# Patient Record
Sex: Male | Born: 1965 | Race: White | Hispanic: No | Marital: Single | Smoking: Current every day smoker
Health system: Southern US, Community
[De-identification: ages and names within clinical notes are randomized; demographics above are authoritative.]

## PROBLEM LIST (undated history)

## (undated) DIAGNOSIS — G40909 Epilepsy, unspecified, not intractable, without status epilepticus: Secondary | ICD-10-CM

## (undated) DIAGNOSIS — M542 Cervicalgia: Secondary | ICD-10-CM

## (undated) DIAGNOSIS — I10 Essential (primary) hypertension: Secondary | ICD-10-CM

## (undated) DIAGNOSIS — F329 Major depressive disorder, single episode, unspecified: Secondary | ICD-10-CM

## (undated) DIAGNOSIS — R569 Unspecified convulsions: Secondary | ICD-10-CM

## (undated) DIAGNOSIS — K649 Unspecified hemorrhoids: Secondary | ICD-10-CM

## (undated) DIAGNOSIS — M1990 Unspecified osteoarthritis, unspecified site: Secondary | ICD-10-CM

## (undated) DIAGNOSIS — G6181 Chronic inflammatory demyelinating polyneuritis: Secondary | ICD-10-CM

## (undated) DIAGNOSIS — E785 Hyperlipidemia, unspecified: Secondary | ICD-10-CM

## (undated) DIAGNOSIS — K219 Gastro-esophageal reflux disease without esophagitis: Secondary | ICD-10-CM

## (undated) DIAGNOSIS — K635 Polyp of colon: Secondary | ICD-10-CM

## (undated) DIAGNOSIS — Z8619 Personal history of other infectious and parasitic diseases: Secondary | ICD-10-CM

## (undated) DIAGNOSIS — K7460 Unspecified cirrhosis of liver: Secondary | ICD-10-CM

## (undated) DIAGNOSIS — E7800 Pure hypercholesterolemia, unspecified: Secondary | ICD-10-CM

## (undated) DIAGNOSIS — D126 Benign neoplasm of colon, unspecified: Secondary | ICD-10-CM

## (undated) DIAGNOSIS — F419 Anxiety disorder, unspecified: Secondary | ICD-10-CM

## (undated) DIAGNOSIS — F4310 Post-traumatic stress disorder, unspecified: Secondary | ICD-10-CM

## (undated) DIAGNOSIS — B880 Other acariasis: Secondary | ICD-10-CM

## (undated) DIAGNOSIS — K625 Hemorrhage of anus and rectum: Secondary | ICD-10-CM

## (undated) DIAGNOSIS — M549 Dorsalgia, unspecified: Secondary | ICD-10-CM

## (undated) DIAGNOSIS — F32A Depression, unspecified: Secondary | ICD-10-CM

## (undated) HISTORY — DX: Unspecified osteoarthritis, unspecified site: M19.90

## (undated) HISTORY — DX: Unspecified hemorrhoids: K64.9

## (undated) HISTORY — DX: Pure hypercholesterolemia, unspecified: E78.00

## (undated) HISTORY — PX: OTHER SURGICAL HISTORY: SHX169

## (undated) HISTORY — DX: Unspecified cirrhosis of liver: K74.60

## (undated) HISTORY — PX: HERNIA REPAIR: SHX51

## (undated) HISTORY — DX: Gastro-esophageal reflux disease without esophagitis: K21.9

## (undated) HISTORY — DX: Benign neoplasm of colon, unspecified: D12.6

## (undated) HISTORY — DX: Hyperlipidemia, unspecified: E78.5

## (undated) HISTORY — DX: Post-traumatic stress disorder, unspecified: F43.10

## (undated) HISTORY — DX: Anxiety disorder, unspecified: F41.9

## (undated) HISTORY — DX: Polyp of colon: K63.5

## (undated) HISTORY — PX: TOTAL KNEE ARTHROPLASTY: SHX125

## (undated) HISTORY — DX: Essential (primary) hypertension: I10

## (undated) HISTORY — PX: ESOPHAGOGASTRODUODENOSCOPY: SHX1529

## (undated) HISTORY — DX: Epilepsy, unspecified, not intractable, without status epilepticus: G40.909

## (undated) HISTORY — PX: KNEE ARTHROSCOPY: SUR90

## (undated) HISTORY — DX: Personal history of other infectious and parasitic diseases: Z86.19

## (undated) HISTORY — DX: Chronic inflammatory demyelinating polyneuritis: G61.81

---

## 2009-06-28 ENCOUNTER — Emergency Department (HOSPITAL_COMMUNITY): Admission: EM | Admit: 2009-06-28 | Discharge: 2009-06-29 | Payer: Self-pay | Admitting: Emergency Medicine

## 2010-06-10 LAB — POCT I-STAT, CHEM 8
BUN: 13 mg/dL (ref 6–23)
Chloride: 107 mEq/L (ref 96–112)
Creatinine, Ser: 0.8 mg/dL (ref 0.4–1.5)
Glucose, Bld: 104 mg/dL — ABNORMAL HIGH (ref 70–99)
Hemoglobin: 15.3 g/dL (ref 13.0–17.0)
Potassium: 3.7 mEq/L (ref 3.5–5.1)
Sodium: 140 mEq/L (ref 135–145)
TCO2: 22 mmol/L (ref 0–100)

## 2012-01-04 ENCOUNTER — Encounter (HOSPITAL_COMMUNITY): Payer: Self-pay

## 2012-01-04 ENCOUNTER — Emergency Department (HOSPITAL_COMMUNITY)
Admission: EM | Admit: 2012-01-04 | Discharge: 2012-01-04 | Disposition: A | Discharge status: Home / Self Care | Payer: Self-pay | Attending: Emergency Medicine | Admitting: Emergency Medicine

## 2012-01-04 DIAGNOSIS — Z885 Allergy status to narcotic agent status: Secondary | ICD-10-CM | POA: Insufficient documentation

## 2012-01-04 DIAGNOSIS — M542 Cervicalgia: Secondary | ICD-10-CM | POA: Insufficient documentation

## 2012-01-04 DIAGNOSIS — F17200 Nicotine dependence, unspecified, uncomplicated: Secondary | ICD-10-CM | POA: Insufficient documentation

## 2012-01-04 DIAGNOSIS — B1920 Unspecified viral hepatitis C without hepatic coma: Secondary | ICD-10-CM | POA: Insufficient documentation

## 2012-01-04 DIAGNOSIS — R569 Unspecified convulsions: Secondary | ICD-10-CM | POA: Insufficient documentation

## 2012-01-04 DIAGNOSIS — M549 Dorsalgia, unspecified: Secondary | ICD-10-CM | POA: Insufficient documentation

## 2012-01-04 DIAGNOSIS — F3289 Other specified depressive episodes: Secondary | ICD-10-CM | POA: Insufficient documentation

## 2012-01-04 DIAGNOSIS — W010XXA Fall on same level from slipping, tripping and stumbling without subsequent striking against object, initial encounter: Secondary | ICD-10-CM | POA: Insufficient documentation

## 2012-01-04 DIAGNOSIS — F329 Major depressive disorder, single episode, unspecified: Secondary | ICD-10-CM | POA: Insufficient documentation

## 2012-01-04 HISTORY — DX: Dorsalgia, unspecified: M54.9

## 2012-01-04 HISTORY — DX: Other acariasis: B88.0

## 2012-01-04 HISTORY — DX: Unspecified convulsions: R56.9

## 2012-01-04 HISTORY — DX: Depression, unspecified: F32.A

## 2012-01-04 HISTORY — DX: Major depressive disorder, single episode, unspecified: F32.9

## 2012-01-04 HISTORY — DX: Cervicalgia: M54.2

## 2012-01-04 MED ORDER — METHOCARBAMOL 500 MG PO TABS: 1000.0000 mg | ORAL_TABLET | Freq: Four times a day (QID) | ORAL | Status: DC

## 2012-01-04 MED ORDER — HYDROCODONE-ACETAMINOPHEN 5-325 MG PO TABS: ORAL_TABLET | ORAL | Status: DC

## 2012-01-04 MED ORDER — NAPROXEN 500 MG PO TABS: 500.0000 mg | ORAL_TABLET | Freq: Two times a day (BID) | ORAL | Status: DC

## 2012-01-04 NOTE — ED Notes (Signed)
Pt presents with NAD- fell off porch 1 week ago - HX of neck and back pain-  No LOC- No new pain- increased chronic pain.  Denies numbness and tingling- GCS 15

## 2012-01-04 NOTE — ED Provider Notes (Signed)
History     CSN: [id number]  Arrival date & time 01/04/12  0904   First MD Initiated Contact with Patient 01/04/12 [phone]      Chief Complaint  Patient presents with  . Neck Pain  . Back Pain  . Fall    (Consider location/radiation/quality/duration/timing/severity/associated sxs/prior treatment) HPI Comments: Patient with h/o low back and neck problems, presents after fall 1-2 weeks ago. He slipped on a wet porch and landed on his back. C/o worsening neck and back pain since the fall. Has been trying heating pad and icy hot. Denies all red flag s/s of low back pain. No neuro deficits. Hurts to move, stand. Patient is ambulatory. Onset acute. Course is constant. Movement/walking makes sx worse. Nothing makes pain better.   Patient is a 46 y.o. male presenting with neck pain, back pain, and fall. The history is provided by the patient.  Neck Pain  Pertinent negatives include no numbness and no weakness.  Back Pain  Pertinent negatives include no fever, no numbness and no weakness.  Fall Pertinent negatives include no fever, no numbness and no hematuria.    Past Medical History  Diagnosis Date  . Neck pain   . Back pain   . Other acariasis     Hep C  . Seizures   . Depression     Past Surgical History  Procedure Date  . Joint replacement   . Hernia repair     No family history on file.  History  Substance Use Topics  . Smoking status: Current Every Day Smoker  . Smokeless tobacco: Not on file  . Alcohol Use: Yes      Review of Systems  Constitutional: Negative for fever and unexpected weight change.  HENT: Positive for neck pain. Negative for neck stiffness.   Gastrointestinal: Negative for constipation.       Neg for fecal incontinence  Genitourinary: Negative for hematuria, flank pain and difficulty urinating.       Negative for urinary incontinence or retention  Musculoskeletal: Positive for back pain.  Neurological: Negative for weakness and numbness.       Negative for saddle paresthesias     Allergies  Codeine  Home Medications   Current Outpatient Rx  Name Route Sig Dispense Refill  . DIVALPROEX SODIUM 500 MG PO TBEC Oral Take 500 mg by mouth 3 (three) times daily.    Marland Kitchen GABAPENTIN 100 MG PO CAPS Oral Take 100 mg by mouth 3 (three) times daily.    . ADULT MULTIVITAMIN W/MINERALS CH Oral Take 1 tablet by mouth daily.    . SERTRALINE HCL 50 MG PO TABS Oral Take 50 mg by mouth daily.    Marland Kitchen VITAMIN B-1 250 MG PO TABS Oral Take 250 mg by mouth daily.      BP 136/94  Pulse 76  Temp 98.2 F (36.8 C) (Oral)  Resp 18  Ht 5\' 9"  (1.753 m)  Wt 174 lb (78.926 kg)  BMI 25.70 kg/m2  SpO2 100%  Physical Exam  Nursing note and vitals reviewed. Constitutional: He appears well-developed and well-nourished.  HENT:  Head: Normocephalic and atraumatic.  Eyes: Conjunctivae normal are normal.  Neck: Normal range of motion.  Abdominal: Soft. There is no tenderness. There is no CVA tenderness.  Musculoskeletal: Normal range of motion. He exhibits no tenderness.       Cervical back: He exhibits tenderness. He exhibits normal range of motion and no bony tenderness.       Thoracic back:  Normal.       Lumbar back: He exhibits tenderness. He exhibits normal range of motion and no bony tenderness.       Back:       No step-off noted with palpation of spine.   Neurological: He is alert. He has normal reflexes. No sensory deficit. He exhibits normal muscle tone. Gait normal.       5/5 strength in entire lower extremities bilaterally. No sensation deficit.   Skin: Skin is warm and dry.  Psychiatric: He has a normal mood and affect.    ED Course  Procedures (including critical care time)  Labs Reviewed - No data to display No results found.   1. Back pain   2. Neck pain     10:12 AM Patient seen and examined.   Vital signs reviewed and are as follows: Filed Vitals:   01/04/12 0920  BP: 136/94  Pulse: 76  Temp: 98.2 F (36.8 C)    Resp: 18   No red flag s/s of low back pain. Patient was counseled on back pain precautions and told to do activity as tolerated but do not lift, push, or pull heavy objects more than 10 pounds for the next week.  Patient counseled to use ice or heat on back for no longer than 15 minutes every hour.   Patient prescribed muscle relaxer and counseled on proper use of muscle relaxant medication.    Patient prescribed narcotic pain medicine and counseled on proper use of narcotic pain medications. Counseled not to combine this medication with others containing tylenol.   Urged patient not to drink alcohol, drive, or perform any other activities that requires focus while taking either of these medications.  Patient urged to follow-up with PCP if pain does not improve with treatment and rest or if pain becomes recurrent. Urged to return with worsening severe pain, loss of bowel or bladder control, trouble walking.   The patient verbalizes understanding and agrees with the plan.  MDM  Patient with back pain. No neurological deficits. Patient is ambulatory. No warning symptoms of back pain including: loss of bowel or bladder control, night sweats, waking from sleep with back pain, unexplained fevers or weight loss, h/o cancer, IVDU, recent trauma. No upper or lower ext weakness/paresthesias to suggest radiculopathy. No concern for cauda equina, epidural abscess, or other serious cause of back/neck pain. Conservative measures such as rest, ice/heat and pain medicine indicated with PCP follow-up if no improvement with conservative management.         Renne Crigler, [place] 01/04/12 [phone]

## 2012-01-06 NOTE — ED Provider Notes (Signed)
Medical screening examination/treatment/procedure(s) were performed by non-physician practitioner and as supervising physician I was immediately available for consultation/collaboration.  Jenia Klepper R. Tomeka Kantner, MD 01/06/12 2355 

## 2012-07-25 ENCOUNTER — Encounter (HOSPITAL_COMMUNITY): Payer: Self-pay | Admitting: Emergency Medicine

## 2012-07-25 ENCOUNTER — Emergency Department (HOSPITAL_COMMUNITY)
Admission: EM | Admit: 2012-07-25 | Discharge: 2012-07-25 | Disposition: A | Discharge status: Home / Self Care | Payer: Self-pay | Attending: Emergency Medicine | Admitting: Emergency Medicine

## 2012-07-25 DIAGNOSIS — F329 Major depressive disorder, single episode, unspecified: Secondary | ICD-10-CM | POA: Insufficient documentation

## 2012-07-25 DIAGNOSIS — M542 Cervicalgia: Secondary | ICD-10-CM | POA: Insufficient documentation

## 2012-07-25 DIAGNOSIS — S39012A Strain of muscle, fascia and tendon of lower back, initial encounter: Secondary | ICD-10-CM

## 2012-07-25 DIAGNOSIS — Y939 Activity, unspecified: Secondary | ICD-10-CM | POA: Insufficient documentation

## 2012-07-25 DIAGNOSIS — Z8619 Personal history of other infectious and parasitic diseases: Secondary | ICD-10-CM | POA: Insufficient documentation

## 2012-07-25 DIAGNOSIS — F3289 Other specified depressive episodes: Secondary | ICD-10-CM | POA: Insufficient documentation

## 2012-07-25 DIAGNOSIS — S335XXA Sprain of ligaments of lumbar spine, initial encounter: Secondary | ICD-10-CM | POA: Insufficient documentation

## 2012-07-25 DIAGNOSIS — G8929 Other chronic pain: Secondary | ICD-10-CM | POA: Insufficient documentation

## 2012-07-25 DIAGNOSIS — F17200 Nicotine dependence, unspecified, uncomplicated: Secondary | ICD-10-CM | POA: Insufficient documentation

## 2012-07-25 DIAGNOSIS — Z79899 Other long term (current) drug therapy: Secondary | ICD-10-CM | POA: Insufficient documentation

## 2012-07-25 DIAGNOSIS — X58XXXA Exposure to other specified factors, initial encounter: Secondary | ICD-10-CM | POA: Insufficient documentation

## 2012-07-25 DIAGNOSIS — Y929 Unspecified place or not applicable: Secondary | ICD-10-CM | POA: Insufficient documentation

## 2012-07-25 DIAGNOSIS — G40909 Epilepsy, unspecified, not intractable, without status epilepticus: Secondary | ICD-10-CM | POA: Insufficient documentation

## 2012-07-25 MED ORDER — METHOCARBAMOL 500 MG PO TABS
500.0000 mg | ORAL_TABLET | Freq: Once | ORAL | Status: AC
  Administered 2012-07-25: 500 mg via ORAL
  Filled 2012-07-25: qty 1

## 2012-07-25 MED ORDER — HYDROCODONE-ACETAMINOPHEN 5-325 MG PO TABS: 1.0000 | ORAL_TABLET | ORAL | Status: DC | PRN

## 2012-07-25 MED ORDER — IBUPROFEN 600 MG PO TABS: 600.0000 mg | ORAL_TABLET | Freq: Four times a day (QID) | ORAL | Status: DC | PRN

## 2012-07-25 MED ORDER — METHOCARBAMOL 500 MG PO TABS: 500.0000 mg | ORAL_TABLET | Freq: Two times a day (BID) | ORAL | Status: DC

## 2012-07-25 MED ORDER — KETOROLAC TROMETHAMINE 60 MG/2ML IM SOLN
60.0000 mg | Freq: Once | INTRAMUSCULAR | Status: AC
  Administered 2012-07-25: 60 mg via INTRAMUSCULAR
  Filled 2012-07-25: qty 2

## 2012-07-25 MED ORDER — HYDROCODONE-ACETAMINOPHEN 5-325 MG PO TABS
1.0000 | ORAL_TABLET | Freq: Once | ORAL | Status: AC
  Administered 2012-07-25: 1 via ORAL
  Filled 2012-07-25: qty 1

## 2012-07-25 NOTE — ED Notes (Signed)
Patient  Called complaining of nausea after eating hamberger. Patient was informed to try clear liquids for the next 6 hours.

## 2012-07-25 NOTE — ED Provider Notes (Signed)
History     CSN: [id number]  Arrival date & time 07/25/12  0817   First MD Initiated Contact with Patient 07/25/12 0820      Chief Complaint  Patient presents with  . Back Pain    (Consider location/radiation/quality/duration/timing/severity/associated sxs/prior treatment) HPI Pt with long history of low back and neck pain p/w acute aggravation of current chronic pain x 2-3 days. No known trauma. No new weakness, numbness, fever, chills, bowel or bladder incontinence, IVD use. Pain is worse with movement and palpation. States he is taking tylenol for the pain.  Past Medical History  Diagnosis Date  . Neck pain   . Back pain   . Other acariasis     Hep C  . Seizures   . Depression     Past Surgical History  Procedure Laterality Date  . Joint replacement    . Hernia repair      No family history on file.  History  Substance Use Topics  . Smoking status: Current Every Day Smoker -- 0.05 packs/day for 40 years  . Smokeless tobacco: Not on file  . Alcohol Use: Yes      Review of Systems  Constitutional: Negative for fever and chills.  HENT: Negative for neck pain.   Respiratory: Negative for shortness of breath.   Cardiovascular: Negative for chest pain.  Gastrointestinal: Negative for nausea, vomiting and abdominal pain.  Genitourinary: Negative for frequency, flank pain and difficulty urinating.  Musculoskeletal: Positive for myalgias and back pain.  Skin: Negative for rash and wound.  Neurological: Negative for dizziness, weakness, light-headedness, numbness and headaches.  All other systems reviewed and are negative.    Allergies  Codeine  Home Medications   Current Outpatient Rx  Name  Route  Sig  Dispense  Refill  . divalproex (DEPAKOTE ER) 500 MG 24 hr tablet   Oral   Take 500 mg by mouth 3 (three) times daily.         Marland Kitchen gabapentin (NEURONTIN) 300 MG capsule   Oral   Take 300 mg by mouth 3 (three) times daily.         . Multiple Vitamin  (MULTIVITAMIN WITH MINERALS) TABS   Oral   Take 1 tablet by mouth daily.         . sertraline (ZOLOFT) 100 MG tablet   Oral   Take 100 mg by mouth daily.         . Thiamine HCl (VITAMIN B-1) 250 MG tablet   Oral   Take 250 mg by mouth daily.         . traZODone (DESYREL) 100 MG tablet   Oral   Take 100 mg by mouth at bedtime as needed for sleep.         Marland Kitchen HYDROcodone-acetaminophen (NORCO) 5-325 MG per tablet   Oral   Take 1 tablet by mouth every 4 (four) hours as needed for pain.   10 tablet   0   . ibuprofen (ADVIL,MOTRIN) 600 MG tablet   Oral   Take 1 tablet (600 mg total) by mouth every 6 (six) hours as needed for pain.   30 tablet   0   . methocarbamol (ROBAXIN) 500 MG tablet   Oral   Take 1 tablet (500 mg total) by mouth 2 (two) times daily.   20 tablet   0     BP 118/83  Pulse 84  Temp(Src) 98.3 F (36.8 C) (Oral)  Resp 19  SpO2 98%  Physical  Exam  Nursing note and vitals reviewed. Constitutional: He is oriented to person, place, and time. He appears well-developed and well-nourished. No distress.  HENT:  Head: Normocephalic and atraumatic.  Mouth/Throat: Oropharynx is clear and moist.  Eyes: EOM are normal. Pupils are equal, round, and reactive to light.  Neck: Normal range of motion. Neck supple.  Cardiovascular: Normal rate and regular rhythm.   Pulmonary/Chest: Effort normal and breath sounds normal. No respiratory distress. He has no wheezes. He has no rales. He exhibits no tenderness.  Abdominal: Soft. Bowel sounds are normal. He exhibits no distension and no mass. There is no tenderness. There is no rebound and no guarding.  Musculoskeletal: Normal range of motion. He exhibits tenderness. He exhibits no edema.       Arms: Tenderness to palpation. No stepoff. No cervical midline tenderness. Negative straight leg raise  Neurological: He is alert and oriented to person, place, and time.  Skin: Skin is warm and dry. No rash noted. No  erythema.  Psychiatric: He has a normal mood and affect. His behavior is normal.    ED Course  Procedures (including critical care time)  Labs Reviewed - No data to display No results found.   1. Lumbar strain, initial encounter       MDM  No concerning finding on history or physical.         Loren Racer, MD 07/25/12 [phone]

## 2012-07-25 NOTE — ED Notes (Signed)
Patient reports that the day before yesterday when the pollen started he began coughing and felt like he "locked up," meaning his muscles tightened and his spine and pelvis began to hurt badly.

## 2012-10-10 ENCOUNTER — Emergency Department (INDEPENDENT_AMBULATORY_CARE_PROVIDER_SITE_OTHER)
Admission: EM | Admit: 2012-10-10 | Discharge: 2012-10-10 | Disposition: A | Discharge status: Home / Self Care | Payer: Self-pay | Source: Home / Self Care

## 2012-10-10 ENCOUNTER — Encounter (HOSPITAL_COMMUNITY): Payer: Self-pay | Admitting: *Deleted

## 2012-10-10 DIAGNOSIS — G8929 Other chronic pain: Secondary | ICD-10-CM

## 2012-10-10 DIAGNOSIS — R52 Pain, unspecified: Secondary | ICD-10-CM

## 2012-10-10 DIAGNOSIS — M549 Dorsalgia, unspecified: Secondary | ICD-10-CM

## 2012-10-10 MED ORDER — KETOROLAC TROMETHAMINE 10 MG PO TABS: 10.0000 mg | ORAL_TABLET | Freq: Four times a day (QID) | ORAL | Status: DC | PRN

## 2012-10-10 MED ORDER — CYCLOBENZAPRINE HCL 5 MG PO TABS: 5.0000 mg | ORAL_TABLET | Freq: Three times a day (TID) | ORAL | Status: DC | PRN
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
## 2012-10-10 NOTE — ED Notes (Signed)
Pt  Reports  Neck  And  Back  Pain      For  What  He  Describes    As     Years   Ago        He  denys  A  Recent injury       He       Reports     Seen in  Er    About  1  Month  Ago     For    For  He  Reports  Was  Told  Was  A  Muscle  Sprain       He  Is sitting  Upright on  Exam table    Speaking   In  Complete  sentances      He  Ambulated to  Room with a  Steady  Fluid  Gait

## 2012-10-31 ENCOUNTER — Ambulatory Visit: Payer: Self-pay | Attending: Family Medicine | Admitting: Internal Medicine

## 2012-10-31 VITALS — BP 142/96 | HR 80 | Temp 98.8°F | Ht 69.0 in | Wt 191.8 lb

## 2012-10-31 DIAGNOSIS — M542 Cervicalgia: Secondary | ICD-10-CM | POA: Insufficient documentation

## 2012-10-31 DIAGNOSIS — M545 Low back pain, unspecified: Secondary | ICD-10-CM | POA: Insufficient documentation

## 2012-10-31 DIAGNOSIS — G8929 Other chronic pain: Secondary | ICD-10-CM | POA: Insufficient documentation

## 2012-11-01 ENCOUNTER — Encounter: Payer: Self-pay | Admitting: Internal Medicine

## 2012-11-01 NOTE — Progress Notes (Signed)
Patient ID: Cody Delacruz, male   DOB: 1965-10-01, 47 y.o.   MRN: [id number]  CC: To establish care  HPI: Patient was seen in the clinic today to establish medical care, he complained of severe neck pain and low back pain she has been going on for years, did describe to the neck pain as cracking, and some sometimes he had to fix the neck by turning it sideways onto he hears the crack sound and then he feels relief. Low back pain has also been several years, has had many therapies in the past, no tingling or symptoms of nerve damage.  Past medical history includes seizure disorder, depression and hepatitis C. 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 He is being managed by psychiatrist, and had seen a chiropractor before.   Allergies  Allergen Reactions  . Codeine Other (See Comments)    Unknown childhood allergy   Past Medical History  Diagnosis Date  . Neck pain   . Back pain   . Other acariasis     Hep C  . Seizures   . Depression    Current Outpatient Prescriptions on File Prior to Visit  Medication Sig Dispense Refill  . divalproex (DEPAKOTE ER) 500 MG 24 hr tablet Take 500 mg by mouth 3 (three) times daily.      . Multiple Vitamin (MULTIVITAMIN WITH MINERALS) TABS Take 1 tablet by mouth daily.      . sertraline (ZOLOFT) 100 MG tablet Take 100 mg by mouth daily.      . Thiamine HCl (VITAMIN B-1) 250 MG tablet Take 250 mg by mouth daily.      . traZODone (DESYREL) 100 MG tablet Take 100 mg by mouth at bedtime as needed for sleep.      . cyclobenzaprine (FLEXERIL) 5 MG tablet Take 1 tablet (5 mg total) by mouth 3 (three) times daily as needed for muscle spasms.  30 tablet  0  . gabapentin (NEURONTIN) 300 MG capsule Take 300 mg by mouth 3 (three) times daily.      Marland Kitchen HYDROcodone-acetaminophen (NORCO) 5-325 MG per tablet Take 1 tablet by mouth every 4 (four) hours as  needed for pain.  10 tablet  0  . ibuprofen (ADVIL,MOTRIN) 600 MG tablet Take 1 tablet (600 mg total) by mouth every 6 (six) hours as needed for pain.  30 tablet  0  . ketorolac (TORADOL) 10 MG tablet Take 1 tablet (10 mg total) by mouth every 6 (six) hours as needed for pain.  30 tablet  0  . methocarbamol (ROBAXIN) 500 MG tablet Take 1 tablet (500 mg total) by mouth 2 (two) times daily.  20 tablet  0   No current facility-administered medications on file prior to visit.   Family History  Problem Relation Age of Onset  . Cancer Mother   . Cancer Father   . Cancer Sister   . Cancer Brother   . Cancer Maternal Grandmother    History   Social History  . Marital Status: Single    Spouse Name: N/A    Number of Children: N/A  . Years of Education: N/A   Occupational History  . Not on file.   Social History Main Topics  . Smoking status: Current Every Day Smoker -- 0.05 packs/day for 40 years  . Smokeless tobacco: Not on file  . Alcohol Use:  Yes  . Drug Use: No  . Sexual Activity: Not on file   Other Topics Concern  . Not on file   Social History Narrative  . No narrative on file    Review of Systems: Constitutional: Negative for fever, chills, diaphoresis, activity change, appetite change and fatigue. HENT: Negative for ear pain, nosebleeds, congestion, facial swelling, rhinorrhea, neck pain, neck stiffness and ear discharge.  Eyes: Negative for pain, discharge, redness, itching and visual disturbance. Respiratory: Negative for cough, choking, chest tightness, shortness of breath, wheezing and stridor.  Cardiovascular: Negative for chest pain, palpitations and leg swelling. Gastrointestinal: Negative for abdominal distention. Genitourinary: Negative for dysuria, urgency, frequency, hematuria, flank pain, decreased urine volume, difficulty urinating and dyspareunia.  Musculoskeletal: No joint swelling or gait problem. Neurological: Negative for dizziness, tremors, seizures,  syncope, facial asymmetry, speech difficulty, weakness, light-headedness, numbness and headaches.  Hematological: Negative for adenopathy. Does not bruise/bleed easily. Psychiatric/Behavioral: Negative for hallucinations, behavioral problems, confusion, dysphoric mood, decreased concentration and agitation.    Objective:   Filed Vitals:   10/31/12 1659  BP: 142/96  Pulse: 80  Temp: 98.8 F (37.1 C)    Physical Exam: Constitutional: Patient appears well-developed and well-nourished. No distress. HENT: Normocephalic, atraumatic, External right and left ear normal. Oropharynx is clear and moist.  Eyes: Conjunctivae and EOM are normal. PERRLA, no scleral icterus. Neck: Point tenderness on the nape of neck, reduced range of motion dur to pain. No JVD. No tracheal deviation. No thyromegaly. CVS: RRR, S1/S2 +, no murmurs, no gallops, no carotid bruit.  Pulmonary: Effort and breath sounds normal, no stridor, rhonchi, wheezes, rales.  Abdominal: Soft. BS +,  no distension, tenderness, rebound or guarding.  Musculoskeletal: Normal range of motion. No edema and no tenderness.  Lymphadenopathy: No lymphadenopathy noted, cervical, inguinal or axillary Neuro: Alert. Normal reflexes, muscle tone coordination. No cranial nerve deficit. Skin: Skin is warm and dry. No rash noted. Not diaphoretic. No erythema. No pallor. Psychiatric: Normal mood and affect. Behavior, judgment, thought content normal.  Lab Results  Component Value Date   HGB 15.3 06/28/2009   HCT 45.0 06/28/2009   Lab Results  Component Value Date   CREATININE 0.8 06/28/2009   BUN 13 06/28/2009   NA 140 06/28/2009   K 3.7 06/28/2009   CL 107 06/28/2009    No results found for this basename: HGBA1C   Lipid Panel  No results found for this basename: chol, trig, hdl, cholhdl, vldl, ldlcalc       Assessment and plan:   Patient Active Problem List   Diagnosis Date Noted  . Neck pain, chronic 10/31/2012  . Low back pain 10/31/2012    X-ray of the cervical spine X-ray of the lumbosacral spine  Patient to alternate pain medication already prescribed We'll review the x-ray report and subsequent management will depend on the findings Patient may need physical therapy Smoking cessation counseling was done today  Dempsy Damiano was given clear instructions to go to ER or return to the clinic if symptoms don't improve, worsen or new problems develop.  Zeev Bartok verbalized understanding.  Pepe Mineau was told to call to get lab results if hasn't heard anything in the next week.        Jeanann Lewandowsky, MD [facility] [place], [place] [phone]   11/01/2012, 9:04 AM

## 2012-11-14 ENCOUNTER — Ambulatory Visit (HOSPITAL_COMMUNITY)
Admission: RE | Admit: 2012-11-14 | Discharge: 2012-11-14 | Disposition: A | Discharge status: Home / Self Care | Payer: Self-pay | Source: Ambulatory Visit | Attending: Internal Medicine | Admitting: Internal Medicine

## 2012-11-14 DIAGNOSIS — G8929 Other chronic pain: Secondary | ICD-10-CM | POA: Insufficient documentation

## 2012-11-14 DIAGNOSIS — M545 Low back pain, unspecified: Secondary | ICD-10-CM | POA: Insufficient documentation

## 2012-11-14 DIAGNOSIS — M542 Cervicalgia: Secondary | ICD-10-CM | POA: Insufficient documentation

## 2012-12-01 ENCOUNTER — Encounter: Payer: Self-pay | Admitting: Internal Medicine

## 2012-12-01 ENCOUNTER — Ambulatory Visit: Payer: No Typology Code available for payment source | Attending: Internal Medicine | Admitting: Internal Medicine

## 2012-12-01 ENCOUNTER — Ambulatory Visit: Payer: No Typology Code available for payment source | Attending: Internal Medicine

## 2012-12-01 ENCOUNTER — Other Ambulatory Visit: Payer: Self-pay | Admitting: Internal Medicine

## 2012-12-01 VITALS — BP 152/96 | HR 78 | Temp 99.3°F | Resp 16 | Ht 69.69 in | Wt 192.0 lb

## 2012-12-01 DIAGNOSIS — M542 Cervicalgia: Secondary | ICD-10-CM | POA: Insufficient documentation

## 2012-12-01 DIAGNOSIS — M545 Low back pain, unspecified: Secondary | ICD-10-CM | POA: Insufficient documentation

## 2012-12-01 DIAGNOSIS — G8929 Other chronic pain: Secondary | ICD-10-CM

## 2012-12-01 MED ORDER — TRAZODONE HCL 100 MG PO TABS
100.0000 mg | ORAL_TABLET | Freq: Every day | ORAL | Status: DC
Start: 2012-12-01 — End: 2013-03-02

## 2012-12-01 MED ORDER — IBUPROFEN 600 MG PO TABS: 600.0000 mg | ORAL_TABLET | Freq: Four times a day (QID) | ORAL | Status: DC | PRN

## 2012-12-01 MED ORDER — IBUPROFEN 800 MG PO TABS: 800.0000 mg | ORAL_TABLET | Freq: Four times a day (QID) | ORAL | Status: DC | PRN

## 2012-12-01 MED ORDER — METHOCARBAMOL 500 MG PO TABS: 500.0000 mg | ORAL_TABLET | Freq: Two times a day (BID) | ORAL | Status: DC

## 2012-12-01 MED ORDER — CYCLOBENZAPRINE HCL 5 MG PO TABS: 5.0000 mg | ORAL_TABLET | Freq: Three times a day (TID) | ORAL | Status: DC | PRN

## 2012-12-01 NOTE — Progress Notes (Signed)
Patient ID: Cody Delacruz, male   DOB: [birth year], 47 y.o.   MRN: [id number] Patient Demographics  Cody Delacruz, is a 47 y.o. male  [id number]  [id number]  DOB - 1965-05-23  Chief Complaint  Patient presents with  . Follow-up        Subjective:   Cody Delacruz is a 47 y.o. male here today for a follow up visit. He wants to know the results of the x-rays and also to get a refill in view of his medications. No new complaints. Still has neck pain with a cracked sound. Patient has No headache, No chest pain, No abdominal pain - No Nausea, No new weakness tingling or numbness, No Cough - SOB.  ALLERGIES:   Allergies  Allergen Reactions  . Codeine Other (See Comments)    Unknown childhood allergy    PAST MEDICAL HISTORY: Past Medical History  Diagnosis Date  . Neck pain   . Back pain   . Other acariasis     Hep C  . Seizures   . Depression     MEDICATIONS AT HOME: Prior to Admission medications   Medication Sig Start Date End Date Taking? Authorizing Provider  divalproex (DEPAKOTE ER) 500 MG 24 hr tablet Take 500 mg by mouth 3 (three) times daily.   Yes Historical Provider, MD  sertraline (ZOLOFT) 100 MG tablet Take 100 mg by mouth daily.   Yes Historical Provider, MD  traZODone (DESYREL) 100 MG tablet Take 1 tablet (100 mg total) by mouth at bedtime. 12/01/12  Yes Jeanann Lewandowsky, MD  cyclobenzaprine (FLEXERIL) 5 MG tablet Take 1 tablet (5 mg total) by mouth 3 (three) times daily as needed for muscle spasms. 12/01/12   Jeanann Lewandowsky, MD  gabapentin (NEURONTIN) 300 MG capsule Take 300 mg by mouth 3 (three) times daily.    Historical Provider, MD  HYDROcodone-acetaminophen (NORCO) 5-325 MG per tablet Take 1 tablet by mouth every 4 (four) hours as needed for pain. 07/25/12   Loren Racer, MD  ibuprofen (ADVIL,MOTRIN) 600 MG tablet Take 1 tablet (600 mg total) by mouth every 6 (six) hours as needed for pain. 12/01/12   Jeanann Lewandowsky, MD  ketorolac (TORADOL) 10  MG tablet Take 1 tablet (10 mg total) by mouth every 6 (six) hours as needed for pain. 10/10/12   Linna Hoff, MD  methocarbamol (ROBAXIN) 500 MG tablet Take 1 tablet (500 mg total) by mouth 2 (two) times daily. 12/01/12   Jeanann Lewandowsky, MD  Multiple Vitamin (MULTIVITAMIN WITH MINERALS) TABS Take 1 tablet by mouth daily.    Historical Provider, MD  Thiamine HCl (VITAMIN B-1) 250 MG tablet Take 250 mg by mouth daily.    Historical Provider, MD     Objective:   Filed Vitals:   12/01/12 1627  BP: 152/96  Pulse: 78  Temp: 99.3 F (37.4 C)  TempSrc: Oral  Resp: 16  Height: 5' 9.69" (1.77 m)  Weight: 192 lb (87.091 kg)  SpO2: 97%    Exam General appearance :Awake, alert, not in any distress. Speech Clear. Not toxic Looking HEENT: Atraumatic and Normocephalic, pupils equally reactive to light and accomodation Neck: supple, no JVD. No cervical lymphadenopathy.  Chest:Good air entry bilaterally, no added sounds  CVS: S1 S2 regular, no murmurs.  Abdomen: Bowel sounds present, Non tender and not distended with no gaurding, rigidity or rebound. Extremities: B/L Lower Ext shows no edema, both legs are warm to touch Neurology: Awake alert, and oriented X 3, CN II-XII intact, Non focal  Skin:No Rash Wounds:N/A   Data Review   CBC No results found for this basename: WBC, HGB, HCT, PLT, MCV, MCH, MCHC, RDW, NEUTRABS, LYMPHSABS, MONOABS, EOSABS, BASOSABS, BANDABS, BANDSABD,  in the last 168 hours  Chemistries   No results found for this basename: NA, K, CL, CO2, GLUCOSE, BUN, CREATININE, GFRCGP, CALCIUM, MG, AST, ALT, ALKPHOS, BILITOT,  in the last 168 hours ------------------------------------------------------------------------------------------------------------------ No results found for this basename: HGBA1C,  in the last 72 hours ------------------------------------------------------------------------------------------------------------------ No results found for this basename:  CHOL, HDL, LDLCALC, TRIG, CHOLHDL, LDLDIRECT,  in the last 72 hours ------------------------------------------------------------------------------------------------------------------ No results found for this basename: TSH, T4TOTAL, FREET3, T3FREE, THYROIDAB,  in the last 72 hours ------------------------------------------------------------------------------------------------------------------ No results found for this basename: VITAMINB12, FOLATE, FERRITIN, TIBC, IRON, RETICCTPCT,  in the last 72 hours  Coagulation profile  No results found for this basename: INR, PROTIME,  in the last 168 hours    Assessment & Plan   Patient Active Problem List   Diagnosis Date Noted  . Neck pain, chronic 10/31/2012  . Low back pain 10/31/2012     Plan: X-ray cervical and lumbar spines reviewed and discussed with patient Patient was counseled about pain  Refilled trazodone, ibuprofen, Robaxin and Flexeril Patient claims to have other medications including anti-seizure Patient encouraged to have ambulatory blood pressure check and keep a log for next appointment as he claims his blood pressure is usually with normal except when in pain like now   Follow up in 2 months   The patient was given clear instructions to go to ER or return to medical center if symptoms don't improve, worsen or new problems develop. The patient verbalized understanding. The patient was told to call to get lab results if they haven't heard anything in the next week.    Jeanann Lewandowsky, MD, MHA, FACP [facility] [place], [place] [phone]   12/01/2012, 4:50 PM

## 2012-12-01 NOTE — Progress Notes (Signed)
Pt is here for a F/U visit. Pt is here to review his  X ray results Pt still has the neck and back pain. 8 on the pain scale, sharp, stabbing, no changes. Pt also request a physical.

## 2012-12-13 ENCOUNTER — Ambulatory Visit: Payer: No Typology Code available for payment source | Attending: Internal Medicine

## 2012-12-13 DIAGNOSIS — IMO0001 Reserved for inherently not codable concepts without codable children: Secondary | ICD-10-CM | POA: Insufficient documentation

## 2012-12-13 DIAGNOSIS — R293 Abnormal posture: Secondary | ICD-10-CM | POA: Insufficient documentation

## 2012-12-13 DIAGNOSIS — R5381 Other malaise: Secondary | ICD-10-CM | POA: Insufficient documentation

## 2012-12-13 DIAGNOSIS — M2550 Pain in unspecified joint: Secondary | ICD-10-CM | POA: Insufficient documentation

## 2012-12-13 DIAGNOSIS — M2560 Stiffness of unspecified joint, not elsewhere classified: Secondary | ICD-10-CM | POA: Insufficient documentation

## 2012-12-15 ENCOUNTER — Ambulatory Visit: Payer: No Typology Code available for payment source

## 2012-12-19 ENCOUNTER — Ambulatory Visit: Payer: No Typology Code available for payment source

## 2012-12-20 ENCOUNTER — Ambulatory Visit: Payer: No Typology Code available for payment source | Attending: Internal Medicine | Admitting: Rehabilitation

## 2012-12-20 DIAGNOSIS — R293 Abnormal posture: Secondary | ICD-10-CM | POA: Insufficient documentation

## 2012-12-20 DIAGNOSIS — M2550 Pain in unspecified joint: Secondary | ICD-10-CM | POA: Insufficient documentation

## 2012-12-20 DIAGNOSIS — M2560 Stiffness of unspecified joint, not elsewhere classified: Secondary | ICD-10-CM | POA: Insufficient documentation

## 2012-12-20 DIAGNOSIS — R5381 Other malaise: Secondary | ICD-10-CM | POA: Insufficient documentation

## 2012-12-20 DIAGNOSIS — IMO0001 Reserved for inherently not codable concepts without codable children: Secondary | ICD-10-CM | POA: Insufficient documentation

## 2012-12-26 ENCOUNTER — Ambulatory Visit: Payer: No Typology Code available for payment source

## 2012-12-27 ENCOUNTER — Ambulatory Visit: Payer: No Typology Code available for payment source

## 2013-01-03 ENCOUNTER — Ambulatory Visit: Payer: No Typology Code available for payment source

## 2013-01-05 ENCOUNTER — Ambulatory Visit: Payer: No Typology Code available for payment source

## 2013-01-09 ENCOUNTER — Ambulatory Visit: Payer: No Typology Code available for payment source | Admitting: Rehabilitation

## 2013-01-11 ENCOUNTER — Ambulatory Visit: Payer: No Typology Code available for payment source

## 2013-01-16 ENCOUNTER — Ambulatory Visit: Payer: No Typology Code available for payment source

## 2013-01-18 ENCOUNTER — Ambulatory Visit: Payer: No Typology Code available for payment source

## 2013-01-23 ENCOUNTER — Ambulatory Visit: Payer: No Typology Code available for payment source | Attending: Internal Medicine

## 2013-01-23 DIAGNOSIS — R293 Abnormal posture: Secondary | ICD-10-CM | POA: Insufficient documentation

## 2013-01-23 DIAGNOSIS — M2550 Pain in unspecified joint: Secondary | ICD-10-CM | POA: Insufficient documentation

## 2013-01-23 DIAGNOSIS — R5381 Other malaise: Secondary | ICD-10-CM | POA: Insufficient documentation

## 2013-01-23 DIAGNOSIS — M2560 Stiffness of unspecified joint, not elsewhere classified: Secondary | ICD-10-CM | POA: Insufficient documentation

## 2013-01-23 DIAGNOSIS — IMO0001 Reserved for inherently not codable concepts without codable children: Secondary | ICD-10-CM | POA: Insufficient documentation

## 2013-01-25 ENCOUNTER — Ambulatory Visit: Payer: No Typology Code available for payment source

## 2013-01-30 ENCOUNTER — Ambulatory Visit: Payer: No Typology Code available for payment source

## 2013-02-01 ENCOUNTER — Ambulatory Visit: Payer: No Typology Code available for payment source | Admitting: Rehabilitation

## 2013-02-07 ENCOUNTER — Ambulatory Visit: Payer: No Typology Code available for payment source

## 2013-02-09 ENCOUNTER — Ambulatory Visit: Payer: No Typology Code available for payment source

## 2013-03-02 ENCOUNTER — Ambulatory Visit: Payer: No Typology Code available for payment source | Attending: Internal Medicine | Admitting: Internal Medicine

## 2013-03-02 ENCOUNTER — Encounter: Payer: Self-pay | Admitting: Internal Medicine

## 2013-03-02 VITALS — BP 153/100 | HR 78 | Temp 98.1°F | Resp 18 | Ht 69.0 in | Wt 203.0 lb

## 2013-03-02 DIAGNOSIS — G8929 Other chronic pain: Secondary | ICD-10-CM

## 2013-03-02 DIAGNOSIS — K219 Gastro-esophageal reflux disease without esophagitis: Secondary | ICD-10-CM

## 2013-03-02 DIAGNOSIS — Z23 Encounter for immunization: Secondary | ICD-10-CM

## 2013-03-02 DIAGNOSIS — M545 Low back pain: Secondary | ICD-10-CM

## 2013-03-02 MED ORDER — TRAZODONE HCL 100 MG PO TABS
100.0000 mg | ORAL_TABLET | Freq: Every day | ORAL | Status: DC
Start: 2013-03-02 — End: 2016-05-19

## 2013-03-02 MED ORDER — GABAPENTIN 300 MG PO CAPS: 300.0000 mg | ORAL_CAPSULE | Freq: Three times a day (TID) | ORAL | Status: DC

## 2013-03-02 MED ORDER — DIVALPROEX SODIUM ER 500 MG PO TB24: 500.0000 mg | ORAL_TABLET | Freq: Three times a day (TID) | ORAL | Status: DC

## 2013-03-02 MED ORDER — KETOROLAC TROMETHAMINE 10 MG PO TABS: 10.0000 mg | ORAL_TABLET | Freq: Four times a day (QID) | ORAL | Status: DC | PRN

## 2013-03-02 MED ORDER — METHOCARBAMOL 500 MG PO TABS: 500.0000 mg | ORAL_TABLET | Freq: Two times a day (BID) | ORAL | Status: DC

## 2013-03-02 MED ORDER — OMEPRAZOLE 40 MG PO CPDR: 40.0000 mg | DELAYED_RELEASE_CAPSULE | Freq: Every day | ORAL | Status: DC

## 2013-03-02 NOTE — Progress Notes (Unsigned)
Pt here to f/u chronic lower back/neck pain Need medication refill Unable to afford $4.00 meds Chronic pain C/o vomiting intermit after eating and lying down x 4 mnths

## 2013-03-02 NOTE — Progress Notes (Unsigned)
Patient ID: Cody Delacruz, male   DOB: [birth year], 47 y.o.   MRN: [id number] Subjective: ***  Objective:  Vital signs in last 24 hours:  Filed Vitals:   03/02/13 1747  BP: 153/100  Pulse: 78  Temp: 98.1 F (36.7 C)  TempSrc: Oral  Resp: 18  Height: 5\' 9"  (1.753 m)  Weight: 203 lb (92.08 kg)  SpO2: 96%    Physical Exam:  General: Middle aged male in no acute distress. HEENT: no pallor, no icterus, moist oral mucosa,  Heart: Normal  s1 &s2  Regular rate and rhythm, without murmurs, rubs, gallops. Lungs: Clear to auscultation bilaterally. Abdomen: Soft, nontender, nondistended, positive bowel sounds. Extremities: tender to Palpation over cervical and lower lumbar area Neuro: Alert, awake, oriented x3, nonfocal.   Lab Results:  Basic Metabolic Panel:    Component Value Date/Time   NA 140 06/28/2009 2150   K 3.7 06/28/2009 2150   CL 107 06/28/2009 2150   BUN 13 06/28/2009 2150   CREATININE 0.8 06/28/2009 2150   GLUCOSE 104* 06/28/2009 2150   CBC:    Component Value Date/Time   HGB 15.3 06/28/2009 2150   HCT 45.0 06/28/2009 2150    No results found for this or any previous visit (from the past 240 hour(s)).  Studies/Results: No results found.  Medications: Scheduled Meds: Continuous Infusions: PRN Meds:.    Assessment/Plan:  Chronic neck and low back pain  Patient is and he worked with physical therapy. If continues to have pain. I will refill his pain medication prescriptions Encouraged on range of motion exercises.  GERD Patient reports history of gastritis and gastropathy with hiatal hernia as seen on his EGD report from 2011. Patient has symptoms all acid reflux and vomiting of yellowish liquid material in the night. He also feels sensation of food being stuck in the esophagus. History of H pylori that was treated in [place]  in 2011 Will prescribe him with omeprazole 40 mg daily. Refer to out patient GI Follow  up in 3 month  Cody Delacruz 03/02/2013, 6:14  PM

## 2013-03-05 ENCOUNTER — Ambulatory Visit: Payer: No Typology Code available for payment source | Attending: Internal Medicine

## 2013-04-20 ENCOUNTER — Telehealth: Payer: Self-pay | Admitting: Emergency Medicine

## 2013-04-20 NOTE — Telephone Encounter (Signed)
Pt called in c/o continued neck and back pain unrelieved by pain medication. States sx are worsening in left leg numbness and right leg constant pain with lying down radiating to groin area x 2weeks. Pt has been seen by PT Was told he may need MRI Please f/u

## 2013-04-30 ENCOUNTER — Telehealth: Payer: Self-pay | Admitting: Internal Medicine

## 2013-04-30 NOTE — Telephone Encounter (Signed)
Pt is calling in today having trouble with his legs; pt has mentioned some numbness and that this sensation is travelling; pt would like to have a nurse call him to advise him on wether or not to go to the ED; please f/u with pt

## 2013-05-01 ENCOUNTER — Encounter: Payer: Self-pay | Admitting: Internal Medicine

## 2013-05-01 ENCOUNTER — Ambulatory Visit: Payer: No Typology Code available for payment source | Attending: Internal Medicine | Admitting: Internal Medicine

## 2013-05-01 VITALS — BP 137/88 | HR 84 | Temp 98.4°F | Resp 16 | Ht 69.0 in | Wt 199.0 lb

## 2013-05-01 DIAGNOSIS — Z760 Encounter for issue of repeat prescription: Secondary | ICD-10-CM | POA: Insufficient documentation

## 2013-05-01 DIAGNOSIS — M542 Cervicalgia: Secondary | ICD-10-CM | POA: Insufficient documentation

## 2013-05-01 DIAGNOSIS — M545 Low back pain, unspecified: Secondary | ICD-10-CM

## 2013-05-01 DIAGNOSIS — R569 Unspecified convulsions: Secondary | ICD-10-CM | POA: Insufficient documentation

## 2013-05-01 DIAGNOSIS — M549 Dorsalgia, unspecified: Secondary | ICD-10-CM | POA: Insufficient documentation

## 2013-05-01 DIAGNOSIS — G40909 Epilepsy, unspecified, not intractable, without status epilepticus: Secondary | ICD-10-CM

## 2013-05-01 DIAGNOSIS — K219 Gastro-esophageal reflux disease without esophagitis: Secondary | ICD-10-CM

## 2013-05-01 MED ORDER — KETOROLAC TROMETHAMINE 30 MG/ML IJ SOLN
30.0000 mg | Freq: Once | INTRAMUSCULAR | Status: AC
  Administered 2013-05-01: 30 mg via INTRAMUSCULAR

## 2013-05-01 MED ORDER — TRAMADOL HCL 50 MG PO TABS: 50.0000 mg | ORAL_TABLET | Freq: Three times a day (TID) | ORAL | Status: DC | PRN

## 2013-05-01 MED ORDER — DIVALPROEX SODIUM ER 500 MG PO TB24: 500.0000 mg | ORAL_TABLET | Freq: Three times a day (TID) | ORAL | Status: DC

## 2013-05-01 NOTE — Progress Notes (Signed)
Patient ID: Cody Delacruz, male   DOB: [birth year], 48 y.o.   MRN: [id number]   Cody Delacruz, is a 48 y.o. male  [id number]  [id number]  DOB - 09-26-1965  Chief Complaint  Patient presents with  . Follow-up  . Back Pain  . Medication Refill        Subjective:   Cody Delacruz is a 48 y.o. male here today for a follow up visit. Patient is here today for low back pain that has been ongoing for a long time lately associated with some weakness and numbness of the lower limbs. He claims his leg sleep talking very easily and he had to jump up and down as if to improve circulation, sometimes he does not feel his left leg. He has no recent injury to the back, he denies any fall. No history of fecal or urinary incontinence, no change in bowel habit. Patient has history of seizure disorder on Depakote, he used to be on Dilantin but he had trouble maintaining the therapeutic range, at a time he developed toxicity and was admitted. He is compliant with Depakote, still has seizures occasionally. He needs to see a neurologist. Patient continues to smoke cigarette about half a pack per day, drinks alcohol patient. Patient has No headache, No chest pain, No abdominal pain - No Nausea, No new weakness tingling or numbness, No Cough - SOB.  Problem  Gerd (Gastroesophageal Reflux Disease)  Seizure Disorder    ALLERGIES: Allergies  Allergen Reactions  . Codeine Other (See Comments)    Unknown childhood allergy    PAST MEDICAL HISTORY: Past Medical History  Diagnosis Date  . Neck pain   . Back pain   . Other acariasis     Hep C  . Seizures   . Depression     MEDICATIONS AT HOME: Prior to Admission medications   Medication Sig Start Date End Date Taking? Authorizing Provider  gabapentin (NEURONTIN) 300 MG capsule Take 1 capsule (300 mg total) by mouth 3 (three) times daily. 03/02/13  Yes Nishant Dhungel, MD  omeprazole (PRILOSEC) 40 MG capsule Take 1 capsule (40 mg total) by mouth  daily. 03/02/13  Yes Nishant Dhungel, MD  sertraline (ZOLOFT) 100 MG tablet Take 100 mg by mouth daily.   Yes Historical Provider, MD  traZODone (DESYREL) 100 MG tablet Take 1 tablet (100 mg total) by mouth at bedtime. 03/02/13  Yes Nishant Dhungel, MD  divalproex (DEPAKOTE ER) 500 MG 24 hr tablet Take 1 tablet (500 mg total) by mouth 3 (three) times daily. 05/01/13   Angelica Chessman, MD  HYDROcodone-acetaminophen (NORCO) 5-325 MG per tablet Take 1 tablet by mouth every 4 (four) hours as needed for pain. 07/25/12   Julianne Rice, MD  ibuprofen (ADVIL,MOTRIN) 800 MG tablet Take 1 tablet (800 mg total) by mouth every 6 (six) hours as needed for pain. 12/01/12   Angelica Chessman, MD  methocarbamol (ROBAXIN) 500 MG tablet Take 1 tablet (500 mg total) by mouth 2 (two) times daily. 03/02/13   Nishant Dhungel, MD  Multiple Vitamin (MULTIVITAMIN WITH MINERALS) TABS Take 1 tablet by mouth daily.    Historical Provider, MD  Thiamine HCl (VITAMIN B-1) 250 MG tablet Take 250 mg by mouth daily.    Historical Provider, MD  traMADol (ULTRAM) 50 MG tablet Take 1 tablet (50 mg total) by mouth every 8 (eight) hours as needed. 05/01/13   Angelica Chessman, MD     Objective:   Filed Vitals:   05/01/13 1209  BP: 137/88  Pulse: 84  Temp: 98.4 F (36.9 C)  TempSrc: Oral  Resp: 16  Height: 5\' 9"  (1.753 m)  Weight: 199 lb (90.266 kg)  SpO2: 97%    Exam General appearance : Awake, alert, not in any distress. Speech Clear. Not toxic looking HEENT: Atraumatic and Normocephalic, pupils equally reactive to light and accomodation Neck: supple, no JVD. No cervical lymphadenopathy.  Chest:Good air entry bilaterally, no added sounds  CVS: S1 S2 regular, no murmurs.  Abdomen: Bowel sounds present, Non tender and not distended with no gaurding, rigidity or rebound. Extremities: B/L Lower Ext shows no edema, both legs are warm to touch Neurology: Awake alert, and oriented X 3, CN II-XII intact, Non focal Skin:No  Rash Wounds:N/A  Data Review  Assessment & Plan   1. Low back pain Prescribed - traMADol (ULTRAM) 50 MG tablet; Take 1 tablet (50 mg total) by mouth every 8 (eight) hours as needed.  Dispense: 60 tablet; Refill: 0  - ketorolac (TORADOL) 30 MG/ML injection 30 mg; Inject 1 mL (30 mg total) into the muscle once.  Because of the new numbness or weakness of the lower limits we'll obtain- MRI Lumbar Spine Wo Contrast; Future  2. GERD (gastroesophageal reflux disease) Continue omeprazole 40 mg capsule by mouth daily  3. Seizure disorder Refill - divalproex (DEPAKOTE ER) 500 MG 24 hr tablet; Take 1 tablet (500 mg total) by mouth 3 (three) times daily.  Dispense: 90 tablet; Refill: 0  Do not drive until cleared by neurologist  Patient was extensively counseled about nutrition and exercise  Return in about 3 months (around 07/29/2013), or if symptoms worsen or fail to improve, for Routine Follow Up.  The patient was given clear instructions to go to ER or return to medical center if symptoms don't improve, worsen or new problems develop. The patient verbalized understanding. The patient was told to call to get lab results if they haven't heard anything in the next week.    Angelica Chessman, MD, [place], [place], [place] and [place], [place]   05/01/2013, 12:19 PM

## 2013-05-01 NOTE — Progress Notes (Signed)
Pt here for f/u Chronic back pain  Need medication refill C/o lower back pain radiating to left leg with numb/tingling sensation

## 2013-05-01 NOTE — Telephone Encounter (Signed)
Pt was seen today by Dr. Doreene Burke for sx's

## 2013-05-01 NOTE — Patient Instructions (Signed)
Back Pain, Adult Low back pain is very common. About 1 in 5 people have back pain.The cause of low back pain is rarely dangerous. The pain often gets better over time.About half of people with a sudden onset of back pain feel better in just 2 weeks. About 8 in 10 people feel better by 6 weeks.  CAUSES Some common causes of back pain include:  Strain of the muscles or ligaments supporting the spine.  Wear and tear (degeneration) of the spinal discs.  Arthritis.  Direct injury to the back. DIAGNOSIS Most of the time, the direct cause of low back pain is not known.However, back pain can be treated effectively even when the exact cause of the pain is unknown.Answering your caregiver's questions about your overall health and symptoms is one of the most accurate ways to make sure the cause of your pain is not dangerous. If your caregiver needs more information, he or she may order lab work or imaging tests (X-rays or MRIs).However, even if imaging tests show changes in your back, this usually does not require surgery. HOME CARE INSTRUCTIONS For many people, back pain returns.Since low back pain is rarely dangerous, it is often a condition that people can learn to manageon their own.   Remain active. It is stressful on the back to sit or stand in one place. Do not sit, drive, or stand in one place for more than 30 minutes at a time. Take short walks on level surfaces as soon as pain allows.Try to increase the length of time you walk each day.  Do not stay in bed.Resting more than 1 or 2 days can delay your recovery.  Do not avoid exercise or work.Your body is made to move.It is not dangerous to be active, even though your back may hurt.Your back will likely heal faster if you return to being active before your pain is gone.  Pay attention to your body when you bend and lift. Many people have less discomfortwhen lifting if they bend their knees, keep the load close to their bodies,and  avoid twisting. Often, the most comfortable positions are those that put less stress on your recovering back.  Find a comfortable position to sleep. Use a firm mattress and lie on your side with your knees slightly bent. If you lie on your back, put a pillow under your knees.  Only take over-the-counter or prescription medicines as directed by your caregiver. Over-the-counter medicines to reduce pain and inflammation are often the most helpful.Your caregiver may prescribe muscle relaxant drugs.These medicines help dull your pain so you can more quickly return to your normal activities and healthy exercise.  Put ice on the injured area.  Put ice in a plastic bag.  Place a towel between your skin and the bag.  Leave the ice on for 15-20 minutes, 03-04 times a day for the first 2 to 3 days. After that, ice and heat may be alternated to reduce pain and spasms.  Ask your caregiver about trying back exercises and gentle massage. This may be of some benefit.  Avoid feeling anxious or stressed.Stress increases muscle tension and can worsen back pain.It is important to recognize when you are anxious or stressed and learn ways to manage it.Exercise is a great option. SEEK MEDICAL CARE IF:  You have pain that is not relieved with rest or medicine.  You have pain that does not improve in 1 week.  You have new symptoms.  You are generally not feeling well. SEEK   IMMEDIATE MEDICAL CARE IF:   You have pain that radiates from your back into your legs.  You develop new bowel or bladder control problems.  You have unusual weakness or numbness in your arms or legs.  You develop nausea or vomiting.  You develop abdominal pain.  You feel faint. Document Released: 03/08/2005 Document Revised: 09/07/2011 Document Reviewed: 07/27/2010 [employer] Patient Information 2014 [employer].  

## 2013-05-17 ENCOUNTER — Ambulatory Visit (HOSPITAL_COMMUNITY)
Admission: RE | Admit: 2013-05-17 | Discharge: 2013-05-17 | Disposition: A | Discharge status: Home / Self Care | Payer: No Typology Code available for payment source | Source: Ambulatory Visit | Attending: Internal Medicine | Admitting: Internal Medicine

## 2013-05-17 DIAGNOSIS — M545 Low back pain, unspecified: Secondary | ICD-10-CM

## 2013-05-17 DIAGNOSIS — M5126 Other intervertebral disc displacement, lumbar region: Secondary | ICD-10-CM | POA: Insufficient documentation

## 2013-05-18 ENCOUNTER — Telehealth: Payer: Self-pay | Admitting: *Deleted

## 2013-05-18 NOTE — Telephone Encounter (Signed)
Message copied by Joan Mayans on Fri May 18, 2013  3:43 PM ------      Message from: Angelica Chessman E      Created: Thu May 17, 2013  5:05 PM       Please inform patient that his MRI shows mild to moderate disc bulging at the L5 on S1(lumbar vertebrae). There is not likely to be any medical or surgical intervention at this time except for pain medication and physical exercise but we'll refer to neurosurgery for further options.            This is a non-urgent referral to neurosurgery ------

## 2013-05-18 NOTE — Telephone Encounter (Signed)
Message copied by Joan Mayans on Fri May 18, 2013 12:53 PM ------      Message from: Angelica Chessman E      Created: Thu May 17, 2013  5:05 PM       Please inform patient that his MRI shows mild to moderate disc bulging at the L5 on S1(lumbar vertebrae). There is not likely to be any medical or surgical intervention at this time except for pain medication and physical exercise but we'll refer to neurosurgery for further options.            This is a non-urgent referral to neurosurgery ------

## 2013-05-18 NOTE — Telephone Encounter (Signed)
I spoke to the pt and I informed him of his MRI results

## 2013-05-18 NOTE — Telephone Encounter (Signed)
Left message 1st attempt.  

## 2013-05-28 DIAGNOSIS — R768 Other specified abnormal immunological findings in serum: Secondary | ICD-10-CM | POA: Insufficient documentation

## 2013-05-31 ENCOUNTER — Ambulatory Visit: Payer: Self-pay | Admitting: Internal Medicine

## 2013-06-04 ENCOUNTER — Encounter: Payer: Self-pay | Admitting: *Deleted

## 2013-06-04 DIAGNOSIS — IMO0002 Reserved for concepts with insufficient information to code with codable children: Secondary | ICD-10-CM

## 2013-06-29 ENCOUNTER — Encounter: Payer: Self-pay | Admitting: *Deleted

## 2013-08-02 ENCOUNTER — Ambulatory Visit: Payer: No Typology Code available for payment source | Attending: Internal Medicine | Admitting: Internal Medicine

## 2013-08-02 ENCOUNTER — Encounter: Payer: Self-pay | Admitting: Internal Medicine

## 2013-08-02 VITALS — BP 142/92 | HR 79 | Temp 99.0°F | Resp 16 | Ht 69.0 in | Wt 196.0 lb

## 2013-08-02 DIAGNOSIS — M545 Low back pain, unspecified: Secondary | ICD-10-CM

## 2013-08-02 DIAGNOSIS — G40909 Epilepsy, unspecified, not intractable, without status epilepticus: Secondary | ICD-10-CM | POA: Insufficient documentation

## 2013-08-02 DIAGNOSIS — K219 Gastro-esophageal reflux disease without esophagitis: Secondary | ICD-10-CM | POA: Insufficient documentation

## 2013-08-02 DIAGNOSIS — F17200 Nicotine dependence, unspecified, uncomplicated: Secondary | ICD-10-CM | POA: Insufficient documentation

## 2013-08-02 DIAGNOSIS — M25519 Pain in unspecified shoulder: Secondary | ICD-10-CM | POA: Insufficient documentation

## 2013-08-02 DIAGNOSIS — B182 Chronic viral hepatitis C: Secondary | ICD-10-CM | POA: Insufficient documentation

## 2013-08-02 DIAGNOSIS — Z79899 Other long term (current) drug therapy: Secondary | ICD-10-CM | POA: Insufficient documentation

## 2013-08-02 DIAGNOSIS — F3289 Other specified depressive episodes: Secondary | ICD-10-CM | POA: Insufficient documentation

## 2013-08-02 DIAGNOSIS — F329 Major depressive disorder, single episode, unspecified: Secondary | ICD-10-CM | POA: Insufficient documentation

## 2013-08-02 MED ORDER — DIVALPROEX SODIUM ER 500 MG PO TB24: 500.0000 mg | ORAL_TABLET | Freq: Three times a day (TID) | ORAL | Status: DC

## 2013-08-02 MED ORDER — OMEPRAZOLE 40 MG PO CPDR: 40.0000 mg | DELAYED_RELEASE_CAPSULE | Freq: Every day | ORAL | Status: DC

## 2013-08-02 MED ORDER — ACETAMINOPHEN-CODEINE #3 300-30 MG PO TABS: 1.0000 | ORAL_TABLET | ORAL | Status: DC | PRN

## 2013-08-02 NOTE — Progress Notes (Signed)
Patient ID: Cody Delacruz, male   DOB: 01-05-1966, 48 y.o.   MRN: [id number]   Cody Delacruz, is a 48 y.o. male  [id number]  [id number]  DOB - 24-Jun-1965  Chief Complaint  Patient presents with  . Follow-up        Subjective:   Cody Delacruz is a 48 y.o. male here today for a follow up visit. Patient is known to have chronic low back pain , seizure disorder, and depression. He is here today for follow-up. His last MRI showed "Small to moderate L5-S1 disc bulge with right central annular fissure. No canal noted. Mild L5-S1 neural foraminal narrowing".  Patient was referred to neurosurgery for further evaluation and possible treatment but he was not able to afford the co-pay of $200. He continues to have pain of the low back, no urinary or fecal incontinence. He continues to smoke heavily about half a pack of cigarettes per day, drinks alcohol occasionally. Patient has No headache, No chest pain, No abdominal pain - No Nausea, No new weakness tingling or numbness, No Cough - SOB.  No problems updated.  ALLERGIES: Allergies  Allergen Reactions  . Codeine Other (See Comments)    Unknown childhood allergy    PAST MEDICAL HISTORY: Past Medical History  Diagnosis Date  . Neck pain   . Back pain   . Other acariasis     Hep C  . Seizures   . Depression     MEDICATIONS AT HOME: Prior to Admission medications   Medication Sig Start Date End Date Taking? Authorizing Provider  divalproex (DEPAKOTE ER) 500 MG 24 hr tablet Take 1 tablet (500 mg total) by mouth 3 (three) times daily. 08/02/13  Yes Angelica Chessman, MD  Multiple Vitamin (MULTIVITAMIN WITH MINERALS) TABS Take 1 tablet by mouth daily.   Yes Historical Provider, MD  omeprazole (PRILOSEC) 40 MG capsule Take 1 capsule (40 mg total) by mouth daily. 08/02/13  Yes Angelica Chessman, MD  propranolol (INDERAL) 20 MG tablet Take 20 mg by mouth 2 (two) times daily.   Yes Historical Provider, MD  sertraline (ZOLOFT) 100 MG  tablet Take 100 mg by mouth daily.   Yes Historical Provider, MD  traZODone (DESYREL) 100 MG tablet Take 1 tablet (100 mg total) by mouth at bedtime. 03/02/13  Yes Nishant Dhungel, MD  acetaminophen-codeine (TYLENOL #3) 300-30 MG per tablet Take 1 tablet by mouth every 4 (four) hours as needed. 08/02/13   Angelica Chessman, MD  ibuprofen (ADVIL,MOTRIN) 800 MG tablet Take 1 tablet (800 mg total) by mouth every 6 (six) hours as needed for pain. 12/01/12   Angelica Chessman, MD  methocarbamol (ROBAXIN) 500 MG tablet Take 1 tablet (500 mg total) by mouth 2 (two) times daily. 03/02/13   Nishant Dhungel, MD  Thiamine HCl (VITAMIN B-1) 250 MG tablet Take 250 mg by mouth daily.    Historical Provider, MD     Objective:   Filed Vitals:   08/02/13 1148  BP: 142/92  Pulse: 79  Temp: 99 F (37.2 C)  TempSrc: Oral  Resp: 16  Height: 5\' 9"  (1.753 m)  Weight: 196 lb (88.905 kg)  SpO2: 97%    Exam General appearance : Awake, alert, not in any distress. Speech Clear. Not toxic looking HEENT: Atraumatic and Normocephalic, pupils equally reactive to light and accomodation Neck: supple, no JVD. No cervical lymphadenopathy.  Chest:Good air entry bilaterally, no added sounds  CVS: S1 S2 regular, no murmurs.  Abdomen: Bowel sounds present, Non tender and not  distended with no gaurding, rigidity or rebound. Extremities: B/L Lower Ext shows no edema, both legs are warm to touch Neurology: Awake alert, and oriented X 3, CN II-XII intact, Non focal. Negative leg raising Skin:No Rash Wounds:N/A  Data Review No results found for this basename: HGBA1C     Assessment & Plan   1. Low back pain  - acetaminophen-codeine (TYLENOL #3) 300-30 MG per tablet; Take 1 tablet by mouth every 4 (four) hours as needed.  Dispense: 60 tablet; Refill: 0  2. GERD (gastroesophageal reflux disease)  - omeprazole (PRILOSEC) 40 MG capsule; Take 1 capsule (40 mg total) by mouth daily.  Dispense: 90 capsule; Refill: 3  3.  Seizure disorder  - divalproex (DEPAKOTE ER) 500 MG 24 hr tablet; Take 1 tablet (500 mg total) by mouth 3 (three) times daily.  Dispense: 90 tablet; Refill: 3  Patient was counseled extensively on nutrition and exercise Patient was again counseled on smoking cessation  Return in about 6 months (around 02/02/2014), or if symptoms worsen or fail to improve, for Follow up Pain and comorbidities, Follow up HTN.  The patient was given clear instructions to go to ER or return to medical center if symptoms don't improve, worsen or new problems develop. The patient verbalized understanding. The patient was told to call to get lab results if they haven't heard anything in the next week.   This note has been created with Surveyor, quantity. Any transcriptional errors are unintentional.    Angelica Chessman, MD, [place], [place], [place] and [facility] [place], [place]   08/02/2013, 12:21 PM

## 2013-08-02 NOTE — Progress Notes (Signed)
Pt is here following up on his chronic pain in lower back, hips and legs. Pt fell about a week ago and has severe pain in his shoulder

## 2013-08-02 NOTE — Patient Instructions (Signed)
Back Pain, Adult Low back pain is very common. About 1 in 5 people have back pain.The cause of low back pain is rarely dangerous. The pain often gets better over time.About half of people with a sudden onset of back pain feel better in just 2 weeks. About 8 in 10 people feel better by 6 weeks.  CAUSES Some common causes of back pain include:  Strain of the muscles or ligaments supporting the spine.  Wear and tear (degeneration) of the spinal discs.  Arthritis.  Direct injury to the back. DIAGNOSIS Most of the time, the direct cause of low back pain is not known.However, back pain can be treated effectively even when the exact cause of the pain is unknown.Answering your caregiver's questions about your overall health and symptoms is one of the most accurate ways to make sure the cause of your pain is not dangerous. If your caregiver needs more information, he or she may order lab work or imaging tests (X-rays or MRIs).However, even if imaging tests show changes in your back, this usually does not require surgery. HOME CARE INSTRUCTIONS For many people, back pain returns.Since low back pain is rarely dangerous, it is often a condition that people can learn to manageon their own.   Remain active. It is stressful on the back to sit or stand in one place. Do not sit, drive, or stand in one place for more than 30 minutes at a time. Take short walks on level surfaces as soon as pain allows.Try to increase the length of time you walk each day.  Do not stay in bed.Resting more than 1 or 2 days can delay your recovery.  Do not avoid exercise or work.Your body is made to move.It is not dangerous to be active, even though your back may hurt.Your back will likely heal faster if you return to being active before your pain is gone.  Pay attention to your body when you bend and lift. Many people have less discomfortwhen lifting if they bend their knees, keep the load close to their bodies,and  avoid twisting. Often, the most comfortable positions are those that put less stress on your recovering back.  Find a comfortable position to sleep. Use a firm mattress and lie on your side with your knees slightly bent. If you lie on your back, put a pillow under your knees.  Only take over-the-counter or prescription medicines as directed by your caregiver. Over-the-counter medicines to reduce pain and inflammation are often the most helpful.Your caregiver may prescribe muscle relaxant drugs.These medicines help dull your pain so you can more quickly return to your normal activities and healthy exercise.  Put ice on the injured area.  Put ice in a plastic bag.  Place a towel between your skin and the bag.  Leave the ice on for 15-20 minutes, 03-04 times a day for the first 2 to 3 days. After that, ice and heat may be alternated to reduce pain and spasms.  Ask your caregiver about trying back exercises and gentle massage. This may be of some benefit.  Avoid feeling anxious or stressed.Stress increases muscle tension and can worsen back pain.It is important to recognize when you are anxious or stressed and learn ways to manage it.Exercise is a great option. SEEK MEDICAL CARE IF:  You have pain that is not relieved with rest or medicine.  You have pain that does not improve in 1 week.  You have new symptoms.  You are generally not feeling well. SEEK   IMMEDIATE MEDICAL CARE IF:   You have pain that radiates from your back into your legs.  You develop new bowel or bladder control problems.  You have unusual weakness or numbness in your arms or legs.  You develop nausea or vomiting.  You develop abdominal pain.  You feel faint. Document Released: 03/08/2005 Document Revised: 09/07/2011 Document Reviewed: 07/27/2010 [facility] Patient Information 2014 [place], [place]. DASH Diet The DASH diet stands for "Dietary Approaches to Stop Hypertension." It is a healthy eating plan that  has been shown to reduce high blood pressure (hypertension) in as little as 14 days, while also possibly providing other significant health benefits. These other health benefits include reducing the risk of breast cancer after menopause and reducing the risk of type 2 diabetes, heart disease, colon cancer, and stroke. Health benefits also include weight loss and slowing kidney failure in patients with chronic kidney disease.  DIET GUIDELINES  Limit salt (sodium). Your diet should contain less than 1500 mg of sodium daily.  Limit refined or processed carbohydrates. Your diet should include mostly whole grains. Desserts and added sugars should be used sparingly.  Include small amounts of heart-healthy fats. These types of fats include nuts, oils, and tub margarine. Limit saturated and trans fats. These fats have been shown to be harmful in the body. CHOOSING FOODS  The following food groups are based on a 2000 calorie diet. See your Registered Dietitian for individual calorie needs. Grains and Grain Products (6 to 8 servings daily)  Eat More Often: Whole-wheat bread, brown rice, whole-grain or wheat pasta, quinoa, popcorn without added fat or salt (air popped).  Eat Less Often: White bread, white pasta, white rice, cornbread. Vegetables (4 to 5 servings daily)  Eat More Often: Fresh, frozen, and canned vegetables. Vegetables may be raw, steamed, roasted, or grilled with a minimal amount of fat.  Eat Less Often/Avoid: Creamed or fried vegetables. Vegetables in a cheese sauce. Fruit (4 to 5 servings daily)  Eat More Often: All fresh, canned (in natural juice), or frozen fruits. Dried fruits without added sugar. One hundred percent fruit juice ( cup [237 mL] daily).  Eat Less Often: Dried fruits with added sugar. Canned fruit in light or heavy syrup. [employer], Fish, and Poultry (2 servings or less daily. One serving is 3 to 4 oz [85-114 g]).  Eat More Often: Ninety percent or leaner ground  beef, tenderloin, sirloin. Round cuts of beef, chicken breast, [place] breast. All fish. Grill, bake, or broil your meat. Nothing should be fried.  Eat Less Often/Avoid: Fatty cuts of meat, [place], or chicken leg, thigh, or wing. Fried cuts of meat or fish. Dairy (2 to 3 servings)  Eat More Often: Low-fat or fat-free milk, low-fat plain or light yogurt, reduced-fat or part-skim cheese.  Eat Less Often/Avoid: Milk (whole, 2%).Whole milk yogurt. Full-fat cheeses. Nuts, Seeds, and Legumes (4 to 5 servings per week)  Eat More Often: All without added salt.  Eat Less Often/Avoid: Salted nuts and seeds, canned beans with added salt. Fats and Sweets (limited)  Eat More Often: Vegetable oils, tub margarines without trans fats, sugar-free gelatin. Mayonnaise and salad dressings.  Eat Less Often/Avoid: Coconut oils, palm oils, butter, stick margarine, cream, half and half, cookies, candy, pie. FOR MORE INFORMATION The Dash Diet Eating Plan: www.dashdiet.org Document Released: 02/25/2011 Document Revised: 05/31/2011 Document Reviewed: 02/25/2011 [facility] Patient Information 2014 [place], [place]. Hypertension As your heart beats, it forces blood through your arteries. This force is your blood pressure. If the  pressure is too high, it is called hypertension (HTN) or high blood pressure. HTN is dangerous because you may have it and not know it. High blood pressure may mean that your heart has to work harder to pump blood. Your arteries may be narrow or stiff. The extra work puts you at risk for heart disease, stroke, and other problems.  Blood pressure consists of two numbers, a higher number over a lower, 110/72, for example. It is stated as "110 over 72." The ideal is below 120 for the top number (systolic) and under 80 for the bottom (diastolic). Write down your blood pressure today. You should pay close attention to your blood pressure if you have certain conditions such as:  Heart failure.  Prior  heart attack.  Diabetes  Chronic kidney disease.  Prior stroke.  Multiple risk factors for heart disease. To see if you have HTN, your blood pressure should be measured while you are seated with your arm held at the level of the heart. It should be measured at least twice. A one-time elevated blood pressure reading (especially in the Emergency Department) does not mean that you need treatment. There may be conditions in which the blood pressure is different between your right and left arms. It is important to see your caregiver soon for a recheck. Most people have essential hypertension which means that there is not a specific cause. This type of high blood pressure may be lowered by changing lifestyle factors such as:  Stress.  Smoking.  Lack of exercise.  Excessive weight.  Drug/tobacco/alcohol use.  Eating less salt. Most people do not have symptoms from high blood pressure until it has caused damage to the body. Effective treatment can often prevent, delay or reduce that damage. TREATMENT  When a cause has been identified, treatment for high blood pressure is directed at the cause. There are a large number of medications to treat HTN. These fall into several categories, and your caregiver will help you select the medicines that are best for you. Medications may have side effects. You should review side effects with your caregiver. If your blood pressure stays high after you have made lifestyle changes or started on medicines,   Your medication(s) may need to be changed.  Other problems may need to be addressed.  Be certain you understand your prescriptions, and know how and when to take your medicine.  Be sure to follow up with your caregiver within the time frame advised (usually within two weeks) to have your blood pressure rechecked and to review your medications.  If you are taking more than one medicine to lower your blood pressure, make sure you know how and at what  times they should be taken. Taking two medicines at the same time can result in blood pressure that is too low. SEEK IMMEDIATE MEDICAL CARE IF:  You develop a severe headache, blurred or changing vision, or confusion.  You have unusual weakness or numbness, or a faint feeling.  You have severe chest or abdominal pain, vomiting, or breathing problems. MAKE SURE YOU:   Understand these instructions.  Will watch your condition.  Will get help right away if you are not doing well or get worse. Document Released: 03/08/2005 Document Revised: 05/31/2011 Document Reviewed: 10/27/2007 [facility] Patient Information 2014 [place].

## 2013-08-08 ENCOUNTER — Encounter: Payer: Self-pay | Admitting: Internal Medicine

## 2013-10-30 ENCOUNTER — Telehealth: Payer: Self-pay | Admitting: Internal Medicine

## 2013-10-30 NOTE — Telephone Encounter (Signed)
Pt calling for med refill on acetaminophen-codeine (TYLENOL #3) 300-30 MG per tablet.  Says he is still having back trouble and was told by Dr to call back if he needed more.  Pt does not like to take pain medication but pain is persistent.  Also pt wondering whether taking Tylenol #3 will interact with sertraline (ZOLOFT) 100 MG tablet.  Please f/u with pt.

## 2013-11-05 ENCOUNTER — Telehealth: Payer: Self-pay | Admitting: Emergency Medicine

## 2013-11-05 NOTE — Telephone Encounter (Signed)
Pt requesting medication refill Tylenol #3. Please f/u

## 2013-11-06 NOTE — Telephone Encounter (Signed)
Please contact pt.

## 2013-11-07 NOTE — Telephone Encounter (Signed)
Pt. Called back to check status of refill on acetaminophen-codeine (TYLENOL #3) 300-30 MG per tablet. Please f/u with pt.

## 2013-11-12 ENCOUNTER — Telehealth: Payer: Self-pay | Admitting: Internal Medicine

## 2013-11-12 NOTE — Telephone Encounter (Signed)
Pt. requesting medication refill Tylenol #3. Please f/u with pt.

## 2013-11-14 NOTE — Telephone Encounter (Signed)
Pt requesting medication Tylenol #3 refill Please f/u

## 2013-12-13 ENCOUNTER — Other Ambulatory Visit: Payer: Self-pay | Admitting: Emergency Medicine

## 2013-12-13 MED ORDER — ACETAMINOPHEN-CODEINE #3 300-30 MG PO TABS
1.0000 | ORAL_TABLET | ORAL | Status: DC | PRN
Start: 2013-12-13 — End: 2015-03-10

## 2013-12-17 ENCOUNTER — Ambulatory Visit: Payer: Self-pay | Admitting: Internal Medicine

## 2013-12-28 ENCOUNTER — Ambulatory Visit: Payer: Self-pay | Attending: Internal Medicine

## 2014-02-08 ENCOUNTER — Ambulatory Visit: Payer: Self-pay | Attending: Internal Medicine | Admitting: *Deleted

## 2014-02-08 ENCOUNTER — Ambulatory Visit (HOSPITAL_BASED_OUTPATIENT_CLINIC_OR_DEPARTMENT_OTHER): Payer: Self-pay | Admitting: *Deleted

## 2014-02-08 VITALS — BP 130/88 | HR 89 | Temp 98.5°F | Resp 16

## 2014-02-08 DIAGNOSIS — Z23 Encounter for immunization: Secondary | ICD-10-CM | POA: Insufficient documentation

## 2014-02-21 ENCOUNTER — Ambulatory Visit: Payer: Self-pay | Attending: Internal Medicine | Admitting: Internal Medicine

## 2014-02-21 ENCOUNTER — Encounter: Payer: Self-pay | Admitting: Internal Medicine

## 2014-02-21 VITALS — BP 138/93 | HR 73 | Temp 98.0°F | Resp 16 | Wt 193.0 lb

## 2014-02-21 DIAGNOSIS — M5117 Intervertebral disc disorders with radiculopathy, lumbosacral region: Secondary | ICD-10-CM | POA: Insufficient documentation

## 2014-02-21 DIAGNOSIS — F17210 Nicotine dependence, cigarettes, uncomplicated: Secondary | ICD-10-CM | POA: Insufficient documentation

## 2014-02-21 DIAGNOSIS — M479 Spondylosis, unspecified: Secondary | ICD-10-CM | POA: Insufficient documentation

## 2014-02-21 DIAGNOSIS — M5442 Lumbago with sciatica, left side: Secondary | ICD-10-CM

## 2014-02-21 DIAGNOSIS — Z79899 Other long term (current) drug therapy: Secondary | ICD-10-CM | POA: Insufficient documentation

## 2014-02-21 DIAGNOSIS — R569 Unspecified convulsions: Secondary | ICD-10-CM | POA: Insufficient documentation

## 2014-02-21 DIAGNOSIS — M542 Cervicalgia: Secondary | ICD-10-CM | POA: Insufficient documentation

## 2014-02-21 DIAGNOSIS — F329 Major depressive disorder, single episode, unspecified: Secondary | ICD-10-CM | POA: Insufficient documentation

## 2014-02-21 DIAGNOSIS — G8929 Other chronic pain: Secondary | ICD-10-CM | POA: Insufficient documentation

## 2014-02-21 NOTE — Progress Notes (Signed)
Patient here for follow up Complains of still having neck pain and right hand pain Pain in right hand has gotten progressively worse and slightly swollen

## 2014-02-21 NOTE — Patient Instructions (Signed)
Back Pain, Adult Low back pain is very common. About 1 in 5 people have back pain.The cause of low back pain is rarely dangerous. The pain often gets better over time.About half of people with a sudden onset of back pain feel better in just 2 weeks. About 8 in 10 people feel better by 6 weeks.  CAUSES Some common causes of back pain include:  Strain of the muscles or ligaments supporting the spine.  Wear and tear (degeneration) of the spinal discs.  Arthritis.  Direct injury to the back. DIAGNOSIS Most of the time, the direct cause of low back pain is not known.However, back pain can be treated effectively even when the exact cause of the pain is unknown.Answering your caregiver's questions about your overall health and symptoms is one of the most accurate ways to make sure the cause of your pain is not dangerous. If your caregiver needs more information, he or she may order lab work or imaging tests (X-rays or MRIs).However, even if imaging tests show changes in your back, this usually does not require surgery. HOME CARE INSTRUCTIONS For many people, back pain returns.Since low back pain is rarely dangerous, it is often a condition that people can learn to manageon their own.   Remain active. It is stressful on the back to sit or stand in one place. Do not sit, drive, or stand in one place for more than 30 minutes at a time. Take short walks on level surfaces as soon as pain allows.Try to increase the length of time you walk each day.  Do not stay in bed.Resting more than 1 or 2 days can delay your recovery.  Do not avoid exercise or work.Your body is made to move.It is not dangerous to be active, even though your back may hurt.Your back will likely heal faster if you return to being active before your pain is gone.  Pay attention to your body when you bend and lift. Many people have less discomfortwhen lifting if they bend their knees, keep the load close to their bodies,and  avoid twisting. Often, the most comfortable positions are those that put less stress on your recovering back.  Find a comfortable position to sleep. Use a firm mattress and lie on your side with your knees slightly bent. If you lie on your back, put a pillow under your knees.  Only take over-the-counter or prescription medicines as directed by your caregiver. Over-the-counter medicines to reduce pain and inflammation are often the most helpful.Your caregiver may prescribe muscle relaxant drugs.These medicines help dull your pain so you can more quickly return to your normal activities and healthy exercise.  Put ice on the injured area.  Put ice in a plastic bag.  Place a towel between your skin and the bag.  Leave the ice on for 15-20 minutes, 03-04 times a day for the first 2 to 3 days. After that, ice and heat may be alternated to reduce pain and spasms.  Ask your caregiver about trying back exercises and gentle massage. This may be of some benefit.  Avoid feeling anxious or stressed.Stress increases muscle tension and can worsen back pain.It is important to recognize when you are anxious or stressed and learn ways to manage it.Exercise is a great option. SEEK MEDICAL CARE IF:  You have pain that is not relieved with rest or medicine.  You have pain that does not improve in 1 week.  You have new symptoms.  You are generally not feeling well. SEEK   IMMEDIATE MEDICAL CARE IF:   You have pain that radiates from your back into your legs.  You develop new bowel or bladder control problems.  You have unusual weakness or numbness in your arms or legs.  You develop nausea or vomiting.  You develop abdominal pain.  You feel faint. Document Released: 03/08/2005 Document Revised: 09/07/2011 Document Reviewed: 07/10/2013 [employer] Patient Information 2015 [employer]. This information is not intended to replace advice given to you by your health care provider. Make sure you  discuss any questions you have with your health care provider.  

## 2014-02-21 NOTE — Progress Notes (Signed)
Patient ID: Cody Delacruz, male   DOB: 07/02/1965, 48 y.o.   MRN: [id number]   Cody Delacruz, is a 48 y.o. male  QMG:[id number]  [id number]  DOB - [birth year]  Chief Complaint  Patient presents with  . Follow-up        Subjective:   Cody Delacruz is a 48 y.o. male here today for a follow up visit. Patient is known to have chronic low back pain , seizure disorder, and depression. He is here today for follow-up. He is complaining of ongoing neck pain and right hand pain. He said this pain in the right hand is progressively getting worse and slightly swollen. His last lumbar spine MRI showed "Small to moderate L5-S1 disc bulge with right central annular fissure. No canal noted. Mild L5-S1 neural foraminal narrowing".His cervical spine x-ray showed cervical degenerative changes with mild left foraminal narrowing at C3-C4 due to spurring. Patient was referred to neurosurgery for further evaluation and possible treatment but he was not able to afford the co-pay of $200. He continues to have pain of the low back, no urinary or fecal incontinence. He continues to smoke heavily about half a pack of cigarettes per day, drinks alcohol occasionally. Patient has No headache, No chest pain, No abdominal pain - No Nausea, No new weakness tingling or numbness, No Cough - SOB.  Problem  Bilateral Low Back Pain With Left-Sided Sciatica    ALLERGIES: Allergies  Allergen Reactions  . Codeine Other (See Comments)    Unknown childhood allergy    PAST MEDICAL HISTORY: Past Medical History  Diagnosis Date  . Neck pain   . Back pain   . Other acariasis     Hep C  . Seizures   . Depression     MEDICATIONS AT HOME: Prior to Admission medications   Medication Sig Start Date End Date Taking? Authorizing Provider  acetaminophen-codeine (TYLENOL #3) 300-30 MG per tablet Take 1 tablet by mouth every 4 (four) hours as needed. 12/13/13   Tresa Garter, MD  divalproex (DEPAKOTE ER) 500 MG 24 hr  tablet Take 1 tablet (500 mg total) by mouth 3 (three) times daily. 08/02/13   Tresa Garter, MD  ibuprofen (ADVIL,MOTRIN) 800 MG tablet Take 1 tablet (800 mg total) by mouth every 6 (six) hours as needed for pain. 12/01/12   Tresa Garter, MD  methocarbamol (ROBAXIN) 500 MG tablet Take 1 tablet (500 mg total) by mouth 2 (two) times daily. 03/02/13   Nishant Dhungel, MD  Multiple Vitamin (MULTIVITAMIN WITH MINERALS) TABS Take 1 tablet by mouth daily.    Historical Provider, MD  omeprazole (PRILOSEC) 40 MG capsule Take 1 capsule (40 mg total) by mouth daily. 08/02/13   Tresa Garter, MD  propranolol (INDERAL) 20 MG tablet Take 20 mg by mouth 2 (two) times daily.    Historical Provider, MD  sertraline (ZOLOFT) 100 MG tablet Take 100 mg by mouth daily.    Historical Provider, MD  Thiamine HCl (VITAMIN B-1) 250 MG tablet Take 250 mg by mouth daily.    Historical Provider, MD  traZODone (DESYREL) 100 MG tablet Take 1 tablet (100 mg total) by mouth at bedtime. 03/02/13   Nishant Dhungel, MD     Objective:   Filed Vitals:   02/21/14 1058  BP: 138/93  Pulse: 73  Temp: 98 F (36.7 C)  Resp: 16  Weight: 193 lb (87.544 kg)  SpO2: 100%    Exam General appearance : Awake, alert, not in any distress.  Speech Clear. Not toxic looking HEENT: Atraumatic and Normocephalic, pupils equally reactive to light and accomodation Neck: supple, no JVD. No cervical lymphadenopathy.  Chest:Good air entry bilaterally, no added sounds  CVS: S1 S2 regular, no murmurs.  Abdomen: Bowel sounds present, Non tender and not distended with no gaurding, rigidity or rebound. Extremities: B/L Lower Ext shows no edema, both legs are warm to touch Neurology: Awake alert, and oriented X 3, CN II-XII intact, Non focal Skin:No Rash Wounds:N/A  Data Review No results found for: HGBA1C   Assessment & Plan   1. Bilateral low back pain with left-sided sciatica  - Ambulatory referral to Pain Clinic - Pain  medications including Tylenol No. 3 and tramadol were offered to patient, but he declined saying he would rather get Percocet or any other "strong" medication from the pain clinic.  Patient was extensively counseled about pain, as well as nutrition and exercise as tolerated.   Return in about 6 months (around 08/23/2014), or if symptoms worsen or fail to improve, for Follow up Pain and comorbidities.  The patient was given clear instructions to go to ER or return to medical center if symptoms don't improve, worsen or new problems develop. The patient verbalized understanding. The patient was told to call to get lab results if they haven't heard anything in the next week.   This note has been created with Surveyor, quantity. Any transcriptional errors are unintentional.    Angelica Chessman, MD, [place], [place], [place] and [place], [place]   02/21/2014, 11:47 AM

## 2014-03-19 ENCOUNTER — Telehealth: Payer: Self-pay | Admitting: Internal Medicine

## 2014-03-19 NOTE — Telephone Encounter (Signed)
Patient is calling to request some medication, patient states that the pain management clinic isn't accepting new patients until April of 2016 because of the type of coverage he has and he is not able to wait that long and deal with his pain. Patient would like to speak to his PCP, or nurse about what to do next in regards to his medications. Please f/u with pt.

## 2014-07-15 ENCOUNTER — Other Ambulatory Visit: Payer: Self-pay | Admitting: Internal Medicine

## 2014-07-15 DIAGNOSIS — E119 Type 2 diabetes mellitus without complications: Secondary | ICD-10-CM

## 2014-07-16 ENCOUNTER — Telehealth: Payer: Self-pay | Admitting: Internal Medicine

## 2014-07-16 NOTE — Telephone Encounter (Signed)
Please fax lab results to [phone] attn: Madilyn Hook MD

## 2014-07-29 ENCOUNTER — Ambulatory Visit: Payer: Self-pay | Attending: Internal Medicine

## 2014-07-29 DIAGNOSIS — E119 Type 2 diabetes mellitus without complications: Secondary | ICD-10-CM

## 2014-07-29 LAB — CBC WITH DIFFERENTIAL/PLATELET
BASOS ABS: 0 10*3/uL (ref 0.0–0.1)
BASOS PCT: 0 % (ref 0–1)
Eosinophils Absolute: 0.1 10*3/uL (ref 0.0–0.7)
Eosinophils Relative: 1 % (ref 0–5)
HCT: 46.2 % (ref 39.0–52.0)
Hemoglobin: 15.9 g/dL (ref 13.0–17.0)
Lymphocytes Relative: 29 % (ref 12–46)
Lymphs Abs: 2.2 10*3/uL (ref 0.7–4.0)
MCH: 32.4 pg (ref 26.0–34.0)
MCHC: 34.4 g/dL (ref 30.0–36.0)
MCV: 94.3 fL (ref 78.0–100.0)
MPV: 9.3 fL (ref 8.6–12.4)
Monocytes Absolute: 0.8 10*3/uL (ref 0.1–1.0)
Monocytes Relative: 10 % (ref 3–12)
NEUTROS PCT: 60 % (ref 43–77)
Neutro Abs: 4.6 10*3/uL (ref 1.7–7.7)
PLATELETS: 160 10*3/uL (ref 150–400)
RBC: 4.9 MIL/uL (ref 4.22–5.81)
RDW: 13.4 % (ref 11.5–15.5)
WBC: 7.6 10*3/uL (ref 4.0–10.5)

## 2014-07-29 LAB — COMPLETE METABOLIC PANEL WITH GFR
ALBUMIN: 4.9 g/dL (ref 3.5–5.2)
ALT: 15 U/L (ref 0–53)
AST: 18 U/L (ref 0–37)
Alkaline Phosphatase: 65 U/L (ref 39–117)
BUN: 11 mg/dL (ref 6–23)
CALCIUM: 9.8 mg/dL (ref 8.4–10.5)
CHLORIDE: 100 meq/L (ref 96–112)
CO2: 26 mEq/L (ref 19–32)
CREATININE: 1.1 mg/dL (ref 0.50–1.35)
GFR, Est Non African American: 78 mL/min
GLUCOSE: 89 mg/dL (ref 70–99)
POTASSIUM: 5 meq/L (ref 3.5–5.3)
Sodium: 139 mEq/L (ref 135–145)
Total Bilirubin: 0.5 mg/dL (ref 0.2–1.2)
Total Protein: 7.8 g/dL (ref 6.0–8.3)

## 2014-07-29 LAB — LIPID PANEL
Cholesterol: 322 mg/dL — ABNORMAL HIGH (ref 0–200)
HDL: 72 mg/dL (ref >=40)
LDL CALC: 194 mg/dL — AB (ref 0–99)
Total CHOL/HDL Ratio: 4.5 Ratio
Triglycerides: 280 mg/dL — ABNORMAL HIGH (ref <150)
VLDL: 56 mg/dL — ABNORMAL HIGH (ref 0–40)

## 2014-07-29 LAB — TSH: TSH: 1.569 u[IU]/mL (ref 0.350–4.500)

## 2014-07-30 LAB — VALPROIC ACID LEVEL: VALPROIC ACID LVL: 120.2 ug/mL — AB (ref 50.0–100.0)

## 2014-08-13 ENCOUNTER — Other Ambulatory Visit: Payer: Self-pay | Admitting: *Deleted

## 2014-08-13 DIAGNOSIS — G40909 Epilepsy, unspecified, not intractable, without status epilepticus: Secondary | ICD-10-CM

## 2014-08-13 MED ORDER — DIVALPROEX SODIUM ER 500 MG PO TB24: 500.0000 mg | ORAL_TABLET | Freq: Three times a day (TID) | ORAL | Status: DC

## 2014-08-13 NOTE — Telephone Encounter (Signed)
Pharmacy called asking for refill on patient's Depakote. Patient is due appointment in June.  Will refill 1 month supply and patient told to make appointment.

## 2014-08-14 ENCOUNTER — Telehealth: Payer: Self-pay | Admitting: *Deleted

## 2014-08-14 NOTE — Telephone Encounter (Signed)
-----   Message from Tresa Garter, MD sent at 08/07/2014  5:24 PM EDT ----- Please inform patient that his laboratory test results are normal except for his cholesterol level that is very high and his Depakote level is also high. First we will need to start him on medication for cholesterol and also to address this please limit saturated fat to no more than 7% of your calories, limit cholesterol to 200 mg/day, increase fiber and exercise as tolerated. If needed we may add another cholesterol lowering medication to your regimen. Secondly, make appointment for follow-up and review of medications, lastly, hold Depakote for 3 days and then start at usual dose, we will recheck the level of the clinic visit.  Please call in prescription atorvastatin 40 mg tablet by mouth daily, 30 tablets with 3 refills.

## 2014-08-14 NOTE — Telephone Encounter (Signed)
Left HIPAA compliant message on patient's cell to return my call.

## 2014-08-15 ENCOUNTER — Telehealth: Payer: Self-pay | Admitting: *Deleted

## 2014-08-15 NOTE — Telephone Encounter (Signed)
Gave test results to patient and plan about Depakote.  Patient verbalized understanding and has appointment scheduled for first part of June.

## 2014-08-26 ENCOUNTER — Encounter: Payer: Self-pay | Admitting: Internal Medicine

## 2014-08-26 ENCOUNTER — Ambulatory Visit: Payer: Medicaid Other | Attending: Internal Medicine | Admitting: Internal Medicine

## 2014-08-26 VITALS — BP 125/86 | HR 73 | Temp 98.1°F | Ht 69.0 in | Wt 206.0 lb

## 2014-08-26 DIAGNOSIS — M5442 Lumbago with sciatica, left side: Secondary | ICD-10-CM | POA: Diagnosis not present

## 2014-08-26 DIAGNOSIS — G40909 Epilepsy, unspecified, not intractable, without status epilepticus: Secondary | ICD-10-CM | POA: Diagnosis not present

## 2014-08-26 DIAGNOSIS — E785 Hyperlipidemia, unspecified: Secondary | ICD-10-CM | POA: Diagnosis not present

## 2014-08-26 DIAGNOSIS — Z79899 Other long term (current) drug therapy: Secondary | ICD-10-CM | POA: Insufficient documentation

## 2014-08-26 DIAGNOSIS — F17200 Nicotine dependence, unspecified, uncomplicated: Secondary | ICD-10-CM | POA: Insufficient documentation

## 2014-08-26 DIAGNOSIS — F329 Major depressive disorder, single episode, unspecified: Secondary | ICD-10-CM | POA: Diagnosis not present

## 2014-08-26 MED ORDER — PRAVASTATIN SODIUM 40 MG PO TABS: 40.0000 mg | ORAL_TABLET | Freq: Every day | ORAL | Status: DC

## 2014-08-26 NOTE — Patient Instructions (Signed)
Food Choices to Lower Your Triglycerides  Triglycerides are a type of fat in your blood. High levels of triglycerides can increase the risk of heart disease and stroke. If your triglyceride levels are high, the foods you eat and your eating habits are very important. Choosing the right foods can help lower your triglycerides.  WHAT GENERAL GUIDELINES DO I NEED TO FOLLOW?  Lose weight if you are overweight.   Limit or avoid alcohol.   Fill one half of your plate with vegetables and green salads.   Limit fruit to two servings a day. Choose fruit instead of juice.   Make one fourth of your plate whole grains. Look for the word "whole" as the first word in the ingredient list.  Fill one fourth of your plate with lean protein foods.  Enjoy fatty fish (such as salmon, mackerel, sardines, and tuna) three times a week.   Choose healthy fats.   Limit foods high in starch and sugar.  Eat more home-cooked food and less restaurant, buffet, and fast food.  Limit fried foods.  Cook foods using methods other than frying.  Limit saturated fats.  Check ingredient lists to avoid foods with partially hydrogenated oils (trans fats) in them. WHAT FOODS CAN I EAT?  Grains Whole grains, such as whole wheat or whole grain breads, crackers, cereals, and pasta. Unsweetened oatmeal, bulgur, barley, quinoa, or brown rice. Corn or whole wheat flour tortillas.  Vegetables Fresh or frozen vegetables (raw, steamed, roasted, or grilled). Green salads. Fruits All fresh, canned (in natural juice), or frozen fruits. Meat and Other Protein Products Ground beef (85% or leaner), grass-fed beef, or beef trimmed of fat. Skinless chicken or turkey. Ground chicken or turkey. Pork trimmed of fat. All fish and seafood. Eggs. Dried beans, peas, or lentils. Unsalted nuts or seeds. Unsalted canned or dry beans. Dairy Low-fat dairy products, such as skim or 1% milk, 2% or reduced-fat cheeses, low-fat ricotta or  cottage cheese, or plain low-fat yogurt. Fats and Oils Tub margarines without trans fats. Light or reduced-fat mayonnaise and salad dressings. Avocado. Safflower, olive, or canola oils. Natural peanut or almond butter. The items listed above may not be a complete list of recommended foods or beverages. Contact your dietitian for more options. WHAT FOODS ARE NOT RECOMMENDED?  Grains White bread. White pasta. White rice. Cornbread. Bagels, pastries, and croissants. Crackers that contain trans fat. Vegetables White potatoes. Corn. Creamed or fried vegetables. Vegetables in a cheese sauce. Fruits Dried fruits. Canned fruit in light or heavy syrup. Fruit juice. Meat and Other Protein Products Fatty cuts of meat. Ribs, chicken wings, bacon, sausage, bologna, salami, chitterlings, fatback, hot dogs, bratwurst, and packaged luncheon meats. Dairy Whole or 2% milk, cream, half-and-half, and cream cheese. Whole-fat or sweetened yogurt. Full-fat cheeses. Nondairy creamers and whipped toppings. Processed cheese, cheese spreads, or cheese curds. Sweets and Desserts Corn syrup, sugars, honey, and molasses. Candy. Jam and jelly. Syrup. Sweetened cereals. Cookies, pies, cakes, donuts, muffins, and ice cream. Fats and Oils Butter, stick margarine, lard, shortening, ghee, or bacon fat. Coconut, palm kernel, or palm oils. Beverages Alcohol. Sweetened drinks (such as sodas, lemonade, and fruit drinks or punches). The items listed above may not be a complete list of foods and beverages to avoid. Contact your dietitian for more information. Document Released: 12/25/2003 Document Revised: 03/13/2013 Document Reviewed: 01/10/2013 [employer] Patient Information 2015 [employer]. This information is not intended to replace advice given to you by your health care provider. Make sure you discuss   any questions you have with your health care provider. Dyslipidemia Dyslipidemia is an imbalance of the lipids in your  blood. Lipids are waxy, fat-like proteins that your body needs in small amounts. Dyslipidemia often involves the lipids cholesterol or triglycerides. Common forms of dyslipidemia are:  High levels of bad cholesterol (LDL cholesterol). LDL cholesterol is the type of cholesterol that causes heart disease.  Low levels of good cholesterol (HDL cholesterol). HDL cholesterol is the type of cholesterol that helps protect against heart disease.  High levels of triglycerides. Triglycerides are a fatty substance in the blood linked to a buildup of plaque on your arteries. RISK FACTORS  Increased age.  Having a family history of high cholesterol.  Certain medicines, including birth control pills, diuretics, beta-blockers, and some medicines for depression.  Smoking.  Eating a high-fat diet.  Being overweight.  Medical conditions such as diabetes, polycystic ovary syndrome, pregnancy, kidney disease, and hypothyroidism.  Lack of regular exercise. SIGNS AND SYMPTOMS There are no signs or symptoms with dyslipidemia.  DIAGNOSIS  A simple blood test called a fasting blood test can be done to determine your level of:  Total cholesterol. This is the combined number of LDL cholesterol and HDL cholesterol. A healthy number is lower than 200.  LDL cholesterol. The goal number for LDL cholesterol is different for each person depending on risk factors. Ask your health care provider what your LDL cholesterol number should be.  HDL cholesterol. A healthy level of HDL cholesterol is 60 or higher. A number lower than 40 for men or 50 for women is a danger sign.  Triglycerides. A healthy triglyceride number is less than 150. TREATMENT  Dyslipidemia is a treatable condition. Your health care provider will advise you on what type of treatment is best based on your age, your test results, and current guidelines. Treatment may include:   Dietary changes. A dietitian can help you create a meal plan. You may  need to:  Eat more foods that contain omega-3s, such as salmon and other fish.  Replace saturated fats and trans fats in your diet with healthy fats such as nuts, seeds, avocados, olive oil, and canola oil.  Regular exercise. This can help lower your LDL cholesterol, raise your HDL cholesterol, and help with weight management. Check with your health care provider before beginning an exercise program. Most people should participate in 30 minutes of brisk exercise 5 days a week.  Quitting smoking.  Medicines to lower LDL cholesterol and triglycerides. Your health care provider will monitor your lipid levels with regular blood tests. HOME CARE INSTRUCTIONS  Eat a healthy diet. Follow any diet instructions if they were given to you by your health care provider.  Maintain a healthy weight.  Exercise regularly based on the recommendations of your health care provider.  Do not use any tobacco products, including cigarettes, chewing tobacco, or electronic cigarettes.  Take medicines only as directed by your health care provider.  Keep all follow-up visits as directed by your health care provider. SEEK MEDICAL CARE IF: You are having possible side effects from your medicines. Document Released: 03/13/2013 Document Revised: 07/23/2013 Document Reviewed: 03/13/2013 [facility] Patient Information 2015 [place], [place]. This information is not intended to replace advice given to you by your health care provider. Make sure you discuss any questions you have with your health care provider.

## 2014-08-26 NOTE — Progress Notes (Signed)
Pt here to follow up on Depakote levels, was told Friday his level was high. Did not take over the weekend but did take it this morning.

## 2014-08-26 NOTE — Progress Notes (Signed)
Patient ID: Cody Delacruz, male   DOB: 26-Oct-1965, 49 y.o.   MRN: [id number]   Cody Delacruz, is a 49 y.o. male  [id number]  [id number]  DOB - 1965/06/16  Chief Complaint  Patient presents with  . Seizure disorder  . Discuss labs        Subjective:   Cody Delacruz is a 49 y.o. male here today for a follow up visit. Patient has history of chronic back pain, seizure disorder and major depression, recently had a blood draw that showed very high LDL cholesterol, patient was scheduled appointment today to discuss management plan. He has no new complaint. He has an ongoing pain and is awaiting pain clinic appointment. He uses cane to ambulate. Patient has No headache, No chest pain, No abdominal pain - No Nausea, No new weakness tingling or numbness, No Cough - SOB.  No problems updated.  ALLERGIES: Allergies  Allergen Reactions  . Codeine Other (See Comments)    Unknown childhood allergy    PAST MEDICAL HISTORY: Past Medical History  Diagnosis Date  . Neck pain   . Back pain   . Other acariasis     Hep C  . Seizures   . Depression     MEDICATIONS AT HOME: Prior to Admission medications   Medication Sig Start Date End Date Taking? Authorizing Provider  divalproex (DEPAKOTE ER) 500 MG 24 hr tablet Take 1 tablet (500 mg total) by mouth 3 (three) times daily. 08/13/14  Yes Tresa Garter, MD  omeprazole (PRILOSEC) 40 MG capsule Take 1 capsule (40 mg total) by mouth daily. 08/02/13  Yes Tresa Garter, MD  sertraline (ZOLOFT) 100 MG tablet Take 100 mg by mouth daily.   Yes Historical Provider, MD  acetaminophen-codeine (TYLENOL #3) 300-30 MG per tablet Take 1 tablet by mouth every 4 (four) hours as needed. Patient not taking: Reported on 08/26/2014 12/13/13   Tresa Garter, MD  ibuprofen (ADVIL,MOTRIN) 800 MG tablet Take 1 tablet (800 mg total) by mouth every 6 (six) hours as needed for pain. Patient not taking: Reported on 08/26/2014 12/01/12   Tresa Garter, MD  methocarbamol (ROBAXIN) 500 MG tablet Take 1 tablet (500 mg total) by mouth 2 (two) times daily. Patient not taking: Reported on 08/26/2014 03/02/13   Nishant Dhungel, MD  Multiple Vitamin (MULTIVITAMIN WITH MINERALS) TABS Take 1 tablet by mouth daily.    Historical Provider, MD  pravastatin (PRAVACHOL) 40 MG tablet Take 1 tablet (40 mg total) by mouth daily. 08/26/14   Tresa Garter, MD  propranolol (INDERAL) 20 MG tablet Take 20 mg by mouth 2 (two) times daily.    Historical Provider, MD  Thiamine HCl (VITAMIN B-1) 250 MG tablet Take 250 mg by mouth daily.    Historical Provider, MD  traZODone (DESYREL) 100 MG tablet Take 1 tablet (100 mg total) by mouth at bedtime. Patient not taking: Reported on 08/26/2014 03/02/13   Flonnie Overman Dhungel, MD     Objective:   Filed Vitals:   08/26/14 1048  BP: 125/86  Pulse: 73  Temp: 98.1 F (36.7 C)  TempSrc: Oral  Height: 5\' 9"  (1.753 m)  Weight: 206 lb (93.441 kg)    Exam General appearance : Awake, alert, not in any distress. Speech Clear. Not toxic looking HEENT: Atraumatic and Normocephalic, pupils equally reactive to light and accomodation Neck: supple, no JVD. No cervical lymphadenopathy.  Chest:Good air entry bilaterally, no added sounds  CVS: S1 S2 regular, no murmurs.  Abdomen: Bowel  sounds present, Non tender and not distended with no gaurding, rigidity or rebound. Extremities: B/L Lower Ext shows no edema, both legs are warm to touch Neurology: Awake alert, and oriented X 3, CN II-XII intact, Non focal Skin:No Rash  Data Review No results found for: HGBA1C   Assessment & Plan   1. Dyslipidemia  - pravastatin (PRAVACHOL) 40 MG tablet; Take 1 tablet (40 mg total) by mouth daily.  Dispense: 90 tablet; Refill: 3 To address this please limit saturated fat to no more than 7% of your calories, limit cholesterol to 200 mg/day, increase fiber and exercise as tolerated. If needed we may add another cholesterol lowering  medication to your regimen.   2. Bilateral low back pain with left-sided sciatica  - Ambulatory referral to Pain Clinic  The patient was counseled on the dangers of tobacco use, and was advised to quit. Reviewed strategies to maximize success, including removing cigarettes and smoking materials from environment, stress management and support of family/friends.  Patient have been counseled extensively about nutrition and exercise Return in about 3 months (around 11/26/2014), or if symptoms worsen or fail to improve, for Follow up Pain and comorbidities, Follow up HTN.  The patient was given clear instructions to go to ER or return to medical center if symptoms don't improve, worsen or new problems develop. The patient verbalized understanding. The patient was told to call to get lab results if they haven't heard anything in the next week.   This note has been created with Surveyor, quantity. Any transcriptional errors are unintentional.    Angelica Chessman, MD, [place], [place], [place], [place] [facility] [place], [place]   08/26/2014, 11:35 AM

## 2015-02-03 ENCOUNTER — Ambulatory Visit: Payer: Medicaid Other | Admitting: Internal Medicine

## 2015-02-06 ENCOUNTER — Ambulatory Visit: Payer: Medicaid Other | Admitting: Internal Medicine

## 2015-03-10 ENCOUNTER — Ambulatory Visit: Payer: Medicaid Other | Attending: Internal Medicine | Admitting: Internal Medicine

## 2015-03-10 ENCOUNTER — Encounter: Payer: Self-pay | Admitting: Internal Medicine

## 2015-03-10 ENCOUNTER — Other Ambulatory Visit: Payer: Self-pay | Admitting: Internal Medicine

## 2015-03-10 VITALS — BP 138/87 | HR 108 | Temp 98.2°F | Resp 18 | Ht 69.0 in | Wt 199.0 lb

## 2015-03-10 DIAGNOSIS — Z0000 Encounter for general adult medical examination without abnormal findings: Secondary | ICD-10-CM | POA: Insufficient documentation

## 2015-03-10 DIAGNOSIS — E785 Hyperlipidemia, unspecified: Secondary | ICD-10-CM

## 2015-03-10 DIAGNOSIS — G8929 Other chronic pain: Secondary | ICD-10-CM | POA: Diagnosis not present

## 2015-03-10 DIAGNOSIS — M545 Low back pain: Secondary | ICD-10-CM | POA: Insufficient documentation

## 2015-03-10 DIAGNOSIS — M5442 Lumbago with sciatica, left side: Secondary | ICD-10-CM | POA: Diagnosis not present

## 2015-03-10 DIAGNOSIS — Z79899 Other long term (current) drug therapy: Secondary | ICD-10-CM | POA: Insufficient documentation

## 2015-03-10 DIAGNOSIS — R569 Unspecified convulsions: Secondary | ICD-10-CM | POA: Diagnosis not present

## 2015-03-10 DIAGNOSIS — Z885 Allergy status to narcotic agent status: Secondary | ICD-10-CM | POA: Insufficient documentation

## 2015-03-10 DIAGNOSIS — B1920 Unspecified viral hepatitis C without hepatic coma: Secondary | ICD-10-CM | POA: Insufficient documentation

## 2015-03-10 DIAGNOSIS — M542 Cervicalgia: Secondary | ICD-10-CM | POA: Diagnosis not present

## 2015-03-10 DIAGNOSIS — G40909 Epilepsy, unspecified, not intractable, without status epilepticus: Secondary | ICD-10-CM | POA: Insufficient documentation

## 2015-03-10 LAB — LIPID PANEL
CHOLESTEROL: 239 mg/dL — AB (ref 125–200)
HDL: 58 mg/dL (ref >=40)
LDL Cholesterol: 147 mg/dL — ABNORMAL HIGH (ref <130)
TRIGLYCERIDES: 169 mg/dL — AB (ref <150)
Total CHOL/HDL Ratio: 4.1 Ratio (ref <=5.0)
VLDL: 34 mg/dL — AB (ref <30)

## 2015-03-10 LAB — TSH: TSH: 1.448 u[IU]/mL (ref 0.350–4.500)

## 2015-03-10 MED ORDER — PRAVASTATIN SODIUM 40 MG PO TABS: 40.0000 mg | ORAL_TABLET | Freq: Every day | ORAL | Status: DC

## 2015-03-10 MED ORDER — ACETAMINOPHEN-CODEINE #3 300-30 MG PO TABS: 1.0000 | ORAL_TABLET | ORAL | Status: DC | PRN

## 2015-03-10 MED ORDER — DIVALPROEX SODIUM ER 500 MG PO TB24: 500.0000 mg | ORAL_TABLET | Freq: Three times a day (TID) | ORAL | Status: DC

## 2015-03-10 NOTE — Patient Instructions (Signed)
Seizure, Adult A seizure is abnormal electrical activity in the brain. Seizures usually last from 30 seconds to 2 minutes. There are various types of seizures. Before a seizure, you may have a warning sensation (aura) that a seizure is about to occur. An aura may include the following symptoms:   Fear or anxiety.  Nausea.  Feeling like the room is spinning (vertigo).  Vision changes, such as seeing flashing lights or spots. Common symptoms during a seizure include:  A change in attention or behavior (altered mental status).  Convulsions with rhythmic jerking movements.  Drooling.  Rapid eye movements.  Grunting.  Loss of bladder and bowel control.  Bitter taste in the mouth.  Tongue biting. After a seizure, you may feel confused and sleepy. You may also have an injury resulting from convulsions during the seizure. HOME CARE INSTRUCTIONS   If you are given medicines, take them exactly as prescribed by your health care provider.  Keep all follow-up appointments as directed by your health care provider.  Do not swim or drive or engage in risky activity during which a seizure could cause further injury to you or others until your health care provider says it is OK.  Get adequate rest.  Teach friends and family what to do if you have a seizure. They should:  Lay you on the ground to prevent a fall.  Put a cushion under your head.  Loosen any tight clothing around your neck.  Turn you on your side. If vomiting occurs, this helps keep your airway clear.  Stay with you until you recover.  Know whether or not you need emergency care. SEEK IMMEDIATE MEDICAL CARE IF:  The seizure lasts longer than 5 minutes.  The seizure is severe or you do not wake up immediately after the seizure.  You have an altered mental status after the seizure.  You are having more frequent or worsening seizures. Someone should drive you to the emergency department or call local emergency  services (911 in [place] MAKE SURE YOU:  Understand these instructions.  Will watch your condition.  Will get help right away if you are not doing well or get worse.   This information is not intended to replace advice given to you by your health care provider. Make sure you discuss any questions you have with your health care provider.   Document Released: 03/05/2000 Document Revised: 03/29/2014 Document Reviewed: 10/18/2012 Elsevier Interactive Patient Education 2016 Elsevier Inc. Chronic Pain Chronic pain can be defined as pain that is off and on and lasts for 3-6 months or longer. Many things cause chronic pain, which can make it difficult to make a diagnosis. There are many treatment options available for chronic pain. However, finding a treatment that works well for you may require trying various approaches until the right one is found. Many people benefit from a combination of two or more types of treatment to control their pain. SYMPTOMS  Chronic pain can occur anywhere in the body and can range from mild to very severe. Some types of chronic pain include:  Headache.  Low back pain.  Cancer pain.  Arthritis pain.  Neurogenic pain. This is pain resulting from damage to nerves. People with chronic pain may also have other symptoms such as:  Depression.  Anger.  Insomnia.  Anxiety. DIAGNOSIS  Your health care provider will help diagnose your condition over time. In many cases, the initial focus will be on excluding possible conditions that could be causing the pain. Depending  on your symptoms, your health care provider may order tests to diagnose your condition. Some of these tests may include:   Blood tests.   CT scan.   MRI.   X-rays.   Ultrasounds.   Nerve conduction studies.  You may need to see a specialist.  TREATMENT  Finding treatment that works well may take time. You may be referred to a pain specialist. He or she may prescribe medicine or  therapies, such as:   Mindful meditation or yoga.  Shots (injections) of numbing or pain-relieving medicines into the spine or area of pain.  Local electrical stimulation.  Acupuncture.   Massage therapy.   Aroma, color, light, or sound therapy.   Biofeedback.   Working with a physical therapist to keep from getting stiff.   Regular, gentle exercise.   Cognitive or behavioral therapy.   Group support.  Sometimes, surgery may be recommended.  HOME CARE INSTRUCTIONS   Take all medicines as directed by your health care provider.   Lessen stress in your life by relaxing and doing things such as listening to calming music.   Exercise or be active as directed by your health care provider.   Eat a healthy diet and include things such as vegetables, fruits, fish, and lean meats in your diet.   Keep all follow-up appointments with your health care provider.   Attend a support group with others suffering from chronic pain. SEEK MEDICAL CARE IF:   Your pain gets worse.   You develop a new pain that was not there before.   You cannot tolerate medicines given to you by your health care provider.   You have new symptoms since your last visit with your health care provider.  SEEK IMMEDIATE MEDICAL CARE IF:   You feel weak.   You have decreased sensation or numbness.   You lose control of bowel or bladder function.   Your pain suddenly gets much worse.   You develop shaking.  You develop chills.  You develop confusion.  You develop chest pain.  You develop shortness of breath.  MAKE SURE YOU:  Understand these instructions.  Will watch your condition.  Will get help right away if you are not doing well or get worse.   This information is not intended to replace advice given to you by your health care provider. Make sure you discuss any questions you have with your health care provider.   Document Released: 11/28/2001 Document Revised:  11/08/2012 Document Reviewed: 09/01/2012 Elsevier Interactive Patient Education [employer].

## 2015-03-10 NOTE — Progress Notes (Signed)
Patient ID: Cody Delacruz, male   DOB: 07-07-1965, 49 y.o.   MRN: [id number]   Cody Delacruz, is a 49 y.o. male  [id number]  [id number]  DOB - 03-08-1966  Chief Complaint  Patient presents with  . Follow-up        Subjective:   Cody Delacruz is a 49 y.o. male with history of chronic low back pain, chronic neck pain, dyslipidemia, seizure disorder and major depression here today for a follow up visit. Patient has no new complaint today. He is requesting refill of his medications. He has not been able to see neurosurgery for his chronic low back pain, and was not able to schedule appointment with pain clinic because of lack of insurance. But now he has insurance and would like to be referred. He has no new complaint. Last seizure was over a month ago. Patient is not sure the character of his seizures but that his son told him that sometimes his joints locks up on him and he shakes all over. No head injury. Patient has No headache, No chest pain, No abdominal pain - No Nausea, No new weakness tingling or numbness, No Cough - SOB.  Problem  Healthcare Maintenance    ALLERGIES: Allergies  Allergen Reactions  . Codeine Other (See Comments)    Unknown childhood allergy    PAST MEDICAL HISTORY: Past Medical History  Diagnosis Date  . Neck pain   . Back pain   . Other acariasis     Hep C  . Seizures ([place])   . Depression     MEDICATIONS AT HOME: Prior to Admission medications   Medication Sig Start Date End Date Taking? Authorizing Provider  divalproex (DEPAKOTE ER) 500 MG 24 hr tablet Take 1 tablet (500 mg total) by mouth 3 (three) times daily. 03/10/15  Yes Tresa Garter, MD  methocarbamol (ROBAXIN) 500 MG tablet Take 1 tablet (500 mg total) by mouth 2 (two) times daily. 03/02/13  Yes Nishant Dhungel, MD  omeprazole (PRILOSEC) 40 MG capsule Take 1 capsule (40 mg total) by mouth daily. 08/02/13  Yes Tresa Garter, MD  pravastatin (PRAVACHOL) 40 MG tablet Take  1 tablet (40 mg total) by mouth daily. 03/10/15  Yes Tresa Garter, MD  sertraline (ZOLOFT) 100 MG tablet Take 100 mg by mouth daily.   Yes Historical Provider, MD  acetaminophen-codeine (TYLENOL #3) 300-30 MG tablet Take 1 tablet by mouth every 4 (four) hours as needed. 03/10/15   Tresa Garter, MD  ibuprofen (ADVIL,MOTRIN) 800 MG tablet Take 1 tablet (800 mg total) by mouth every 6 (six) hours as needed for pain. Patient not taking: Reported on 08/26/2014 12/01/12   Tresa Garter, MD  Multiple Vitamin (MULTIVITAMIN WITH MINERALS) TABS Take 1 tablet by mouth daily. Reported on 03/10/2015    Historical Provider, MD  propranolol (INDERAL) 20 MG tablet Take 20 mg by mouth 2 (two) times daily. Reported on 03/10/2015    Historical Provider, MD  Thiamine HCl (VITAMIN B-1) 250 MG tablet Take 250 mg by mouth daily. Reported on 03/10/2015    Historical Provider, MD  traZODone (DESYREL) 100 MG tablet Take 1 tablet (100 mg total) by mouth at bedtime. Patient not taking: Reported on 08/26/2014 03/02/13   Nishant Dhungel, MD     Objective:   Filed Vitals:   03/10/15 0940  BP: 138/87  Pulse: 108  Temp: 98.2 F (36.8 C)  TempSrc: Oral  Resp: 18  Height: 5\' 9"  (1.753 m)  Weight: 199  lb (90.266 kg)  SpO2: 99%    Exam General appearance : Awake, alert, not in any distress. Speech Clear. Not toxic looking HEENT: Atraumatic and Normocephalic, pupils equally reactive to light and accomodation Neck: supple, no JVD. No cervical lymphadenopathy.  Chest:Good air entry bilaterally, no added sounds  CVS: S1 S2 regular, no murmurs.  Abdomen: Bowel sounds present, Non tender and not distended with no gaurding, rigidity or rebound. Extremities: B/L Lower Ext shows no edema, both legs are warm to touch Neurology: Awake alert, and oriented X 3, CN II-XII intact, Non focal Skin:No Rash  Data Review No results found for: HGBA1C   Assessment & Plan   1. Healthcare maintenance  - Flu Vaccine  QUAD 36+ mos PF IM (Fluarix & Fluzone Quad PF)  2. Seizure disorder (HCC)  - divalproex (DEPAKOTE ER) 500 MG 24 hr tablet; Take 1 tablet (500 mg total) by mouth 3 (three) times daily.  Dispense: 270 tablet; Refill: 3 - Valproic acid level - TSH  3. Dyslipidemia  - pravastatin (PRAVACHOL) 40 MG tablet; Take 1 tablet (40 mg total) by mouth daily.  Dispense: 90 tablet; Refill: 3 - Lipid panel  To address this please limit saturated fat to no more than 7% of your calories, limit cholesterol to 200 mg/day, increase fiber and exercise as tolerated. If needed we may add another cholesterol lowering medication to your regimen.   4. Bilateral low back pain with left-sided sciatica  - Ambulatory referral to Pain Clinic - Ambulatory referral to Neurosurgery - acetaminophen-codeine (TYLENOL #3) 300-30 MG tablet; Take 1 tablet by mouth every 4 (four) hours as needed.  Dispense: 60 tablet; Refill: 0  Patient have been counseled extensively about nutrition and exercise  Return in about 6 months (around 09/08/2015), or if symptoms worsen or fail to improve, for Seizure Disorder, Chronic Pain.  The patient was given clear instructions to go to ER or return to medical center if symptoms don't improve, worsen or new problems develop. The patient verbalized understanding. The patient was told to call to get lab results if they haven't heard anything in the next week.   This note has been created with Surveyor, quantity. Any transcriptional errors are unintentional.    Angelica Chessman, MD, [place], [place], [place] and [facility] [place], [place]   03/10/2015, 10:21 AM

## 2015-03-10 NOTE — Progress Notes (Signed)
Patient here for 3 month FU for Seizures  Patient has had two seizures since his last visit.  Patient complains of chronic pain in his back and left leg scaled at a 5.   Patient needs refills on Depakote. Patient took last dose this morning.  Patient would also like a flu shot.

## 2015-03-11 LAB — VALPROIC ACID LEVEL: Valproic Acid Lvl: 18.9 ug/mL — ABNORMAL LOW (ref 50.0–100.0)

## 2015-03-14 NOTE — Telephone Encounter (Signed)
Medical Assistant unable to leave VM due to contact being unavailable with automated message.

## 2015-03-14 NOTE — Telephone Encounter (Signed)
-----   Message from Tresa Garter, MD sent at 03/12/2015  7:03 PM EST ----- Please inform patient that his cholesterol is still high but much improved than previous result. Encouraged patient to continue cholesterol medication and also please limit saturated fat to no more than 7% of your calories, limit cholesterol to 200 mg/day, increase fiber and exercise as tolerated. If needed we may add another cholesterol lowering medication to your regimen. Encouraged patient to take his seizure medication as prescribed because his level is low.

## 2015-03-23 DIAGNOSIS — D126 Benign neoplasm of colon, unspecified: Secondary | ICD-10-CM

## 2015-03-23 HISTORY — PX: COLONOSCOPY: SHX174

## 2015-03-23 HISTORY — DX: Benign neoplasm of colon, unspecified: D12.6

## 2015-06-02 ENCOUNTER — Other Ambulatory Visit: Payer: Self-pay | Admitting: Internal Medicine

## 2015-06-02 ENCOUNTER — Telehealth: Payer: Self-pay | Admitting: Internal Medicine

## 2015-06-02 NOTE — Telephone Encounter (Signed)
Pt. Called requesting a med refill on omeprazole (PRILOSEC) 40 MG capsule. Please f/u with pt.

## 2015-06-06 ENCOUNTER — Other Ambulatory Visit: Payer: Self-pay | Admitting: *Deleted

## 2015-06-06 NOTE — Telephone Encounter (Signed)
Patients medication was refilled with 3 additional refills on 06/02/15

## 2015-06-06 NOTE — Telephone Encounter (Signed)
Patients Prilosec was refilled with 3 additional refills.

## 2015-06-12 ENCOUNTER — Telehealth: Payer: Self-pay | Admitting: Internal Medicine

## 2015-06-12 NOTE — Telephone Encounter (Signed)
Patient would like to speak to pcp regarding his surgery and recent diagnosis.Marland KitchenMarland KitchenMarland KitchenMarland Kitchenplease follow up with patient

## 2015-06-17 NOTE — Telephone Encounter (Signed)
Patient verified DOB. Patient received an Epidural injection in his spine. Patient will bring paperwork with him to April 6th visit. Patient has no further questions at this time.

## 2015-06-23 ENCOUNTER — Ambulatory Visit: Payer: Medicaid Other | Admitting: Internal Medicine

## 2015-06-26 ENCOUNTER — Ambulatory Visit: Payer: Medicaid Other | Admitting: Internal Medicine

## 2016-01-07 ENCOUNTER — Telehealth: Payer: Self-pay

## 2016-01-07 NOTE — Telephone Encounter (Signed)
I have the paperwork in hand and will have it ready for patients visit tomorrow.

## 2016-01-07 NOTE — Telephone Encounter (Signed)
Anderson Malta called from [employer] and stated she faxed over paper for pt because pt is trying to get a power chair and was wondering has it be signed. Pt is going into the office tomorrow and she just wanted to make sure provider had paper to fill out for when the patient comes in for his appointment tomorrow. Had jeniffer refax paperwork just in case

## 2016-01-08 ENCOUNTER — Ambulatory Visit: Payer: Medicaid Other | Admitting: Internal Medicine

## 2016-01-22 ENCOUNTER — Encounter: Payer: Self-pay | Admitting: Internal Medicine

## 2016-01-22 ENCOUNTER — Telehealth: Payer: Self-pay | Admitting: Internal Medicine

## 2016-01-22 ENCOUNTER — Ambulatory Visit: Payer: Medicaid Other | Attending: Internal Medicine | Admitting: Internal Medicine

## 2016-01-22 VITALS — BP 119/77 | HR 90 | Temp 98.1°F | Resp 18 | Ht 70.0 in | Wt 186.0 lb

## 2016-01-22 DIAGNOSIS — E785 Hyperlipidemia, unspecified: Secondary | ICD-10-CM | POA: Diagnosis not present

## 2016-01-22 DIAGNOSIS — F329 Major depressive disorder, single episode, unspecified: Secondary | ICD-10-CM | POA: Insufficient documentation

## 2016-01-22 DIAGNOSIS — Z741 Need for assistance with personal care: Secondary | ICD-10-CM | POA: Insufficient documentation

## 2016-01-22 DIAGNOSIS — Z79899 Other long term (current) drug therapy: Secondary | ICD-10-CM | POA: Diagnosis not present

## 2016-01-22 DIAGNOSIS — Z888 Allergy status to other drugs, medicaments and biological substances status: Secondary | ICD-10-CM | POA: Diagnosis not present

## 2016-01-22 DIAGNOSIS — R200 Anesthesia of skin: Secondary | ICD-10-CM | POA: Diagnosis not present

## 2016-01-22 DIAGNOSIS — M545 Low back pain, unspecified: Secondary | ICD-10-CM

## 2016-01-22 DIAGNOSIS — M542 Cervicalgia: Secondary | ICD-10-CM

## 2016-01-22 DIAGNOSIS — G8929 Other chronic pain: Secondary | ICD-10-CM

## 2016-01-22 DIAGNOSIS — G40909 Epilepsy, unspecified, not intractable, without status epilepticus: Secondary | ICD-10-CM | POA: Insufficient documentation

## 2016-01-22 DIAGNOSIS — R531 Weakness: Secondary | ICD-10-CM | POA: Diagnosis not present

## 2016-01-22 DIAGNOSIS — R202 Paresthesia of skin: Secondary | ICD-10-CM

## 2016-01-22 DIAGNOSIS — G894 Chronic pain syndrome: Secondary | ICD-10-CM | POA: Insufficient documentation

## 2016-01-22 DIAGNOSIS — M5442 Lumbago with sciatica, left side: Secondary | ICD-10-CM | POA: Diagnosis not present

## 2016-01-22 MED ORDER — SERTRALINE HCL 100 MG PO TABS: 100.0000 mg | ORAL_TABLET | Freq: Every day | ORAL | 3 refills | Status: DC

## 2016-01-22 MED ORDER — OMEPRAZOLE 20 MG PO CPDR
40.0000 mg | DELAYED_RELEASE_CAPSULE | Freq: Every day | ORAL | 3 refills | Status: DC
Start: 2016-01-22 — End: 2016-09-15

## 2016-01-22 MED ORDER — METHOCARBAMOL 500 MG PO TABS: 500.0000 mg | ORAL_TABLET | Freq: Two times a day (BID) | ORAL | 1 refills | Status: DC

## 2016-01-22 MED ORDER — DIVALPROEX SODIUM ER 500 MG PO TB24: 500.0000 mg | ORAL_TABLET | Freq: Three times a day (TID) | ORAL | 3 refills | Status: DC

## 2016-01-22 MED ORDER — PRAVASTATIN SODIUM 40 MG PO TABS: 40.0000 mg | ORAL_TABLET | Freq: Every day | ORAL | 3 refills | Status: DC

## 2016-01-22 MED ORDER — ACETAMINOPHEN-CODEINE #3 300-30 MG PO TABS: 1.0000 | ORAL_TABLET | ORAL | 0 refills | Status: DC | PRN

## 2016-01-22 NOTE — Progress Notes (Signed)
Cody Delacruz, is a 50 y.o. male  [id number]  [id number]  DOB - 1966/02/03  Chief Complaint  Patient presents with  . Other    Mobility Exam Face to Face      Subjective:   Cody Delacruz is a 50 y.o. male  with history of chronic low back pain, chronic neck pain, dyslipidemia, seizure disorder and major depression here today for a follow up visit, medication refill and face-to-face mobility exam. He was referred to neurosurgery, surgery had been suggested Vs epidural injection, patient accepted a trial of epidural injection but his pain has been worse ever since. His pain is so severe now that it affects his ADL and quality of life. He walks with a cane currently but with his numbness and tingling in both hands, he is constantly fearful that he may fall one day. Patient was tearful today because of severe pain. He is not able to ambulate far because of so much pain, so even with the cane, he is unable to do much such as going for grocery shopping or home management, cooking and so on. He is also not able to get up from a seated position without significant pain. He will go back to neurosurgery to have reevaluation and possible surgery done. He needs another referral. Patient has difficulty holding objects with his hands because of contractures and numbness, as a result, he is not able to propel a manual wheelchair. Patient's constant pain is rated at between 7 and 10 daily. With powered chair, patient will have better quality of life with ability to freely move around to do grocery shopping and move around at home, activities that involve movements associated with pain will be reduced significantly, this will also impacts patient's depression positively. Patient has No headache, No chest pain, No abdominal pain - No Nausea.  Problem  Numbness and Tingling in Both Hands  Chronic Pain Syndrome  Assistance Needed for Mobility  Chronic Bilateral Low Back Pain With Left-Sided Sciatica     ALLERGIES: Allergies  Allergen Reactions  . Codeine Other (See Comments)    Unknown childhood allergy    PAST MEDICAL HISTORY: Past Medical History:  Diagnosis Date  . Back pain   . Depression   . Neck pain   . Other acariasis    Hep C  . Seizures ([place])     MEDICATIONS AT HOME: Prior to Admission medications   Medication Sig Start Date End Date Taking? Authorizing Provider  acetaminophen-codeine (TYLENOL #3) 300-30 MG tablet Take 1 tablet by mouth every 4 (four) hours as needed. 01/22/16  Yes Tresa Garter, MD  divalproex (DEPAKOTE ER) 500 MG 24 hr tablet Take 1 tablet (500 mg total) by mouth 3 (three) times daily. 01/22/16  Yes Tresa Garter, MD  methocarbamol (ROBAXIN) 500 MG tablet Take 1 tablet (500 mg total) by mouth 2 (two) times daily. 01/22/16  Yes Tresa Garter, MD  Multiple Vitamin (MULTIVITAMIN WITH MINERALS) TABS Take 1 tablet by mouth daily. Reported on 03/10/2015   Yes Historical Provider, MD  omeprazole (PRILOSEC) 20 MG capsule Take 2 capsules (40 mg total) by mouth daily. 01/22/16  Yes Tresa Garter, MD  pravastatin (PRAVACHOL) 40 MG tablet Take 1 tablet (40 mg total) by mouth daily. 01/22/16  Yes Tresa Garter, MD  propranolol (INDERAL) 20 MG tablet Take 20 mg by mouth 2 (two) times daily. Reported on 03/10/2015   Yes Historical Provider, MD  sertraline (ZOLOFT) 100 MG tablet Take 1 tablet (  100 mg total) by mouth daily. 01/22/16  Yes Tresa Garter, MD  Thiamine HCl (VITAMIN B-1) 250 MG tablet Take 250 mg by mouth daily. Reported on 03/10/2015   Yes Historical Provider, MD  traZODone (DESYREL) 100 MG tablet Take 1 tablet (100 mg total) by mouth at bedtime. 03/02/13  Yes Nishant Dhungel, MD    Objective:   Vitals:   01/22/16 1157  BP: 119/77  Pulse: 90  Resp: 18  Temp: 98.1 F (36.7 C)  TempSrc: Oral  SpO2: 99%  Weight: 186 lb (84.4 kg)  Height: 5\' 10"  (1.778 m)   Exam General appearance : Awake, alert, not in any  distress. Speech Clear. Not toxic looking HEENT: Atraumatic and Normocephalic, pupils equally reactive to light and accomodation Neck: Supple, no JVD. No cervical lymphadenopathy.  Chest: Good air entry bilaterally, no added sounds  CVS: S1 S2 regular, no murmurs.  Abdomen: Bowel sounds present, Non tender and not distended with no gaurding, rigidity or rebound. Extremities: B/L Lower Ext shows no edema, both legs are warm to touch. Muscle power: 3-4/5 globally, Contracture of medial one and half fingers (difficult to hold objects) Neurology: Awake alert, and oriented X 3, CN II-XII intact, Non focal. Patient was  Tearful because of pain during examination maneuvers, examination terminated to stall further discomfort  Data Review No results found for: HGBA1C  Assessment & Plan   1. Numbness and tingling in both hands Mobility assistance needed because patient unable to propel manual wheelchair due to weakness and numbness Awaiting Neuro Surgery  2. Chronic bilateral low back pain with left-sided sciatica  - acetaminophen-codeine (TYLENOL #3) 300-30 MG tablet; Take 1 tablet by mouth every 4 (four) hours as needed.  Dispense: 60 tablet; Refill: 0  3. Chronic pain syndrome  Continue Tylenol # 3  4. Assistance needed for mobility  Mobility Examination done today  With powered chair, patient will have better quality of life with ability to freely move around to do grocery shopping and move around at home, activities that involve movements associated with pain will be reduced significantly, this will also impacts patient's depression positively.  Patient is oriented x 3 and able to safely operate a power wheelchair at home.  5. Neck pain, chronic  - methocarbamol (ROBAXIN) 500 MG tablet; Take 1 tablet (500 mg total) by mouth 2 (two) times daily.  Dispense: 30 tablet; Refill: 1  6. Chronic bilateral low back pain without sciatica  - methocarbamol (ROBAXIN) 500 MG tablet; Take 1  tablet (500 mg total) by mouth 2 (two) times daily.  Dispense: 30 tablet; Refill: 1  7. Dyslipidemia  - pravastatin (PRAVACHOL) 40 MG tablet; Take 1 tablet (40 mg total) by mouth daily.  Dispense: 90 tablet; Refill: 3 To address this please limit saturated fat to no more than 7% of your calories, limit cholesterol to 200 mg/day, increase fiber and exercise as tolerated. If needed we may add another cholesterol lowering medication to your regimen.   8. Seizure disorder ([place])  - divalproex (DEPAKOTE ER) 500 MG 24 hr tablet; Take 1 tablet (500 mg total) by mouth 3 (three) times daily.  Dispense: 270 tablet; Refill: 3   Return in about 6 months (around 07/21/2016) for Follow up Pain and comorbidities.  The patient was given clear instructions to go to ER or return to medical center if symptoms don't improve, worsen or new problems develop. The patient verbalized understanding. The patient was told to call to get lab results if they haven't  heard anything in the next week.   This note has been created with Surveyor, quantity. Any transcriptional errors are unintentional.    Angelica Chessman, MD, [place], [place], [place], [place] and [place], [place]   01/22/2016, 12:23 PM

## 2016-01-22 NOTE — Patient Instructions (Signed)
Chronic Pain Chronic pain can be defined as pain that is off and on and lasts for 3-6 months or longer. Many things cause chronic pain, which can make it difficult to make a diagnosis. There are many treatment options available for chronic pain. However, finding a treatment that works well for you may require trying various approaches until the right one is found. Many people benefit from a combination of two or more types of treatment to control their pain. SYMPTOMS  Chronic pain can occur anywhere in the body and can range from mild to very severe. Some types of chronic pain include:  Headache.  Low back pain.  Cancer pain.  Arthritis pain.  Neurogenic pain. This is pain resulting from damage to nerves. People with chronic pain may also have other symptoms such as:  Depression.  Anger.  Insomnia.  Anxiety. DIAGNOSIS  Your health care provider will help diagnose your condition over time. In many cases, the initial focus will be on excluding possible conditions that could be causing the pain. Depending on your symptoms, your health care provider may order tests to diagnose your condition. Some of these tests may include:   Blood tests.   CT scan.   MRI.   X-rays.   Ultrasounds.   Nerve conduction studies.  You may need to see a specialist.  TREATMENT  Finding treatment that works well may take time. You may be referred to a pain specialist. He or she may prescribe medicine or therapies, such as:   Mindful meditation or yoga.  Shots (injections) of numbing or pain-relieving medicines into the spine or area of pain.  Local electrical stimulation.  Acupuncture.   Massage therapy.   Aroma, color, light, or sound therapy.   Biofeedback.   Working with a physical therapist to keep from getting stiff.   Regular, gentle exercise.   Cognitive or behavioral therapy.   Group support.  Sometimes, surgery may be recommended.  HOME CARE INSTRUCTIONS    Take all medicines as directed by your health care provider.   Lessen stress in your life by relaxing and doing things such as listening to calming music.   Exercise or be active as directed by your health care provider.   Eat a healthy diet and include things such as vegetables, fruits, fish, and lean meats in your diet.   Keep all follow-up appointments with your health care provider.   Attend a support group with others suffering from chronic pain. SEEK MEDICAL CARE IF:   Your pain gets worse.   You develop a new pain that was not there before.   You cannot tolerate medicines given to you by your health care provider.   You have new symptoms since your last visit with your health care provider.  SEEK IMMEDIATE MEDICAL CARE IF:   You feel weak.   You have decreased sensation or numbness.   You lose control of bowel or bladder function.   Your pain suddenly gets much worse.   You develop shaking.  You develop chills.  You develop confusion.  You develop chest pain.  You develop shortness of breath.  MAKE SURE YOU:  Understand these instructions.  Will watch your condition.  Will get help right away if you are not doing well or get worse.   This information is not intended to replace advice given to you by your health care provider. Make sure you discuss any questions you have with your health care provider.   Document Released: 11/28/2001  Document Revised: 11/08/2012 Document Reviewed: 09/01/2012 Elsevier Interactive Patient Education 2016 Elsevier Inc. Chronic Back Pain  When back pain lasts longer than 3 months, it is called chronic back pain.People with chronic back pain often go through certain periods that are more intense (flare-ups).  CAUSES Chronic back pain can be caused by wear and tear (degeneration) on different structures in your back. These structures include:  The bones of your spine (vertebrae) and the joints surrounding  your spinal cord and nerve roots (facets).  The strong, fibrous tissues that connect your vertebrae (ligaments). Degeneration of these structures may result in pressure on your nerves. This can lead to constant pain. HOME CARE INSTRUCTIONS  Avoid bending, heavy lifting, prolonged sitting, and activities which make the problem worse.  Take brief periods of rest throughout the day to reduce your pain. Lying down or standing usually is better than sitting while you are resting.  Take over-the-counter or prescription medicines only as directed by your caregiver. SEEK IMMEDIATE MEDICAL CARE IF:   You have weakness or numbness in one of your legs or feet.  You have trouble controlling your bladder or bowels.  You have nausea, vomiting, abdominal pain, shortness of breath, or fainting.   This information is not intended to replace advice given to you by your health care provider. Make sure you discuss any questions you have with your health care provider.   Document Released: 04/15/2004 Document Revised: 05/31/2011 Document Reviewed: 08/26/2014 Elsevier Interactive Patient Education [employer].

## 2016-01-22 NOTE — Progress Notes (Signed)
Patient is here for [place] and 2hd opinion surgery.  Patient has not taken medication today. Patient has eaten today.  Patient complains of left hip and leg pain being present. Pain is scaled at an 8 currently.  Patient falls multiple times a week. Patient states last fall caused him to knock a tooth loose.  Patient has presented with mobility paperwork to be assessed and completed for a W/C.  No suicidal ideations at this time.

## 2016-01-22 NOTE — Telephone Encounter (Signed)
Patient called to speak with nurse or PCP regarding a bathroom chair that he really needs. Pt forgot to mention this at his appt. Pt mentioned that most accidents happen in the bathroom therefore he is needing this. Please follow up.   Thank you

## 2016-01-23 ENCOUNTER — Telehealth: Payer: Self-pay | Admitting: Internal Medicine

## 2016-01-23 NOTE — Telephone Encounter (Signed)
Done, Patient may pick it up from front office

## 2016-01-23 NOTE — Telephone Encounter (Signed)
Paperwork is being faxed right now

## 2016-01-23 NOTE — Telephone Encounter (Signed)
Could you write a hand prescription for a shower chair?

## 2016-01-23 NOTE — Telephone Encounter (Signed)
Company is calling regarding power chair paperwork...  Patient dropped of paperwork during office visit..  Company would like to know how long it would take to receive paperwork.  Please advised

## 2016-01-23 NOTE — Telephone Encounter (Signed)
Thank you :)

## 2016-02-04 NOTE — Telephone Encounter (Signed)
Prescription is in the medication book

## 2016-02-04 NOTE — Telephone Encounter (Signed)
Pt. Called requesting an Rx for a shower chair. Please f/u

## 2016-03-18 ENCOUNTER — Telehealth: Payer: Self-pay | Admitting: Internal Medicine

## 2016-03-18 NOTE — Telephone Encounter (Signed)
Pt calling requesting his prescription be faxed to [facility]  Fax [phone]  Pt states that in addition to the Rx, [employer] needs his insurance information and demographic information faxed as well

## 2016-03-18 NOTE — Telephone Encounter (Signed)
Previous note is regarding the Rx for pts shower chair

## 2016-03-18 NOTE — Telephone Encounter (Signed)
Thank you! I will pull it from the medication binder and send.

## 2016-03-18 NOTE — Telephone Encounter (Signed)
No prescription was listed in this message. Did patient complete a release of information?

## 2016-04-16 ENCOUNTER — Telehealth: Payer: Self-pay | Admitting: Internal Medicine

## 2016-04-16 NOTE — Telephone Encounter (Signed)
Pt calling stating he is applying for [employer] for transportation and they need confirmation from PCP that he is in a wheelchair and unable to drive

## 2016-04-16 NOTE — Telephone Encounter (Signed)
MA unable to reach patient at the residence which is a hotel room number.  !!!Please inform patient of completing the PART A portion of the SCAT application which he picked up today. The physicians portion will be completed and placed at the front desk for pick up in order for patient to sign his portion and mail in the form for [employer] for review!!!

## 2016-05-19 ENCOUNTER — Ambulatory Visit: Payer: Medicaid Other | Attending: Internal Medicine | Admitting: Internal Medicine

## 2016-05-19 VITALS — BP 111/75 | HR 80 | Temp 98.4°F | Resp 18 | Ht 69.0 in | Wt 186.2 lb

## 2016-05-19 DIAGNOSIS — G8929 Other chronic pain: Secondary | ICD-10-CM

## 2016-05-19 DIAGNOSIS — M5442 Lumbago with sciatica, left side: Secondary | ICD-10-CM | POA: Insufficient documentation

## 2016-05-19 DIAGNOSIS — M542 Cervicalgia: Secondary | ICD-10-CM | POA: Diagnosis not present

## 2016-05-19 DIAGNOSIS — Z79899 Other long term (current) drug therapy: Secondary | ICD-10-CM | POA: Diagnosis not present

## 2016-05-19 DIAGNOSIS — Z888 Allergy status to other drugs, medicaments and biological substances status: Secondary | ICD-10-CM | POA: Insufficient documentation

## 2016-05-19 DIAGNOSIS — G40909 Epilepsy, unspecified, not intractable, without status epilepticus: Secondary | ICD-10-CM

## 2016-05-19 DIAGNOSIS — E785 Hyperlipidemia, unspecified: Secondary | ICD-10-CM | POA: Diagnosis not present

## 2016-05-19 DIAGNOSIS — F329 Major depressive disorder, single episode, unspecified: Secondary | ICD-10-CM | POA: Diagnosis not present

## 2016-05-19 DIAGNOSIS — G894 Chronic pain syndrome: Secondary | ICD-10-CM | POA: Diagnosis not present

## 2016-05-19 DIAGNOSIS — M545 Low back pain: Secondary | ICD-10-CM | POA: Diagnosis not present

## 2016-05-19 DIAGNOSIS — B1920 Unspecified viral hepatitis C without hepatic coma: Secondary | ICD-10-CM | POA: Insufficient documentation

## 2016-05-19 LAB — COMPLETE METABOLIC PANEL WITH GFR
ALT: 12 U/L (ref 9–46)
AST: 17 U/L (ref 10–35)
Albumin: 4.7 g/dL (ref 3.6–5.1)
Alkaline Phosphatase: 53 U/L (ref 40–115)
BILIRUBIN TOTAL: 0.4 mg/dL (ref 0.2–1.2)
BUN: 11 mg/dL (ref 7–25)
CO2: 22 mmol/L (ref 20–31)
CREATININE: 0.86 mg/dL (ref 0.70–1.33)
Calcium: 9.7 mg/dL (ref 8.6–10.3)
Chloride: 105 mmol/L (ref 98–110)
GFR, Est Non African American: >89 mL/min (ref >=60)
Glucose, Bld: 86 mg/dL (ref 65–99)
Potassium: 3.9 mmol/L (ref 3.5–5.3)
Sodium: 141 mmol/L (ref 135–146)
TOTAL PROTEIN: 7.5 g/dL (ref 6.1–8.1)

## 2016-05-19 LAB — LIPID PANEL
Cholesterol: 196 mg/dL (ref <200)
HDL: 60 mg/dL (ref >40)
LDL CALC: 105 mg/dL — AB (ref <100)
TRIGLYCERIDES: 154 mg/dL — AB (ref <150)
Total CHOL/HDL Ratio: 3.3 Ratio (ref <5.0)
VLDL: 31 mg/dL — ABNORMAL HIGH (ref <30)

## 2016-05-19 LAB — TSH: TSH: 0.68 m[IU]/L (ref 0.40–4.50)

## 2016-05-19 MED ORDER — PRAVASTATIN SODIUM 40 MG PO TABS: 40.0000 mg | ORAL_TABLET | Freq: Every day | ORAL | 3 refills | Status: AC

## 2016-05-19 MED ORDER — TRAZODONE HCL 100 MG PO TABS: 100.0000 mg | ORAL_TABLET | Freq: Every day | ORAL | 3 refills | Status: DC

## 2016-05-19 MED ORDER — DIVALPROEX SODIUM ER 500 MG PO TB24: 500.0000 mg | ORAL_TABLET | Freq: Three times a day (TID) | ORAL | 3 refills | Status: DC

## 2016-05-19 MED ORDER — METHOCARBAMOL 500 MG PO TABS: 500.0000 mg | ORAL_TABLET | Freq: Two times a day (BID) | ORAL | 3 refills | Status: DC

## 2016-05-19 MED ORDER — ACETAMINOPHEN-CODEINE #3 300-30 MG PO TABS: 1.0000 | ORAL_TABLET | ORAL | 0 refills | Status: DC | PRN

## 2016-05-19 NOTE — Progress Notes (Signed)
Patient is here for Back Pain  Patient complains of lower back pain being present and scaled currently at a 5.  Patient has taken medication today. Patient has eaten today.

## 2016-05-19 NOTE — Patient Instructions (Signed)

## 2016-05-21 NOTE — Progress Notes (Signed)
Cody Delacruz, is a 51 y.o. male  [id number]  [id number]  DOB - 1965/04/19  Chief Complaint  Patient presents with  . Back Pain       Subjective:   Cody Delacruz is a 51 y.o. male with history of chronic low back pain, chronic neck pain, dyslipidemia, seizure disorder and major depression here today for a follow up visit, further evaluation of pain and medication refills. He finally got his motorized wheelchair which has impacted his ADL positively, but he continues to have significant pain and wonders if he could be reevaluated with MRI to determine extent of his disease. Patient has No headache, No chest pain, No abdominal pain - No Nausea, No new weakness tingling or numbness, No Cough - SOB.  No problems updated.  ALLERGIES: Allergies  Allergen Reactions  . Codeine Other (See Comments)    Unknown childhood allergy    PAST MEDICAL HISTORY: Past Medical History:  Diagnosis Date  . Back pain   . Depression   . Neck pain   . Other acariasis    Hep C  . Seizures ([place])     MEDICATIONS AT HOME: Prior to Admission medications   Medication Sig Start Date End Date Taking? Authorizing Provider  acetaminophen-codeine (TYLENOL #3) 300-30 MG tablet Take 1 tablet by mouth every 4 (four) hours as needed. 05/19/16  Yes Tresa Garter, MD  divalproex (DEPAKOTE ER) 500 MG 24 hr tablet Take 1 tablet (500 mg total) by mouth 3 (three) times daily. 05/19/16  Yes Tresa Garter, MD  methocarbamol (ROBAXIN) 500 MG tablet Take 1 tablet (500 mg total) by mouth 2 (two) times daily. 05/19/16  Yes Tresa Garter, MD  Multiple Vitamin (MULTIVITAMIN WITH MINERALS) TABS Take 1 tablet by mouth daily. Reported on 03/10/2015   Yes Historical Provider, MD  omeprazole (PRILOSEC) 20 MG capsule Take 2 capsules (40 mg total) by mouth daily. 01/22/16  Yes Tresa Garter, MD  pravastatin (PRAVACHOL) 40 MG tablet Take 1 tablet (40 mg total) by mouth daily. 05/19/16  Yes Tresa Garter, MD  propranolol (INDERAL) 20 MG tablet Take 20 mg by mouth 2 (two) times daily. Reported on 03/10/2015   Yes Historical Provider, MD  sertraline (ZOLOFT) 100 MG tablet Take 1 tablet (100 mg total) by mouth daily. 01/22/16  Yes Tresa Garter, MD  Thiamine HCl (VITAMIN B-1) 250 MG tablet Take 250 mg by mouth daily. Reported on 03/10/2015   Yes Historical Provider, MD  traZODone (DESYREL) 100 MG tablet Take 1 tablet (100 mg total) by mouth at bedtime. 05/19/16  Yes Tresa Garter, MD    Objective:   Vitals:   05/19/16 1423  BP: 111/75  Pulse: 80  Resp: 18  Temp: 98.4 F (36.9 C)  TempSrc: Oral  SpO2: 95%  Weight: 186 lb 3.2 oz (84.5 kg)  Height: 5\' 9"  (1.753 m)   Exam General appearance : Awake, alert, not in any distress. Speech Clear. Not toxic looking, on wheelchair HEENT: Atraumatic and Normocephalic, pupils equally reactive to light and accomodation Neck: Supple, no JVD. No cervical lymphadenopathy.  Chest: Good air entry bilaterally, no added sounds  CVS: S1 S2 regular, no murmurs.  Abdomen: Bowel sounds present, Non tender and not distended with no gaurding, rigidity or rebound. Extremities: B/L Lower Ext shows no edema, both legs are warm to touch Neurology: Awake alert, and oriented X 3, CN II-XII intact, Non focal Skin: No Rash  Data Review No results found for:  HGBA1C  Assessment & Plan   1. Chronic bilateral low back pain with left-sided sciatica  - traZODone (DESYREL) 100 MG tablet; Take 1 tablet (100 mg total) by mouth at bedtime.  Dispense: 30 tablet; Refill: 3 - methocarbamol (ROBAXIN) 500 MG tablet; Take 1 tablet (500 mg total) by mouth 2 (two) times daily.  Dispense: 30 tablet; Refill: 3 - acetaminophen-codeine (TYLENOL #3) 300-30 MG tablet; Take 1 tablet by mouth every 4 (four) hours as needed.  Dispense: 60 tablet; Refill: 0 - MR Lumbar Spine Wo Contrast; Future  2. Chronic pain syndrome  - COMPLETE METABOLIC PANEL WITH GFR - TSH  3.  Dyslipidemia  - pravastatin (PRAVACHOL) 40 MG tablet; Take 1 tablet (40 mg total) by mouth daily.  Dispense: 90 tablet; Refill: 3 - Lipid panel  4. Neck pain, chronic  - traZODone (DESYREL) 100 MG tablet; Take 1 tablet (100 mg total) by mouth at bedtime.  Dispense: 30 tablet; Refill: 3 - methocarbamol (ROBAXIN) 500 MG tablet; Take 1 tablet (500 mg total) by mouth 2 (two) times daily.  Dispense: 30 tablet; Refill: 3 - MR Cervical Spine Wo Contrast; Future  5. Chronic bilateral low back pain without sciatica  - methocarbamol (ROBAXIN) 500 MG tablet; Take 1 tablet (500 mg total) by mouth 2 (two) times daily.  Dispense: 30 tablet; Refill: 3 - acetaminophen-codeine (TYLENOL #3) 300-30 MG tablet; Take 1 tablet by mouth every 4 (four) hours as needed.  Dispense: 60 tablet; Refill: 0  6. Seizure disorder ([facility])  - divalproex (DEPAKOTE ER) 500 MG 24 hr tablet; Take 1 tablet (500 mg total) by mouth 3 (three) times daily.  Dispense: 270 tablet; Refill: 3  Patient have been counseled extensively about nutrition and exercise. Other issues discussed during this visit include: low cholesterol diet, weight control and daily exercise, foot care, annual eye examinations at Ophthalmology, importance of adherence with medications and regular follow-up. We also discussed long term complications of uncontrolled diabetes and hypertension.   Return in about 6 months (around 11/16/2016) for Follow up Pain and comorbidities, Routine Follow Up.  The patient was given clear instructions to go to ER or return to medical center if symptoms don't improve, worsen or new problems develop. The patient verbalized understanding. The patient was told to call to get lab results if they haven't heard anything in the next week.   This note has been created with Surveyor, quantity. Any transcriptional errors are unintentional.    Angelica Chessman, MD, [place], [place], [place], [place] and [place], [place]   05/21/2016, 7:00 PM

## 2016-05-24 ENCOUNTER — Telehealth: Payer: Self-pay | Admitting: Internal Medicine

## 2016-05-24 NOTE — Telephone Encounter (Signed)
Patient verified DOB Patient is aware of lab results being normal. Patient is also aware of MRI being scheduled and was able to repeat the day and time. No further questions at this time.

## 2016-05-24 NOTE — Telephone Encounter (Signed)
Pt requesting a call to go over MRI details and what he needs to bring Pt can be reached at [phone] and ext. [place]

## 2016-06-01 ENCOUNTER — Telehealth: Payer: Self-pay | Admitting: Internal Medicine

## 2016-06-01 NOTE — Telephone Encounter (Signed)
Patient received letter from [facility] regarding MRI's ordered.... Medicaid denied mri. Patient has appointment tomorrow

## 2016-06-01 NOTE — Telephone Encounter (Signed)
Thank you for FYI

## 2016-06-02 ENCOUNTER — Ambulatory Visit (HOSPITAL_COMMUNITY)
Admission: RE | Admit: 2016-06-02 | Discharge: 2016-06-02 | Disposition: A | Discharge status: Home / Self Care | Payer: Medicaid Other | Source: Ambulatory Visit | Attending: Internal Medicine | Admitting: Internal Medicine

## 2016-06-02 DIAGNOSIS — M4802 Spinal stenosis, cervical region: Secondary | ICD-10-CM | POA: Insufficient documentation

## 2016-06-02 DIAGNOSIS — M47892 Other spondylosis, cervical region: Secondary | ICD-10-CM | POA: Diagnosis not present

## 2016-06-02 DIAGNOSIS — M542 Cervicalgia: Principal | ICD-10-CM

## 2016-06-02 DIAGNOSIS — G8929 Other chronic pain: Secondary | ICD-10-CM | POA: Insufficient documentation

## 2016-06-02 DIAGNOSIS — M4807 Spinal stenosis, lumbosacral region: Secondary | ICD-10-CM | POA: Diagnosis not present

## 2016-06-02 DIAGNOSIS — M5442 Lumbago with sciatica, left side: Secondary | ICD-10-CM | POA: Insufficient documentation

## 2016-06-08 ENCOUNTER — Telehealth: Payer: Self-pay | Admitting: *Deleted

## 2016-06-08 NOTE — Telephone Encounter (Signed)
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 The cervical spine MRI showed no acute abnormality.No abnormal cord signal. There is moderate left-sided narrowing at different levels but no high-grade narrowing. We will continue pain management for now and possibly physical therapy. If pain worsens or persists for too long, the surgery may be an option.

## 2016-06-08 NOTE — Telephone Encounter (Signed)
Patient verified DOB Patient is aware of MRI showing some progressive disc degeneration, although it is stable from the past year, it does show minimal increase. Patient is also aware of no acute findings being made but moderate narrowing was noticed. Patient advised to continue with clinic pain management and possible physical therapy. Patient is aware of surgery being an option if pain persist or increases.

## 2016-06-10 ENCOUNTER — Other Ambulatory Visit: Payer: Self-pay | Admitting: Internal Medicine

## 2016-06-10 DIAGNOSIS — G894 Chronic pain syndrome: Secondary | ICD-10-CM

## 2016-06-10 NOTE — Telephone Encounter (Signed)
Ordered. Thanks

## 2016-06-15 NOTE — Telephone Encounter (Signed)
Thank you :)

## 2016-06-16 ENCOUNTER — Telehealth: Payer: Self-pay | Admitting: Internal Medicine

## 2016-06-16 ENCOUNTER — Ambulatory Visit: Payer: Medicaid Other | Attending: Internal Medicine | Admitting: Physical Therapy

## 2016-06-16 DIAGNOSIS — M545 Low back pain: Secondary | ICD-10-CM | POA: Insufficient documentation

## 2016-06-16 DIAGNOSIS — G8929 Other chronic pain: Secondary | ICD-10-CM | POA: Insufficient documentation

## 2016-06-16 DIAGNOSIS — M5442 Lumbago with sciatica, left side: Principal | ICD-10-CM

## 2016-06-16 NOTE — Telephone Encounter (Signed)
Patient called the office asking to speak with PCP regarding physical therapy. Because patient has Medicaid as his insurance pt was informed that he will only be given one visit for PT. He is able to have 3 more visits after he gets surgery. Pt is concerned about this. Also, pt needs a refill on his Tylenol 3.  He stated that he spoke with nurse and was informed that if medication can be sent electronically then it can be home delivered to him. Please follow up.  Thank you.

## 2016-06-16 NOTE — Therapy (Signed)
[place], [place], [address] Phone: [phone]   Fax:  [phone]  Physical Therapy Treatment  Patient Details  Name: Cody Delacruz MRN: [id number] Date of Birth: 01/14/1966 Referring Provider: Tresa Garter MD  Encounter Date: 06/16/2016    Past Medical History:  Diagnosis Date  . Back pain   . Depression   . Neck pain   . Other acariasis    Hep C  . Seizures ([place])     Past Surgical History:  Procedure Laterality Date  . HERNIA REPAIR    . JOINT REPLACEMENT      There were no vitals filed for this visit.          [facility] PT Assessment - 06/16/16 0825      Assessment   Medical Diagnosis Chronic pain disorder   Referring Provider Tresa Garter MD                                         Plan - 06/16/16 0900    Clinical Impression Statement pt opted to save the 1 provided Medicaid visit for a later date. He reports his MD stated he needs surgery so he wanted to save his visits for after surgery and did not go thorough the evaluation.       Patient will benefit from skilled therapeutic intervention in order to improve the following deficits and impairments:     Visit Diagnosis: Chronic bilateral low back pain, with sciatica presence unspecified     Problem List Patient Active Problem List   Diagnosis Date Noted  . Numbness and tingling in both hands 01/22/2016  . Chronic pain syndrome 01/22/2016  . Assistance needed for mobility 01/22/2016  . Healthcare maintenance 03/10/2015  . Dyslipidemia 08/26/2014  . Bilateral low back pain with left-sided sciatica 02/21/2014  . GERD (gastroesophageal reflux disease) 05/01/2013  . Seizure disorder ([place]) 05/01/2013  . Neck pain, chronic 10/31/2012  . Chronic bilateral low back pain with left-sided sciatica 10/31/2012   Starr Lake PT, DPT, LAT, ATC  06/16/16  9:04 AM      [facility] [address] [place], [place], [address] Phone: [phone]   Fax:  [phone]  Name: Cody Delacruz MRN: [id number] Date of Birth: 03-31-1965

## 2016-06-16 NOTE — Telephone Encounter (Signed)
Refill for Tramadol. Patient is worried about medicaid only providing a total of 3 days of PT

## 2016-06-17 ENCOUNTER — Other Ambulatory Visit: Payer: Self-pay | Admitting: Internal Medicine

## 2016-06-24 NOTE — Telephone Encounter (Signed)
Patient calling requesting to speak to nurse Did not disclose what it was in regards to

## 2016-06-24 NOTE — Telephone Encounter (Signed)
Patient verified DOB Patient states he received 1 day of PT and was provided home exercises due to medicaid only paying for one visit. Patient is also requesting the refill on Tylenol 3.

## 2016-06-25 ENCOUNTER — Other Ambulatory Visit: Payer: Self-pay | Admitting: Internal Medicine

## 2016-06-25 DIAGNOSIS — G8929 Other chronic pain: Secondary | ICD-10-CM

## 2016-06-25 DIAGNOSIS — M5442 Lumbago with sciatica, left side: Principal | ICD-10-CM

## 2016-06-25 MED ORDER — ACETAMINOPHEN-CODEINE #3 300-30 MG PO TABS: 1.0000 | ORAL_TABLET | ORAL | 0 refills | Status: DC | PRN

## 2016-07-15 ENCOUNTER — Telehealth: Payer: Self-pay | Admitting: Internal Medicine

## 2016-07-15 NOTE — Telephone Encounter (Signed)
Pt. Called requesting to speak with nurse regarding his MRI. Pt. States that the nurse was going to call him back regarding a surgeon and he states that he has not heard from her. Please f/u with pt.

## 2016-07-19 NOTE — Telephone Encounter (Signed)
Patient states he only received one day of PT and is requesting a referral to the surgeon for further treatment.

## 2016-07-29 NOTE — Telephone Encounter (Signed)
Please advise on surgery after results from MRI

## 2016-07-29 NOTE — Telephone Encounter (Signed)
Pt. Called requesting to speak with his nurse regarding surgery for his back and neck. Pt. States he had an MRI done, and that his nurse was going to call back with more information regarding the surgery. Please f/u

## 2016-08-06 NOTE — Telephone Encounter (Signed)
Medical Assistant left message on patient's home and cell voicemail. Voicemail states to give a call back to [place] with [facility] at [phone]. MA informed patient of referral being placed to surgery since the PT was only for one visit.

## 2016-08-07 ENCOUNTER — Emergency Department (HOSPITAL_COMMUNITY): Payer: Medicaid Other

## 2016-08-07 ENCOUNTER — Inpatient Hospital Stay (HOSPITAL_COMMUNITY)
Admission: EM | Admit: 2016-08-07 | Discharge: 2016-08-09 | DRG: 101 | Disposition: A | Discharge status: Home / Self Care | Payer: Medicaid Other | Attending: Internal Medicine | Admitting: Internal Medicine

## 2016-08-07 ENCOUNTER — Encounter (HOSPITAL_COMMUNITY): Payer: Self-pay | Admitting: Emergency Medicine

## 2016-08-07 DIAGNOSIS — F1210 Cannabis abuse, uncomplicated: Secondary | ICD-10-CM | POA: Diagnosis present

## 2016-08-07 DIAGNOSIS — Y908 Blood alcohol level of 240 mg/100 ml or more: Secondary | ICD-10-CM | POA: Diagnosis present

## 2016-08-07 DIAGNOSIS — E876 Hypokalemia: Secondary | ICD-10-CM | POA: Diagnosis present

## 2016-08-07 DIAGNOSIS — E785 Hyperlipidemia, unspecified: Secondary | ICD-10-CM | POA: Diagnosis present

## 2016-08-07 DIAGNOSIS — F10239 Alcohol dependence with withdrawal, unspecified: Secondary | ICD-10-CM | POA: Diagnosis present

## 2016-08-07 DIAGNOSIS — F1010 Alcohol abuse, uncomplicated: Secondary | ICD-10-CM | POA: Diagnosis present

## 2016-08-07 DIAGNOSIS — W19XXXA Unspecified fall, initial encounter: Secondary | ICD-10-CM

## 2016-08-07 DIAGNOSIS — Z9119 Patient's noncompliance with other medical treatment and regimen: Secondary | ICD-10-CM

## 2016-08-07 DIAGNOSIS — F10929 Alcohol use, unspecified with intoxication, unspecified: Secondary | ICD-10-CM

## 2016-08-07 DIAGNOSIS — R569 Unspecified convulsions: Secondary | ICD-10-CM

## 2016-08-07 DIAGNOSIS — G8929 Other chronic pain: Secondary | ICD-10-CM | POA: Diagnosis present

## 2016-08-07 DIAGNOSIS — G40919 Epilepsy, unspecified, intractable, without status epilepticus: Principal | ICD-10-CM | POA: Diagnosis present

## 2016-08-07 DIAGNOSIS — Z885 Allergy status to narcotic agent status: Secondary | ICD-10-CM

## 2016-08-07 DIAGNOSIS — Z8619 Personal history of other infectious and parasitic diseases: Secondary | ICD-10-CM

## 2016-08-07 DIAGNOSIS — F10229 Alcohol dependence with intoxication, unspecified: Secondary | ICD-10-CM | POA: Diagnosis present

## 2016-08-07 DIAGNOSIS — Z9660 Presence of unspecified orthopedic joint implant: Secondary | ICD-10-CM | POA: Diagnosis present

## 2016-08-07 DIAGNOSIS — F17210 Nicotine dependence, cigarettes, uncomplicated: Secondary | ICD-10-CM | POA: Diagnosis present

## 2016-08-07 DIAGNOSIS — Z79899 Other long term (current) drug therapy: Secondary | ICD-10-CM

## 2016-08-07 DIAGNOSIS — Z720 Tobacco use: Secondary | ICD-10-CM | POA: Diagnosis present

## 2016-08-07 DIAGNOSIS — K219 Gastro-esophageal reflux disease without esophagitis: Secondary | ICD-10-CM | POA: Diagnosis present

## 2016-08-07 DIAGNOSIS — Z9114 Patient's other noncompliance with medication regimen: Secondary | ICD-10-CM

## 2016-08-07 DIAGNOSIS — F32A Depression, unspecified: Secondary | ICD-10-CM | POA: Diagnosis present

## 2016-08-07 DIAGNOSIS — M5442 Lumbago with sciatica, left side: Secondary | ICD-10-CM | POA: Diagnosis present

## 2016-08-07 DIAGNOSIS — F329 Major depressive disorder, single episode, unspecified: Secondary | ICD-10-CM | POA: Diagnosis present

## 2016-08-07 LAB — COMPREHENSIVE METABOLIC PANEL
ALBUMIN: 4 g/dL (ref 3.5–5.0)
ALT: 22 U/L (ref 17–63)
ANION GAP: 11 (ref 5–15)
AST: 29 U/L (ref 15–41)
Alkaline Phosphatase: 45 U/L (ref 38–126)
BUN: 7 mg/dL (ref 6–20)
CHLORIDE: 110 mmol/L (ref 101–111)
CO2: 22 mmol/L (ref 22–32)
Calcium: 8.3 mg/dL — ABNORMAL LOW (ref 8.9–10.3)
Creatinine, Ser: 0.97 mg/dL (ref 0.61–1.24)
GFR calc Af Amer: >60 mL/min (ref >60)
GFR calc non Af Amer: >60 mL/min (ref >60)
GLUCOSE: 97 mg/dL (ref 65–99)
POTASSIUM: 3.4 mmol/L — AB (ref 3.5–5.1)
Sodium: 143 mmol/L (ref 135–145)
Total Bilirubin: 0.5 mg/dL (ref 0.3–1.2)
Total Protein: 6.2 g/dL — ABNORMAL LOW (ref 6.5–8.1)

## 2016-08-07 LAB — CBC WITH DIFFERENTIAL/PLATELET
Basophils Absolute: 0 10*3/uL (ref 0.0–0.1)
Basophils Relative: 1 %
EOS ABS: 0.2 10*3/uL (ref 0.0–0.7)
Eosinophils Relative: 4 %
HEMATOCRIT: 37.8 % — AB (ref 39.0–52.0)
Hemoglobin: 12.7 g/dL — ABNORMAL LOW (ref 13.0–17.0)
LYMPHS ABS: 2.6 10*3/uL (ref 0.7–4.0)
Lymphocytes Relative: 50 %
MCH: 32.1 pg (ref 26.0–34.0)
MCHC: 33.6 g/dL (ref 30.0–36.0)
MCV: 95.5 fL (ref 78.0–100.0)
MONOS PCT: 10 %
Monocytes Absolute: 0.5 10*3/uL (ref 0.1–1.0)
Neutro Abs: 1.8 10*3/uL (ref 1.7–7.7)
Neutrophils Relative %: 35 %
Platelets: 118 10*3/uL — ABNORMAL LOW (ref 150–400)
RBC: 3.96 MIL/uL — AB (ref 4.22–5.81)
RDW: 12.7 % (ref 11.5–15.5)
WBC: 5.2 10*3/uL (ref 4.0–10.5)

## 2016-08-07 LAB — CBG MONITORING, ED: GLUCOSE-CAPILLARY: 108 mg/dL — AB (ref 65–99)

## 2016-08-07 LAB — ETHANOL: ALCOHOL ETHYL (B): 319 mg/dL — AB (ref <5)

## 2016-08-07 MED ORDER — SODIUM CHLORIDE 0.9 % IV BOLUS (SEPSIS)
500.0000 mL | Freq: Once | INTRAVENOUS | Status: AC
  Administered 2016-08-07: 500 mL via INTRAVENOUS

## 2016-08-07 MED ORDER — SODIUM CHLORIDE 0.9 % IV SOLN
1000.0000 mg | Freq: Once | INTRAVENOUS | Status: AC
  Administered 2016-08-08: 1000 mg via INTRAVENOUS
  Filled 2016-08-07: qty 10

## 2016-08-07 MED ORDER — LORAZEPAM 2 MG/ML IJ SOLN
INTRAMUSCULAR | Status: AC
  Administered 2016-08-07: 2 mg
  Filled 2016-08-07: qty 1

## 2016-08-07 NOTE — ED Provider Notes (Signed)
[facility] DEPT Provider Note   CSN: [id number] Arrival date & time: 08/07/16  2216  By signing my name below, I, Dolores Hoose, attest that this documentation has been prepared under the direction and in the presence of Sherwood Gambler, MD . Electronically Signed: Dolores Hoose, Scribe. 08/07/2016. 10:58 PM.  History   Chief Complaint Chief Complaint  Patient presents with  . Seizures   The history is provided by the patient, the EMS personnel and medical records. No language interpreter was used.    HPI Comments:  LEVEL 5 CAVEAT DUE TO INTOXICATION  Cody Delacruz is a 51 y.o. male with pmhx of seizures brought in by ambulance, who presents to the Emergency Department with reports of multiple, witnessed seizures onset just prior to EMS arrival. Per EMS and nursing, patient had 1 seizure before their arrival, 5 with EMS and 2 seizures here in the ED. He was given a total of 5mg  of VERSED with minimal relief. Pt reports associated wound to his lower lip and right arm pain, onset 2 weeks. EtOH (3 drinks) onboard. Denies any abdominal pain.    Past Medical History:  Diagnosis Date  . Back pain   . Depression   . Neck pain   . Other acariasis    Hep C  . Seizures ([place])     Patient Active Problem List   Diagnosis Date Noted  . Depression 08/08/2016  . Tobacco abuse 08/08/2016  . Alcohol abuse 08/08/2016  . Seizure ([place]) 08/08/2016  . Hypokalemia 08/08/2016  . Alcoholic intoxication with complication ([place])   . Numbness and tingling in both hands 01/22/2016  . Chronic pain syndrome 01/22/2016  . Assistance needed for mobility 01/22/2016  . Healthcare maintenance 03/10/2015  . Dyslipidemia 08/26/2014  . Bilateral low back pain with left-sided sciatica 02/21/2014  . GERD (gastroesophageal reflux disease) 05/01/2013  . Seizure disorder ([place]) 05/01/2013  . Neck pain, chronic 10/31/2012  . Chronic bilateral low back pain with left-sided sciatica 10/31/2012    Past Surgical  History:  Procedure Laterality Date  . HERNIA REPAIR    . JOINT REPLACEMENT         Home Medications    Prior to Admission medications   Medication Sig Start Date End Date Taking? Authorizing Provider  acetaminophen-codeine (TYLENOL #3) 300-30 MG tablet Take 1 tablet by mouth every 4 (four) hours as needed. 06/25/16   Tresa Garter, MD  divalproex (DEPAKOTE ER) 500 MG 24 hr tablet Take 1 tablet (500 mg total) by mouth 3 (three) times daily. 05/19/16   Tresa Garter, MD  methocarbamol (ROBAXIN) 500 MG tablet Take 1 tablet (500 mg total) by mouth 2 (two) times daily. 05/19/16   Tresa Garter, MD  Multiple Vitamin (MULTIVITAMIN WITH MINERALS) TABS Take 1 tablet by mouth daily. Reported on 03/10/2015    [provider]  omeprazole (PRILOSEC) 20 MG capsule Take 2 capsules (40 mg total) by mouth daily. 01/22/16   Tresa Garter, MD  pravastatin (PRAVACHOL) 40 MG tablet Take 1 tablet (40 mg total) by mouth daily. 05/19/16   Tresa Garter, MD  propranolol (INDERAL) 20 MG tablet Take 20 mg by mouth 2 (two) times daily. Reported on 03/10/2015    [provider]  sertraline (ZOLOFT) 100 MG tablet Take 1 tablet (100 mg total) by mouth daily. 01/22/16   Tresa Garter, MD  Thiamine HCl (VITAMIN B-1) 250 MG tablet Take 250 mg by mouth daily. Reported on 03/10/2015    [provider]  traZODone (DESYREL) 100 MG tablet Take 1 tablet (100 mg total) by mouth at bedtime. 05/19/16   Tresa Garter, MD    Family History Family History  Problem Relation Age of Onset  . Cancer Mother   . Cancer Father   . Cancer Sister   . Cancer Brother   . Cancer Maternal Grandmother     Social History Social History  Substance Use Topics  . Smoking status: Current Every Day Smoker    Packs/day: 0.25    Years: 40.00  . Smokeless tobacco: Not on file     Comment: smoking 5 cigs/day  . Alcohol use 8.4 oz/week    14 Cans of beer per week      Comment: pt states "2 24oz daily when I can"     Allergies   Codeine   Review of Systems Review of Systems  Unable to perform ROS: Acuity of condition   Physical Exam Updated Vital Signs BP 101/65   Pulse 85   Temp 98.7 F (37.1 C) (Oral)   Resp 17   Ht 5\' 9"  (1.753 m)   Wt 174 lb (78.9 kg)   SpO2 98%   BMI 25.70 kg/m   Physical Exam  Constitutional: He appears well-developed and well-nourished.  Awake, alert, and confused.   HENT:  Head: Normocephalic.  Right Ear: External ear normal.  Left Ear: External ear normal.  Nose: Nose normal.  Mild right sided lower-lip swelling an ecchymosis. No tongue injury.   Eyes: Right eye exhibits no discharge. Left eye exhibits no discharge.  Neck: Neck supple.  Cardiovascular: Normal rate, regular rhythm and normal heart sounds.   2+ radial pulses bilaterally.   Pulmonary/Chest: Effort normal and breath sounds normal.  Abdominal: Soft. There is no tenderness.  Musculoskeletal: He exhibits tenderness. He exhibits no edema.  Right anterior upper arm tenderness and pain with ROM of right elbow. No significant swelling.  Right thigh pain when lifting right lower extremity. Mild diffuse thigh and hip tenderness.   Neurological: He is alert.  Awake and alert but confused. 5/5 strength in left upper extremity and left lower extremity. Has movement in right sided extremities but is painful and strength testing is limited. No facial droop.   Skin: Skin is warm and dry. He is not diaphoretic.  Nursing note and vitals reviewed.    ED Treatments / Results  DIAGNOSTIC STUDIES:  Oxygen Saturation is 95% on RA, adequate by my interpretation.    COORDINATION OF CARE:  11:09 PM Discussed treatment plan with pt at bedside which includes blood work and Ativan and pt agreed to plan.  Labs (all labs ordered are listed, but only abnormal results are displayed) Labs Reviewed  COMPREHENSIVE METABOLIC PANEL - Abnormal; Notable for the  following:       Result Value   Potassium 3.4 (*)    Calcium 8.3 (*)    Total Protein 6.2 (*)    All other components within normal limits  ETHANOL - Abnormal; Notable for the following:    Alcohol, Ethyl (B) 319 (*)    All other components within normal limits  CBC WITH DIFFERENTIAL/PLATELET - Abnormal; Notable for the following:    RBC 3.96 (*)    Hemoglobin 12.7 (*)    HCT 37.8 (*)    Platelets 118 (*)    All other components within normal limits  RAPID URINE DRUG SCREEN, HOSP PERFORMED - Abnormal; Notable for the following:    Benzodiazepines POSITIVE (*)  Tetrahydrocannabinol POSITIVE (*)    All other components within normal limits  VALPROIC ACID LEVEL - Abnormal; Notable for the following:    Valproic Acid Lvl <10 (*)    All other components within normal limits  CBG MONITORING, ED - Abnormal; Notable for the following:    Glucose-Capillary 108 (*)    All other components within normal limits  URINALYSIS, ROUTINE W REFLEX MICROSCOPIC  MAGNESIUM  HIV ANTIBODY (ROUTINE TESTING)  BASIC METABOLIC PANEL  CBC    EKG  EKG Interpretation None       Radiology Dg Pelvis 1-2 Views  Result Date: 08/08/2016 CLINICAL DATA:  Fall.  Right thigh pain. EXAM: PELVIS - 1-2 VIEW COMPARISON:  None. FINDINGS: The cortical margins of the bony pelvis are intact. No fracture. Pubic symphysis and sacroiliac joints are congruent. Both femoral heads are well-seated in the respective acetabula. Mild age related degenerative change of the hips. IMPRESSION: No pelvic fracture.  Minimal osteoarthritis of both hips. Electronically Signed   By: Jeb Levering M.D.   On: 08/08/2016 01:23   Ct Head Wo Contrast  Result Date: 08/08/2016 CLINICAL DATA:  Seizures, right arm/ leg pain EXAM: CT HEAD WITHOUT CONTRAST CT CERVICAL SPINE WITHOUT CONTRAST TECHNIQUE: Multidetector CT imaging of the head and cervical spine was performed following the standard protocol without intravenous contrast. Multiplanar  CT image reconstructions of the cervical spine were also generated. COMPARISON:  MRI cervical spine dated 06/02/2016. Correlation with CT head report dated 06/28/2009. FINDINGS: CT HEAD FINDINGS Brain: No evidence of acute infarction, hemorrhage, hydrocephalus, extra-axial collection or mass lesion/mass effect. Mild cortical atrophy. Vascular: No hyperdense vessel or unexpected calcification. Skull: Normal. Negative for fracture or focal lesion. Sinuses/Orbits: The visualized paranasal sinuses are essentially clear. The mastoid air cells are unopacified. Other: None. CT CERVICAL SPINE FINDINGS Alignment: Normal cervical lordosis. Skull base and vertebrae: No acute fracture. No primary bone lesion or focal pathologic process. Soft tissues and spinal canal: No prevertebral fluid or swelling. No visible canal hematoma. Disc levels: Vertebral body heights and intervertebral disc spaces are maintained. Spinal canal is patent. Upper chest: Visualized lung apices are clear. Other: Visualized thyroid is unremarkable. IMPRESSION: No evidence of acute intracranial abnormality. Mild cortical atrophy. No evidence of traumatic injury to the cervical spine. Electronically Signed   By: Julian Hy M.D.   On: 08/08/2016 00:15   Ct Cervical Spine Wo Contrast  Result Date: 08/08/2016 CLINICAL DATA:  Seizures, right arm/ leg pain EXAM: CT HEAD WITHOUT CONTRAST CT CERVICAL SPINE WITHOUT CONTRAST TECHNIQUE: Multidetector CT imaging of the head and cervical spine was performed following the standard protocol without intravenous contrast. Multiplanar CT image reconstructions of the cervical spine were also generated. COMPARISON:  MRI cervical spine dated 06/02/2016. Correlation with CT head report dated 06/28/2009. FINDINGS: CT HEAD FINDINGS Brain: No evidence of acute infarction, hemorrhage, hydrocephalus, extra-axial collection or mass lesion/mass effect. Mild cortical atrophy. Vascular: No hyperdense vessel or unexpected  calcification. Skull: Normal. Negative for fracture or focal lesion. Sinuses/Orbits: The visualized paranasal sinuses are essentially clear. The mastoid air cells are unopacified. Other: None. CT CERVICAL SPINE FINDINGS Alignment: Normal cervical lordosis. Skull base and vertebrae: No acute fracture. No primary bone lesion or focal pathologic process. Soft tissues and spinal canal: No prevertebral fluid or swelling. No visible canal hematoma. Disc levels: Vertebral body heights and intervertebral disc spaces are maintained. Spinal canal is patent. Upper chest: Visualized lung apices are clear. Other: Visualized thyroid is unremarkable. IMPRESSION: No evidence of acute intracranial  abnormality. Mild cortical atrophy. No evidence of traumatic injury to the cervical spine. Electronically Signed   By: Julian Hy M.D.   On: 08/08/2016 00:15   Dg Chest Portable 1 View  Result Date: 08/07/2016 CLINICAL DATA:  Seizures x1 day EXAM: PORTABLE CHEST 1 VIEW COMPARISON:  None. FINDINGS: The heart size and mediastinal contours are within normal limits. There is minimal atelectasis at the lung bases. The visualized skeletal structures are unremarkable. IMPRESSION: No active disease. Electronically Signed   By: Ashley Royalty M.D.   On: 08/07/2016 23:46   Dg Humerus Right  Result Date: 08/08/2016 CLINICAL DATA:  Right arm pain.  Unclear if injury. EXAM: RIGHT HUMERUS - 2+ VIEW COMPARISON:  None. FINDINGS: Cortical margins of the humerus are intact. There is no evidence of fracture or suspicious focal bone lesions. An 18 processes noted about the distal humerus, normal variant. Soft tissues are unremarkable. IMPRESSION: No acute abnormality. Electronically Signed   By: Jeb Levering M.D.   On: 08/08/2016 01:22   Dg Femur Min 2 Views Right  Result Date: 08/08/2016 CLINICAL DATA:  Right thigh pain.  Unclear if injury. EXAM: RIGHT FEMUR 2 VIEWS COMPARISON:  None. FINDINGS: Cortical margins of the right femur are  intact. There is no evidence of fracture or other focal bone lesions. Soft tissues are unremarkable. IMPRESSION: Negative radiographs of the right femur. Electronically Signed   By: Jeb Levering M.D.   On: 08/08/2016 01:24    Procedures Procedures (including critical care time)  Medications Ordered in ED Medications  famotidine (PEPCID) IVPB 20 mg premix (20 mg Intravenous New Bag/Given 08/08/16 0244)  LORazepam (ATIVAN) injection 1 mg (not administered)  ondansetron (ZOFRAN) injection 4 mg (not administered)  0.9 %  sodium chloride infusion ( Intravenous New Bag/Given 08/08/16 0244)  LORazepam (ATIVAN) tablet 1 mg (not administered)    Or  LORazepam (ATIVAN) injection 1 mg (not administered)  thiamine (VITAMIN B-1) tablet 100 mg (not administered)    Or  thiamine (B-1) injection 100 mg (not administered)  folic acid (FOLVITE) tablet 1 mg (not administered)  multivitamin with minerals tablet 1 tablet (not administered)  LORazepam (ATIVAN) injection 0-4 mg (not administered)    Followed by  LORazepam (ATIVAN) injection 0-4 mg (not administered)  enoxaparin (LOVENOX) injection 40 mg (not administered)  sodium chloride flush (NS) 0.9 % injection 3 mL (3 mLs Intravenous Given 08/08/16 0244)  LORazepam (ATIVAN) 2 MG/ML injection (2 mg  Given 08/07/16 2309)  sodium chloride 0.9 % bolus 500 mL (0 mLs Intravenous Stopped 08/08/16 0016)  levETIRAcetam (KEPPRA) 1,000 mg in sodium chloride 0.9 % 100 mL IVPB (0 mg Intravenous Stopped 08/08/16 0015)  potassium chloride 10 mEq in 100 mL IVPB (0 mEq Intravenous Stopped 08/08/16 0222)  sodium chloride 0.9 % bolus 1,000 mL (1,000 mLs Intravenous New Bag/Given 08/08/16 0027)     Initial Impression / Assessment and Plan / ED Course  I have reviewed the triage vital signs and the nursing notes.  Pertinent labs & imaging results that were available during my care of the patient were reviewed by me and considered in my medical decision making (see chart  for details).  Clinical Course as of Aug 08 256  Sat Aug 07, 2016  2322 Patient had a brief seizure while I was in the room. Will give IV ativan, as well as a keppra load. Check labs, CXR, urine, and CT head/c-spine given right sided extremity pain, ?neuropathic  [SG]    Clinical Course User  Index [SG] Sherwood Gambler, MD    No further seizure activity after IV Ativan. He has now quite sleepy and snoring, likely combination of the Ativan, postictal state, and alcohol intoxication. I think his increased seizures today are due to noncompliance with his medicines with an undetectable Depakote level. Doubt infectious cause. Unclear why he is having right arm and leg pain, there is mild muscular tenderness, possibly this is from his seizures. Otherwise there does not appeared to be an acute neuro deficit. Patient will be admitted to the hospitalist, Dr. Nicole Kindred of neurology was consulted as well.  Final Clinical Impressions(s) / ED Diagnoses   Final diagnoses:  Seizures ([place])  Alcoholic intoxication with complication [facility])    New Prescriptions New Prescriptions   No medications on file   I personally performed the services described in this documentation, which was scribed in my presence. The recorded information has been reviewed and is accurate.     Sherwood Gambler, MD 08/08/16 [phone]

## 2016-08-07 NOTE — ED Triage Notes (Signed)
Pt presents with [facility] for seizures with hx of same; reportedly pt experienced one seizure on scene prior to EMS arrival, then 5 other seizures with EMS present; 5mg  VERSED given by EMS enroute; pt alert and oriented to person, situation- disoriented to time and place

## 2016-08-07 NOTE — ED Notes (Signed)
Bedside x-ray at this time.

## 2016-08-07 NOTE — ED Notes (Signed)
ED Provider at bedside. 

## 2016-08-08 ENCOUNTER — Emergency Department (HOSPITAL_COMMUNITY): Payer: Medicaid Other

## 2016-08-08 DIAGNOSIS — G8929 Other chronic pain: Secondary | ICD-10-CM | POA: Diagnosis present

## 2016-08-08 DIAGNOSIS — F1010 Alcohol abuse, uncomplicated: Secondary | ICD-10-CM | POA: Diagnosis not present

## 2016-08-08 DIAGNOSIS — F10239 Alcohol dependence with withdrawal, unspecified: Secondary | ICD-10-CM | POA: Diagnosis present

## 2016-08-08 DIAGNOSIS — K219 Gastro-esophageal reflux disease without esophagitis: Secondary | ICD-10-CM

## 2016-08-08 DIAGNOSIS — E876 Hypokalemia: Secondary | ICD-10-CM | POA: Diagnosis present

## 2016-08-08 DIAGNOSIS — Z885 Allergy status to narcotic agent status: Secondary | ICD-10-CM | POA: Diagnosis not present

## 2016-08-08 DIAGNOSIS — G40909 Epilepsy, unspecified, not intractable, without status epilepticus: Secondary | ICD-10-CM

## 2016-08-08 DIAGNOSIS — F1210 Cannabis abuse, uncomplicated: Secondary | ICD-10-CM | POA: Diagnosis present

## 2016-08-08 DIAGNOSIS — F10229 Alcohol dependence with intoxication, unspecified: Secondary | ICD-10-CM | POA: Diagnosis present

## 2016-08-08 DIAGNOSIS — F17210 Nicotine dependence, cigarettes, uncomplicated: Secondary | ICD-10-CM | POA: Diagnosis present

## 2016-08-08 DIAGNOSIS — Z9114 Patient's other noncompliance with medication regimen: Secondary | ICD-10-CM | POA: Diagnosis not present

## 2016-08-08 DIAGNOSIS — Z8619 Personal history of other infectious and parasitic diseases: Secondary | ICD-10-CM | POA: Diagnosis not present

## 2016-08-08 DIAGNOSIS — Y908 Blood alcohol level of 240 mg/100 ml or more: Secondary | ICD-10-CM | POA: Diagnosis present

## 2016-08-08 DIAGNOSIS — Z720 Tobacco use: Secondary | ICD-10-CM | POA: Diagnosis present

## 2016-08-08 DIAGNOSIS — M5442 Lumbago with sciatica, left side: Secondary | ICD-10-CM

## 2016-08-08 DIAGNOSIS — R569 Unspecified convulsions: Secondary | ICD-10-CM

## 2016-08-08 DIAGNOSIS — Z9660 Presence of unspecified orthopedic joint implant: Secondary | ICD-10-CM | POA: Diagnosis present

## 2016-08-08 DIAGNOSIS — F329 Major depressive disorder, single episode, unspecified: Secondary | ICD-10-CM | POA: Diagnosis present

## 2016-08-08 DIAGNOSIS — G40919 Epilepsy, unspecified, intractable, without status epilepticus: Secondary | ICD-10-CM | POA: Diagnosis not present

## 2016-08-08 DIAGNOSIS — F10929 Alcohol use, unspecified with intoxication, unspecified: Secondary | ICD-10-CM | POA: Diagnosis not present

## 2016-08-08 DIAGNOSIS — Z9119 Patient's noncompliance with other medical treatment and regimen: Secondary | ICD-10-CM | POA: Diagnosis not present

## 2016-08-08 DIAGNOSIS — Z79899 Other long term (current) drug therapy: Secondary | ICD-10-CM | POA: Diagnosis not present

## 2016-08-08 DIAGNOSIS — E785 Hyperlipidemia, unspecified: Secondary | ICD-10-CM | POA: Diagnosis present

## 2016-08-08 DIAGNOSIS — F32A Depression, unspecified: Secondary | ICD-10-CM | POA: Diagnosis present

## 2016-08-08 LAB — HIV ANTIBODY (ROUTINE TESTING W REFLEX): HIV Screen 4th Generation wRfx: NONREACTIVE

## 2016-08-08 LAB — RAPID URINE DRUG SCREEN, HOSP PERFORMED
AMPHETAMINES: NOT DETECTED
BARBITURATES: NOT DETECTED
Benzodiazepines: POSITIVE — AB
COCAINE: NOT DETECTED
OPIATES: NOT DETECTED
Tetrahydrocannabinol: POSITIVE — AB

## 2016-08-08 LAB — URINALYSIS, ROUTINE W REFLEX MICROSCOPIC
BILIRUBIN URINE: NEGATIVE
GLUCOSE, UA: NEGATIVE mg/dL
Hgb urine dipstick: NEGATIVE
KETONES UR: NEGATIVE mg/dL
Leukocytes, UA: NEGATIVE
Nitrite: NEGATIVE
PH: 5 (ref 5.0–8.0)
PROTEIN: NEGATIVE mg/dL
Specific Gravity, Urine: 1.016 (ref 1.005–1.030)

## 2016-08-08 LAB — MAGNESIUM: Magnesium: 2 mg/dL (ref 1.7–2.4)

## 2016-08-08 LAB — CBC
HEMATOCRIT: 35.7 % — AB (ref 39.0–52.0)
Hemoglobin: 12.1 g/dL — ABNORMAL LOW (ref 13.0–17.0)
MCH: 32.4 pg (ref 26.0–34.0)
MCHC: 33.9 g/dL (ref 30.0–36.0)
MCV: 95.7 fL (ref 78.0–100.0)
Platelets: 112 10*3/uL — ABNORMAL LOW (ref 150–400)
RBC: 3.73 MIL/uL — ABNORMAL LOW (ref 4.22–5.81)
RDW: 12.8 % (ref 11.5–15.5)
WBC: 6.5 10*3/uL (ref 4.0–10.5)

## 2016-08-08 LAB — BASIC METABOLIC PANEL
ANION GAP: 8 (ref 5–15)
BUN: 6 mg/dL (ref 6–20)
CHLORIDE: 113 mmol/L — AB (ref 101–111)
CO2: 21 mmol/L — ABNORMAL LOW (ref 22–32)
Calcium: 7.5 mg/dL — ABNORMAL LOW (ref 8.9–10.3)
Creatinine, Ser: 0.79 mg/dL (ref 0.61–1.24)
Glucose, Bld: 99 mg/dL (ref 65–99)
POTASSIUM: 4 mmol/L (ref 3.5–5.1)
Sodium: 142 mmol/L (ref 135–145)

## 2016-08-08 LAB — CK: Total CK: 92 U/L (ref 49–397)

## 2016-08-08 LAB — CBG MONITORING, ED
GLUCOSE-CAPILLARY: 81 mg/dL (ref 65–99)
Glucose-Capillary: 58 mg/dL — ABNORMAL LOW (ref 65–99)
Glucose-Capillary: 168 mg/dL — ABNORMAL HIGH (ref 65–99)

## 2016-08-08 LAB — VALPROIC ACID LEVEL: Valproic Acid Lvl: <10 ug/mL — ABNORMAL LOW (ref 50.0–100.0)

## 2016-08-08 LAB — MRSA PCR SCREENING: MRSA by PCR: NEGATIVE

## 2016-08-08 MED ORDER — LORAZEPAM 2 MG/ML IJ SOLN
0.0000 mg | Freq: Four times a day (QID) | INTRAMUSCULAR | Status: DC
  Administered 2016-08-08 (×2): 1 mg via INTRAVENOUS
  Filled 2016-08-08: qty 1
  Filled 2016-08-08: qty 2

## 2016-08-08 MED ORDER — SODIUM CHLORIDE 0.9% FLUSH
3.0000 mL | Freq: Two times a day (BID) | INTRAVENOUS | Status: DC
  Administered 2016-08-08: 3 mL via INTRAVENOUS

## 2016-08-08 MED ORDER — POTASSIUM CHLORIDE 10 MEQ/100ML IV SOLN
10.0000 meq | Freq: Once | INTRAVENOUS | Status: AC
  Administered 2016-08-08: 10 meq via INTRAVENOUS
  Filled 2016-08-08: qty 100

## 2016-08-08 MED ORDER — DEXTROSE 50 % IV SOLN
1.0000 | Freq: Once | INTRAVENOUS | Status: AC
  Administered 2016-08-08: 50 mL via INTRAVENOUS
  Filled 2016-08-08: qty 50

## 2016-08-08 MED ORDER — LORAZEPAM 2 MG/ML IJ SOLN
2.0000 mg | INTRAMUSCULAR | Status: DC | PRN
  Administered 2016-08-08: 2 mg via INTRAVENOUS
  Filled 2016-08-08: qty 1

## 2016-08-08 MED ORDER — ONDANSETRON HCL 4 MG/2ML IJ SOLN: 4.0000 mg | Freq: Three times a day (TID) | INTRAMUSCULAR | Status: DC | PRN

## 2016-08-08 MED ORDER — LORAZEPAM 2 MG/ML IJ SOLN
1.0000 mg | INTRAMUSCULAR | Status: DC | PRN
  Administered 2016-08-08: 1 mg via INTRAVENOUS

## 2016-08-08 MED ORDER — LORAZEPAM 2 MG/ML IJ SOLN: 0.0000 mg | Freq: Two times a day (BID) | INTRAMUSCULAR | Status: DC

## 2016-08-08 MED ORDER — THIAMINE HCL 100 MG/ML IJ SOLN
100.0000 mg | Freq: Every day | INTRAMUSCULAR | Status: DC
  Administered 2016-08-08 – 2016-08-09 (×2): 100 mg via INTRAVENOUS
  Filled 2016-08-08 (×2): qty 2

## 2016-08-08 MED ORDER — FAMOTIDINE IN NACL 20-0.9 MG/50ML-% IV SOLN
20.0000 mg | Freq: Two times a day (BID) | INTRAVENOUS | Status: DC
  Administered 2016-08-08 – 2016-08-09 (×3): 20 mg via INTRAVENOUS
  Filled 2016-08-08 (×3): qty 50

## 2016-08-08 MED ORDER — FOLIC ACID 1 MG PO TABS: 1.0000 mg | ORAL_TABLET | Freq: Every day | ORAL | Status: DC

## 2016-08-08 MED ORDER — LORAZEPAM 1 MG PO TABS
1.0000 mg | ORAL_TABLET | Freq: Four times a day (QID) | ORAL | Status: DC | PRN
  Administered 2016-08-09: 1 mg via ORAL
  Filled 2016-08-08: qty 1

## 2016-08-08 MED ORDER — SODIUM CHLORIDE 0.9 % IV BOLUS (SEPSIS)
1000.0000 mL | Freq: Once | INTRAVENOUS | Status: AC
  Administered 2016-08-08: 1000 mL via INTRAVENOUS

## 2016-08-08 MED ORDER — KCL IN DEXTROSE-NACL 20-5-0.9 MEQ/L-%-% IV SOLN
INTRAVENOUS | Status: DC
  Administered 2016-08-08 – 2016-08-09 (×2): via INTRAVENOUS
  Filled 2016-08-08 (×3): qty 1000

## 2016-08-08 MED ORDER — SODIUM CHLORIDE 0.9 % IV SOLN
INTRAVENOUS | Status: DC
  Administered 2016-08-08: 03:00:00 via INTRAVENOUS

## 2016-08-08 MED ORDER — VITAMIN B-1 100 MG PO TABS: 100.0000 mg | ORAL_TABLET | Freq: Every day | ORAL | Status: DC

## 2016-08-08 MED ORDER — ADULT MULTIVITAMIN W/MINERALS CH: 1.0000 | ORAL_TABLET | Freq: Every day | ORAL | Status: DC

## 2016-08-08 MED ORDER — VALPROATE SODIUM 500 MG/5ML IV SOLN
500.0000 mg | Freq: Three times a day (TID) | INTRAVENOUS | Status: DC
  Administered 2016-08-08 – 2016-08-09 (×4): 500 mg via INTRAVENOUS
  Filled 2016-08-08 (×6): qty 5

## 2016-08-08 MED ORDER — LORAZEPAM 2 MG/ML IJ SOLN
1.0000 mg | Freq: Four times a day (QID) | INTRAMUSCULAR | Status: DC | PRN
  Filled 2016-08-08: qty 1

## 2016-08-08 MED ORDER — ENOXAPARIN SODIUM 40 MG/0.4ML ~~LOC~~ SOLN
40.0000 mg | SUBCUTANEOUS | Status: DC
  Administered 2016-08-08: 40 mg via SUBCUTANEOUS
  Filled 2016-08-08 (×2): qty 0.4

## 2016-08-08 NOTE — H&P (Signed)
History and Physical    Cody Delacruz NTZ:[id number] DOB: 1965-04-14 DOA: 08/07/2016  Referring MD/NP/PA:   PCP: Tresa Garter, MD   Patient coming from:  The patient is coming from home.  At baseline, pt is independent for most of ADL.  Chief Complaint: Seizure  HPI: Cody Delacruz is a 51 y.o. male with medical history significant of polysubstance abuse (tobacco, alcohol and marijuana), hyperlipidemia, GERD, depression, seizure, chronic back pain, who presents with seizure.  Patient is alcohol intoxicated and in post ictal status, and is unable to provide accurate medical history, therefore, most of the history is obtained by discussing the case with ED physician, per EMS report, and with the nursing staff. Per report, patient had multiple seizures at home, "one seizure on scene prior to EMS arrival, then 5 other seizures with EMS present; 5mg  VERSED given by EMS enroute". Has EtOH (3 drinks) onboard. Pt has right arm pain. When I saw pt in ED, he is confused and intoxicated, wakes up on rubbing his sternum, denies chest pain or abdominal pain. No active cough, nausea, vomiting or diarrhea noted. He moves all extremities.  ED Course: pt was found to alcohol level 319, valproic acid level<10 which is subtherapeutic,  WBC 5.2, positive UDS for THC and benzo, potassium 3.4, creatinine normal, negative urinalysis, temperature normal, O2 sat are 93-99% on room air. Patient had negative images, including right femur x-ray, pelvis x-ray, right humerus x-ray, CT head and CT of C-spine. Patient is placed on stepdown bed as inpatient. Neurology, Dr. Nicole Kindred was consulted. Patient was loaded with 1 g of Keppra.  Review of Systems: Could not be reviewed due to alcohol intoxication and postictal status.   Allergy:  Allergies  Allergen Reactions  . Codeine Other (See Comments)    Unknown childhood allergy    Past Medical History:  Diagnosis Date  . Back pain   . Depression   . Neck pain     . Other acariasis    Hep C  . Seizures ([place])     Past Surgical History:  Procedure Laterality Date  . HERNIA REPAIR    . JOINT REPLACEMENT      Social History:  reports that he has been smoking.  He has a 10.00 pack-year smoking history. He does not have any smokeless tobacco history on file. He reports that he drinks about 8.4 oz of alcohol per week . He reports that he uses drugs, including Marijuana.  Family History:  Family History  Problem Relation Age of Onset  . Cancer Mother   . Cancer Father   . Cancer Sister   . Cancer Brother   . Cancer Maternal Grandmother      Prior to Admission medications   Medication Sig Start Date End Date Taking? Authorizing Provider  acetaminophen-codeine (TYLENOL #3) 300-30 MG tablet Take 1 tablet by mouth every 4 (four) hours as needed. 06/25/16   Tresa Garter, MD  divalproex (DEPAKOTE ER) 500 MG 24 hr tablet Take 1 tablet (500 mg total) by mouth 3 (three) times daily. 05/19/16   Tresa Garter, MD  methocarbamol (ROBAXIN) 500 MG tablet Take 1 tablet (500 mg total) by mouth 2 (two) times daily. 05/19/16   Tresa Garter, MD  Multiple Vitamin (MULTIVITAMIN WITH MINERALS) TABS Take 1 tablet by mouth daily. Reported on 03/10/2015    [provider]  omeprazole (PRILOSEC) 20 MG capsule Take 2 capsules (40 mg total) by mouth daily. 01/22/16   Tresa Garter, MD  pravastatin (PRAVACHOL) 40 MG tablet Take 1 tablet (40 mg total) by mouth daily. 05/19/16   Tresa Garter, MD  propranolol (INDERAL) 20 MG tablet Take 20 mg by mouth 2 (two) times daily. Reported on 03/10/2015    [provider]  sertraline (ZOLOFT) 100 MG tablet Take 1 tablet (100 mg total) by mouth daily. 01/22/16   Tresa Garter, MD  Thiamine HCl (VITAMIN B-1) 250 MG tablet Take 250 mg by mouth daily. Reported on 03/10/2015    [provider]  traZODone (DESYREL) 100 MG tablet Take 1 tablet (100 mg total) by mouth at bedtime.  05/19/16   Tresa Garter, MD    Physical Exam: Vitals:   08/08/16 0415 08/08/16 0445 08/08/16 0500 08/08/16 0515  BP: 102/85 111/77 123/85 107/72  Pulse: 86 84 80 71  Resp: 20 14 12 13   Temp:      TempSrc:      SpO2: 98% 98% 99% 99%  Weight:      Height:       General: Not in acute distress HEENT:       Eyes: PERRL, EOMI, no scleral icterus.       ENT: No discharge from the ears and nose, no pharynx injection, no tonsillar enlargement.        Neck: No JVD, no bruit, no mass felt. Heme: No neck lymph node enlargement. Cardiac: S1/S2, RRR, No murmurs, No gallops or rubs. Respiratory: No rales, wheezing, rhonchi or rubs. GI: Soft, nondistended, nontender, no organomegaly, BS present. GU: No hematuria Ext: No pitting leg edema bilaterally. 2+DP/PT pulse bilaterally. Musculoskeletal: No joint deformities, No joint redness or warmth, no limitation of ROM in spin. Skin: No rashes.  Neuro: alcohol intoxicated and in post ictal status, not oriented X3, cranial nerves II-XII grossly intact, moves all extremities normally. Psych: Patient is not psychotic, no suicidal or hemocidal ideation.  Labs on Admission: I have personally reviewed following labs and imaging studies  CBC:  Recent Labs Lab 08/07/16 2225 08/08/16 0231  WBC 5.2 6.5  NEUTROABS 1.8  --   HGB 12.7* 12.1*  HCT 37.8* 35.7*  MCV 95.5 95.7  PLT 118* 371*   Basic Metabolic Panel:  Recent Labs Lab 08/07/16 2225 08/08/16 0015 08/08/16 0231  NA 143  --  142  K 3.4*  --  4.0  CL 110  --  113*  CO2 22  --  21*  GLUCOSE 97  --  99  BUN 7  --  6  CREATININE 0.97  --  0.79  CALCIUM 8.3*  --  7.5*  MG  --  2.0  --    GFR: Estimated Creatinine Clearance: 108 mL/min (by C-G formula based on SCr of 0.79 mg/dL). Liver Function Tests:  Recent Labs Lab 08/07/16 2225  AST 29  ALT 22  ALKPHOS 45  BILITOT 0.5  PROT 6.2*  ALBUMIN 4.0   No results for input(s): LIPASE, AMYLASE in the last 168 hours. No  results for input(s): AMMONIA in the last 168 hours. Coagulation Profile: No results for input(s): INR, PROTIME in the last 168 hours. Cardiac Enzymes:  Recent Labs Lab 08/08/16 0307  CKTOTAL 92   BNP (last 3 results) No results for input(s): PROBNP in the last 8760 hours. HbA1C: No results for input(s): HGBA1C in the last 72 hours. CBG:  Recent Labs Lab 08/07/16 2313  GLUCAP 108*   Lipid Profile: No results for input(s): CHOL, HDL, LDLCALC, TRIG, CHOLHDL, LDLDIRECT in the last 72  hours. Thyroid Function Tests: No results for input(s): TSH, T4TOTAL, FREET4, T3FREE, THYROIDAB in the last 72 hours. Anemia Panel: No results for input(s): VITAMINB12, FOLATE, FERRITIN, TIBC, IRON, RETICCTPCT in the last 72 hours. Urine analysis:    Component Value Date/Time   COLORURINE YELLOW 08/08/2016 0015   APPEARANCEUR CLEAR 08/08/2016 0015   LABSPEC 1.016 08/08/2016 0015   PHURINE 5.0 08/08/2016 0015   GLUCOSEU NEGATIVE 08/08/2016 0015   HGBUR NEGATIVE 08/08/2016 0015   BILIRUBINUR NEGATIVE 08/08/2016 0015   KETONESUR NEGATIVE 08/08/2016 0015   PROTEINUR NEGATIVE 08/08/2016 0015   NITRITE NEGATIVE 08/08/2016 0015   LEUKOCYTESUR NEGATIVE 08/08/2016 0015   Sepsis Labs: @LABRCNTIP (procalcitonin:4,lacticidven:4) )No results found for this or any previous visit (from the past 240 hour(s)).   Radiological Exams on Admission: Dg Pelvis 1-2 Views  Result Date: 08/08/2016 CLINICAL DATA:  Fall.  Right thigh pain. EXAM: PELVIS - 1-2 VIEW COMPARISON:  None. FINDINGS: The cortical margins of the bony pelvis are intact. No fracture. Pubic symphysis and sacroiliac joints are congruent. Both femoral heads are well-seated in the respective acetabula. Mild age related degenerative change of the hips. IMPRESSION: No pelvic fracture.  Minimal osteoarthritis of both hips. Electronically Signed   By: Jeb Levering M.D.   On: 08/08/2016 01:23   Ct Head Wo Contrast  Result Date: 08/08/2016 CLINICAL  DATA:  Seizures, right arm/ leg pain EXAM: CT HEAD WITHOUT CONTRAST CT CERVICAL SPINE WITHOUT CONTRAST TECHNIQUE: Multidetector CT imaging of the head and cervical spine was performed following the standard protocol without intravenous contrast. Multiplanar CT image reconstructions of the cervical spine were also generated. COMPARISON:  MRI cervical spine dated 06/02/2016. Correlation with CT head report dated 06/28/2009. FINDINGS: CT HEAD FINDINGS Brain: No evidence of acute infarction, hemorrhage, hydrocephalus, extra-axial collection or mass lesion/mass effect. Mild cortical atrophy. Vascular: No hyperdense vessel or unexpected calcification. Skull: Normal. Negative for fracture or focal lesion. Sinuses/Orbits: The visualized paranasal sinuses are essentially clear. The mastoid air cells are unopacified. Other: None. CT CERVICAL SPINE FINDINGS Alignment: Normal cervical lordosis. Skull base and vertebrae: No acute fracture. No primary bone lesion or focal pathologic process. Soft tissues and spinal canal: No prevertebral fluid or swelling. No visible canal hematoma. Disc levels: Vertebral body heights and intervertebral disc spaces are maintained. Spinal canal is patent. Upper chest: Visualized lung apices are clear. Other: Visualized thyroid is unremarkable. IMPRESSION: No evidence of acute intracranial abnormality. Mild cortical atrophy. No evidence of traumatic injury to the cervical spine. Electronically Signed   By: Julian Hy M.D.   On: 08/08/2016 00:15   Ct Cervical Spine Wo Contrast  Result Date: 08/08/2016 CLINICAL DATA:  Seizures, right arm/ leg pain EXAM: CT HEAD WITHOUT CONTRAST CT CERVICAL SPINE WITHOUT CONTRAST TECHNIQUE: Multidetector CT imaging of the head and cervical spine was performed following the standard protocol without intravenous contrast. Multiplanar CT image reconstructions of the cervical spine were also generated. COMPARISON:  MRI cervical spine dated 06/02/2016.  Correlation with CT head report dated 06/28/2009. FINDINGS: CT HEAD FINDINGS Brain: No evidence of acute infarction, hemorrhage, hydrocephalus, extra-axial collection or mass lesion/mass effect. Mild cortical atrophy. Vascular: No hyperdense vessel or unexpected calcification. Skull: Normal. Negative for fracture or focal lesion. Sinuses/Orbits: The visualized paranasal sinuses are essentially clear. The mastoid air cells are unopacified. Other: None. CT CERVICAL SPINE FINDINGS Alignment: Normal cervical lordosis. Skull base and vertebrae: No acute fracture. No primary bone lesion or focal pathologic process. Soft tissues and spinal canal: No prevertebral fluid or swelling. No  visible canal hematoma. Disc levels: Vertebral body heights and intervertebral disc spaces are maintained. Spinal canal is patent. Upper chest: Visualized lung apices are clear. Other: Visualized thyroid is unremarkable. IMPRESSION: No evidence of acute intracranial abnormality. Mild cortical atrophy. No evidence of traumatic injury to the cervical spine. Electronically Signed   By: Julian Hy M.D.   On: 08/08/2016 00:15   Dg Chest Portable 1 View  Result Date: 08/07/2016 CLINICAL DATA:  Seizures x1 day EXAM: PORTABLE CHEST 1 VIEW COMPARISON:  None. FINDINGS: The heart size and mediastinal contours are within normal limits. There is minimal atelectasis at the lung bases. The visualized skeletal structures are unremarkable. IMPRESSION: No active disease. Electronically Signed   By: Ashley Royalty M.D.   On: 08/07/2016 23:46   Dg Humerus Right  Result Date: 08/08/2016 CLINICAL DATA:  Right arm pain.  Unclear if injury. EXAM: RIGHT HUMERUS - 2+ VIEW COMPARISON:  None. FINDINGS: Cortical margins of the humerus are intact. There is no evidence of fracture or suspicious focal bone lesions. An 18 processes noted about the distal humerus, normal variant. Soft tissues are unremarkable. IMPRESSION: No acute abnormality. Electronically Signed    By: Jeb Levering M.D.   On: 08/08/2016 01:22   Dg Femur Min 2 Views Right  Result Date: 08/08/2016 CLINICAL DATA:  Right thigh pain.  Unclear if injury. EXAM: RIGHT FEMUR 2 VIEWS COMPARISON:  None. FINDINGS: Cortical margins of the right femur are intact. There is no evidence of fracture or other focal bone lesions. Soft tissues are unremarkable. IMPRESSION: Negative radiographs of the right femur. Electronically Signed   By: Jeb Levering M.D.   On: 08/08/2016 01:24     EKG: Independently reviewed.  Sinus rhythm, QTC 444, early R-wave progression   Assessment/Plan Principal Problem:   Seizure (HCC) Active Problems:   GERD (gastroesophageal reflux disease)   Bilateral low back pain with left-sided sciatica   Dyslipidemia   Depression   Tobacco abuse   Alcohol abuse   Hypokalemia   Seizure: Patient had multiple seizures. CT head is negative. Most likely due to medication noncompliance given his subtherapeutic valproic acid level. Neurology, Dr. Nicole Kindred was consulted, recommended to resume Depacon. Pt was loaded with 1g of keppra in ED.  -Will admit patient to stepdown unit for observation -Highly appreciate Dr. Nicole Kindred consultation -Depacon 500 mg 3 times a day, now we'll start by IV-->Need to change to oral whe mental status improves.  -Frequent neuro check -hold oral meds until mental status improves.  GERD: -Pepcid IV  Dyslipidemia: -hold home meds now  Hypokalemia: K=3.4 on admission. - Repleted  Depression:  -Hold oral home medications now   Tobacco abuse and Alcohol abuse: -Nicotine patch -CIWA protocol   DVT ppx: SQ Lovenox Code Status: Full code Family Communication: None at bed side. Disposition Plan:  Anticipate discharge back to previous home environment Consults called:  Neurology, Dr. Nicole Kindred was consulted Admission status:  SDU/inpation       Date of Service 08/08/2016    Ivor Costa [facility] Pager [phone]  If 7PM-7AM,  please contact night-coverage [email or web address] Password [email or web address] 08/08/2016, 5:35 AM

## 2016-08-08 NOTE — ED Notes (Signed)
Spoke to Dr. Carolynne Edouard about pt's seizure. PRN ativan dose changed to 2mg . Pt resting at this time.

## 2016-08-08 NOTE — Consult Note (Signed)
Admission H&P    Chief Complaint: Recurrent generalized seizures.  HPI: Cody Delacruz is an 51 y.o. male with a history of alcohol abuse and seizure disorder, as well as depression, presenting with multiple seizures occurring prior to and after arriving in the ED. Patient also has continued to drink alcohol. Her alcohol level was 319. Urine drug screen was positive for THC.Marland Kitchen Urine was also positive for benzodiazepines. However patient was given 5 mg of Versed in route to the ED. CT scan of his head showed no acute intracranial abnormality. He was given a loading dose of Keppra 1000 mg IV. He has been prescribed Depakote 500 mg 3 times a day. Depakote level was undetectable. Patient is being admitted for seizure control as well as management of alcohol withdrawal.  Past Medical History:  Diagnosis Date  . Back pain   . Depression   . Neck pain   . Other acariasis    Hep C  . Seizures ([place])     Past Surgical History:  Procedure Laterality Date  . HERNIA REPAIR    . JOINT REPLACEMENT      Family History  Problem Relation Age of Onset  . Cancer Mother   . Cancer Father   . Cancer Sister   . Cancer Brother   . Cancer Maternal Grandmother    Social History:  reports that he has been smoking.  He has a 10.00 pack-year smoking history. He does not have any smokeless tobacco history on file. He reports that he drinks about 8.4 oz of alcohol per week . He reports that he uses drugs, including Marijuana.  Allergies:  Allergies  Allergen Reactions  . Codeine Other (See Comments)    Unknown childhood allergy    Medications: Preadmission medications were reviewed by me.  ROS: Unobtainable due to patient's confusional state.  Physical Examination: Blood pressure 123/85, pulse 80, temperature 98.7 F (37.1 C), temperature source Oral, resp. rate 12, height '5\' 9"'$  (1.753 m), weight 78.9 kg (174 lb), SpO2 99 %.  HEENT-  Normocephalic, no lesions, without obvious abnormality.  Normal  external eye and conjunctiva.  Normal TM's bilaterally.  Normal auditory canals and external ears. Normal external nose, mucus membranes and septum.  Normal pharynx. Neck supple with no masses, nodes, nodules or enlargement. Cardiovascular - regular rate and rhythm, S1, S2 normal, no murmur, click, rub or gallop Lungs - chest clear, no wheezing, rales, normal symmetric air entry Abdomen - soft, non-tender; bowel sounds normal; no masses,  no organomegaly Extremities - no joint deformities, effusion, or inflammation  Neurologic Examination: Mental Status: Alert, disoriented to current month and having visual hallucinations.  Speech was slightly slurred without  evidence of aphasia. Able to follow commands without difficulty. Cranial Nerves: II-Visual fields were normal. III/IV/VI-Pupils were equal and reacted. Extraocular movements were full and conjugate.    V/VII-no facial numbness and no facial weakness. VIII-normal. X-mild dysarthria; symmetrical palatal movement. XI: trapezius strength/neck flexion strength normal bilaterally XII-midline tongue extension with normal strength. Motor: 5/5 bilaterally with normal tone and bulk; patient complained of pain movement of right upper and lower extremities. Sensory: Normal throughout. Deep Tendon Reflexes: 1+ and symmetric. Plantars: Flexor bilaterally Cerebellar: Normal finger-to-nose testing.  Results for orders placed or performed during the hospital encounter of 08/07/16 (from the past 48 hour(s))  Comprehensive metabolic panel     Status: Abnormal   Collection Time: 08/07/16 10:25 PM  Result Value Ref Range   Sodium 143 135 - 145 mmol/L   Potassium  3.4 (L) 3.5 - 5.1 mmol/L   Chloride 110 101 - 111 mmol/L   CO2 22 22 - 32 mmol/L   Glucose, Bld 97 65 - 99 mg/dL   BUN 7 6 - 20 mg/dL   Creatinine, Ser 0.97 0.61 - 1.24 mg/dL   Calcium 8.3 (L) 8.9 - 10.3 mg/dL   Total Protein 6.2 (L) 6.5 - 8.1 g/dL   Albumin 4.0 3.5 - 5.0 g/dL   AST 29  15 - 41 U/L   ALT 22 17 - 63 U/L   Alkaline Phosphatase 45 38 - 126 U/L   Total Bilirubin 0.5 0.3 - 1.2 mg/dL   GFR calc non Af Amer >60 >60 mL/min   GFR calc Af Amer >60 >60 mL/min    Comment: (NOTE) The eGFR has been calculated using the CKD EPI equation. This calculation has not been validated in all clinical situations. eGFR's persistently <60 mL/min signify possible Chronic Kidney Disease.    Anion gap 11 5 - 15  Ethanol     Status: Abnormal   Collection Time: 08/07/16 10:25 PM  Result Value Ref Range   Alcohol, Ethyl (B) 319 (HH) <5 mg/dL    Comment:        LOWEST DETECTABLE LIMIT FOR SERUM ALCOHOL IS 5 mg/dL FOR MEDICAL PURPOSES ONLY CRITICAL RESULT CALLED TO, READ BACK BY AND VERIFIED WITH: OLSEN,E RN 08/07/2016 2358 JORDANS   CBC with Differential     Status: Abnormal   Collection Time: 08/07/16 10:25 PM  Result Value Ref Range   WBC 5.2 4.0 - 10.5 K/uL   RBC 3.96 (L) 4.22 - 5.81 MIL/uL   Hemoglobin 12.7 (L) 13.0 - 17.0 g/dL   HCT 37.8 (L) 39.0 - 52.0 %   MCV 95.5 78.0 - 100.0 fL   MCH 32.1 26.0 - 34.0 pg   MCHC 33.6 30.0 - 36.0 g/dL   RDW 12.7 11.5 - 15.5 %   Platelets 118 (L) 150 - 400 K/uL    Comment: PLATELET COUNT CONFIRMED BY SMEAR   Neutrophils Relative % 35 %   Neutro Abs 1.8 1.7 - 7.7 K/uL   Lymphocytes Relative 50 %   Lymphs Abs 2.6 0.7 - 4.0 K/uL   Monocytes Relative 10 %   Monocytes Absolute 0.5 0.1 - 1.0 K/uL   Eosinophils Relative 4 %   Eosinophils Absolute 0.2 0.0 - 0.7 K/uL   Basophils Relative 1 %   Basophils Absolute 0.0 0.0 - 0.1 K/uL  Valproic acid level     Status: Abnormal   Collection Time: 08/07/16 10:25 PM  Result Value Ref Range   Valproic Acid Lvl <10 (L) 50.0 - 100.0 ug/mL    Comment: RESULTS CONFIRMED BY MANUAL DILUTION  CBG monitoring, ED     Status: Abnormal   Collection Time: 08/07/16 11:13 PM  Result Value Ref Range   Glucose-Capillary 108 (H) 65 - 99 mg/dL  Urine rapid drug screen (hosp performed)     Status: Abnormal    Collection Time: 08/08/16 12:15 AM  Result Value Ref Range   Opiates NONE DETECTED NONE DETECTED   Cocaine NONE DETECTED NONE DETECTED   Benzodiazepines POSITIVE (A) NONE DETECTED   Amphetamines NONE DETECTED NONE DETECTED   Tetrahydrocannabinol POSITIVE (A) NONE DETECTED   Barbiturates NONE DETECTED NONE DETECTED    Comment:        DRUG SCREEN FOR MEDICAL PURPOSES ONLY.  IF CONFIRMATION IS NEEDED FOR ANY PURPOSE, NOTIFY LAB WITHIN 5 DAYS.  LOWEST DETECTABLE LIMITS FOR URINE DRUG SCREEN Drug Class       Cutoff (ng/mL) Amphetamine      1000 Barbiturate      200 Benzodiazepine   001 Tricyclics       749 Opiates          300 Cocaine          300 THC              50   Urinalysis, Routine w reflex microscopic     Status: None   Collection Time: 08/08/16 12:15 AM  Result Value Ref Range   Color, Urine YELLOW YELLOW   APPearance CLEAR CLEAR   Specific Gravity, Urine 1.016 1.005 - 1.030   pH 5.0 5.0 - 8.0   Glucose, UA NEGATIVE NEGATIVE mg/dL   Hgb urine dipstick NEGATIVE NEGATIVE   Bilirubin Urine NEGATIVE NEGATIVE   Ketones, ur NEGATIVE NEGATIVE mg/dL   Protein, ur NEGATIVE NEGATIVE mg/dL   Nitrite NEGATIVE NEGATIVE   Leukocytes, UA NEGATIVE NEGATIVE  Magnesium     Status: None   Collection Time: 08/08/16 12:15 AM  Result Value Ref Range   Magnesium 2.0 1.7 - 2.4 mg/dL  Basic metabolic panel     Status: Abnormal   Collection Time: 08/08/16  2:31 AM  Result Value Ref Range   Sodium 142 135 - 145 mmol/L   Potassium 4.0 3.5 - 5.1 mmol/L   Chloride 113 (H) 101 - 111 mmol/L   CO2 21 (L) 22 - 32 mmol/L   Glucose, Bld 99 65 - 99 mg/dL   BUN 6 6 - 20 mg/dL   Creatinine, Ser 0.79 0.61 - 1.24 mg/dL   Calcium 7.5 (L) 8.9 - 10.3 mg/dL   GFR calc non Af Amer >60 >60 mL/min   GFR calc Af Amer >60 >60 mL/min    Comment: (NOTE) The eGFR has been calculated using the CKD EPI equation. This calculation has not been validated in all clinical situations. eGFR's persistently  <60 mL/min signify possible Chronic Kidney Disease.    Anion gap 8 5 - 15  CBC     Status: Abnormal   Collection Time: 08/08/16  2:31 AM  Result Value Ref Range   WBC 6.5 4.0 - 10.5 K/uL   RBC 3.73 (L) 4.22 - 5.81 MIL/uL   Hemoglobin 12.1 (L) 13.0 - 17.0 g/dL   HCT 35.7 (L) 39.0 - 52.0 %   MCV 95.7 78.0 - 100.0 fL   MCH 32.4 26.0 - 34.0 pg   MCHC 33.9 30.0 - 36.0 g/dL   RDW 12.8 11.5 - 15.5 %   Platelets 112 (L) 150 - 400 K/uL    Comment: CONSISTENT WITH PREVIOUS RESULT  CK     Status: None   Collection Time: 08/08/16  3:07 AM  Result Value Ref Range   Total CK 92 49 - 397 U/L   Dg Pelvis 1-2 Views  Result Date: 08/08/2016 CLINICAL DATA:  Fall.  Right thigh pain. EXAM: PELVIS - 1-2 VIEW COMPARISON:  None. FINDINGS: The cortical margins of the bony pelvis are intact. No fracture. Pubic symphysis and sacroiliac joints are congruent. Both femoral heads are well-seated in the respective acetabula. Mild age related degenerative change of the hips. IMPRESSION: No pelvic fracture.  Minimal osteoarthritis of both hips. Electronically Signed   By: Jeb Levering M.D.   On: 08/08/2016 01:23   Ct Head Wo Contrast  Result Date: 08/08/2016 CLINICAL DATA:  Seizures, right arm/ leg pain EXAM:  CT HEAD WITHOUT CONTRAST CT CERVICAL SPINE WITHOUT CONTRAST TECHNIQUE: Multidetector CT imaging of the head and cervical spine was performed following the standard protocol without intravenous contrast. Multiplanar CT image reconstructions of the cervical spine were also generated. COMPARISON:  MRI cervical spine dated 06/02/2016. Correlation with CT head report dated 06/28/2009. FINDINGS: CT HEAD FINDINGS Brain: No evidence of acute infarction, hemorrhage, hydrocephalus, extra-axial collection or mass lesion/mass effect. Mild cortical atrophy. Vascular: No hyperdense vessel or unexpected calcification. Skull: Normal. Negative for fracture or focal lesion. Sinuses/Orbits: The visualized paranasal sinuses are  essentially clear. The mastoid air cells are unopacified. Other: None. CT CERVICAL SPINE FINDINGS Alignment: Normal cervical lordosis. Skull base and vertebrae: No acute fracture. No primary bone lesion or focal pathologic process. Soft tissues and spinal canal: No prevertebral fluid or swelling. No visible canal hematoma. Disc levels: Vertebral body heights and intervertebral disc spaces are maintained. Spinal canal is patent. Upper chest: Visualized lung apices are clear. Other: Visualized thyroid is unremarkable. IMPRESSION: No evidence of acute intracranial abnormality. Mild cortical atrophy. No evidence of traumatic injury to the cervical spine. Electronically Signed   By: Julian Hy M.D.   On: 08/08/2016 00:15   Ct Cervical Spine Wo Contrast  Result Date: 08/08/2016 CLINICAL DATA:  Seizures, right arm/ leg pain EXAM: CT HEAD WITHOUT CONTRAST CT CERVICAL SPINE WITHOUT CONTRAST TECHNIQUE: Multidetector CT imaging of the head and cervical spine was performed following the standard protocol without intravenous contrast. Multiplanar CT image reconstructions of the cervical spine were also generated. COMPARISON:  MRI cervical spine dated 06/02/2016. Correlation with CT head report dated 06/28/2009. FINDINGS: CT HEAD FINDINGS Brain: No evidence of acute infarction, hemorrhage, hydrocephalus, extra-axial collection or mass lesion/mass effect. Mild cortical atrophy. Vascular: No hyperdense vessel or unexpected calcification. Skull: Normal. Negative for fracture or focal lesion. Sinuses/Orbits: The visualized paranasal sinuses are essentially clear. The mastoid air cells are unopacified. Other: None. CT CERVICAL SPINE FINDINGS Alignment: Normal cervical lordosis. Skull base and vertebrae: No acute fracture. No primary bone lesion or focal pathologic process. Soft tissues and spinal canal: No prevertebral fluid or swelling. No visible canal hematoma. Disc levels: Vertebral body heights and intervertebral disc  spaces are maintained. Spinal canal is patent. Upper chest: Visualized lung apices are clear. Other: Visualized thyroid is unremarkable. IMPRESSION: No evidence of acute intracranial abnormality. Mild cortical atrophy. No evidence of traumatic injury to the cervical spine. Electronically Signed   By: Julian Hy M.D.   On: 08/08/2016 00:15   Dg Chest Portable 1 View  Result Date: 08/07/2016 CLINICAL DATA:  Seizures x1 day EXAM: PORTABLE CHEST 1 VIEW COMPARISON:  None. FINDINGS: The heart size and mediastinal contours are within normal limits. There is minimal atelectasis at the lung bases. The visualized skeletal structures are unremarkable. IMPRESSION: No active disease. Electronically Signed   By: Ashley Royalty M.D.   On: 08/07/2016 23:46   Dg Humerus Right  Result Date: 08/08/2016 CLINICAL DATA:  Right arm pain.  Unclear if injury. EXAM: RIGHT HUMERUS - 2+ VIEW COMPARISON:  None. FINDINGS: Cortical margins of the humerus are intact. There is no evidence of fracture or suspicious focal bone lesions. An 18 processes noted about the distal humerus, normal variant. Soft tissues are unremarkable. IMPRESSION: No acute abnormality. Electronically Signed   By: Jeb Levering M.D.   On: 08/08/2016 01:22   Dg Femur Min 2 Views Right  Result Date: 08/08/2016 CLINICAL DATA:  Right thigh pain.  Unclear if injury. EXAM: RIGHT FEMUR 2 VIEWS  COMPARISON:  None. FINDINGS: Cortical margins of the right femur are intact. There is no evidence of fracture or other focal bone lesions. Soft tissues are unremarkable. IMPRESSION: Negative radiographs of the right femur. Electronically Signed   By: Jeb Levering M.D.   On: 08/08/2016 01:24    Assessment/Plan 51 year old man with history of seizure disorder and alcohol abuse presenting with multiple witnessed generalized seizures in the setting of alcohol intoxication, likely is due to poor compliance with treatment recommendations, as well as possible early  manifestations of alcohol withdrawal.  Recommendations: 1. Resume treatment with valproic acid (Depacon) 500 mg every 8 hours 2. May discontinue Keppra 3. Thiamine and multivitamins daily 4. CIWA protocol for management of alcohol withdrawal  C.R. Nicole Kindred, MD [facility] [phone]  08/08/2016, 5:13 AM

## 2016-08-08 NOTE — ED Notes (Signed)
Pt provided with mouth swab

## 2016-08-08 NOTE — ED Notes (Signed)
Nicole Kindred, MD at bedside

## 2016-08-08 NOTE — ED Notes (Addendum)
Pt noted to have seizure activity for approximately 2 minutes. Pt noted to tense up and become rigid, some shaking noted. Pt turned onto his left side, suctioned his mouth. Small injury noted to the right side of his bottom lip. Following seizure pt immediately returned to a&o X4. Paged MD.

## 2016-08-08 NOTE — ED Notes (Signed)
When rounding on the patient he stated "I can feel another one coming on." Pt states "All I need is a cool rag." Cool rag provided, no seizure activity noted at this time. Pt resting.

## 2016-08-08 NOTE — ED Notes (Signed)
Pt to xray

## 2016-08-08 NOTE — ED Notes (Signed)
Pt reported having strong metal taste in mouth. Pt then had a one minute seizure. Pt rolled on side. Pt not postictal when seizure over.

## 2016-08-08 NOTE — ED Notes (Signed)
Pt CBG was 168, notified Jamie(RN)

## 2016-08-08 NOTE — ED Notes (Signed)
Neurologist at bedside speaking with pt.

## 2016-08-08 NOTE — ED Notes (Signed)
Pt had seizure witnessed by staff lasting approximately 30seconds, pt placed on his left side. MD present as well. Given 2mg  of Ativan per MD. No injury noted. A&O following seizure.

## 2016-08-08 NOTE — Progress Notes (Signed)
Patient was seen and examined in ER. He was admitted early morning today. Please see H&P for detail.  Briefly, 51 year old male with history of polysubstance abuse including tobacco, alcohol, marijuana, acid reflux, depression, seizure and had, noncompliance with the medical treatment, drinking alcohol everyday presented with multiple seizure episodes. Patient had myoclonic jerks 2 times in the ER. His mental status good. He is alert awake and oriented. No postictal infusion noted. Already evaluated by neurologist. Patient received a gram of Keppra loaded in the ER. Currently on IV valproate. Continue nothing by mouth. Changed IV fluid with dextrose and potassium. On Pepcid. -Continue CIWA protocol, vitamins and Ativan. -Seizure precaution, on Ativan as needed IV. I discussed with neurologist today. No need for EEG and recommended to continue current management.  Patient is awaiting stepdown bed. DVT prophylaxis with Lovenox subcutaneous  Blood pressure 127/72, heart rate 60 Lying on bed. Alert awake and oriented. Lungs clear Regular rate rhythm S1-S2 normal Abdomen soft, nontender nondistended No lower extremity edema.

## 2016-08-08 NOTE — ED Notes (Signed)
Pt attempted to contact son Christen Bame)- unsuccessful

## 2016-08-08 NOTE — ED Notes (Signed)
As RN was starting Depacon pt stated "I feel it coming on", Pt began to have a seizure.  Pt was rolled onto side, provider notified who was bedside and was given 1mg  Ativan.  Seizure lasted less than 2 minutes.  Will continue to monitor.

## 2016-08-08 NOTE — ED Notes (Signed)
Pt CBG was 81, notified Brooke(RN)

## 2016-08-08 NOTE — ED Notes (Signed)
Date and time results received: 08/07/16 11:59 PM (use smartphrase ".now" to insert current time)  Test: Ethanol Critical Value: 319  Name of Provider Notified: Regenia Skeeter MD  Orders Received? Or Actions Taken?: no new orders

## 2016-08-09 ENCOUNTER — Encounter (HOSPITAL_COMMUNITY): Payer: Self-pay

## 2016-08-09 DIAGNOSIS — R569 Unspecified convulsions: Secondary | ICD-10-CM

## 2016-08-09 LAB — GLUCOSE, CAPILLARY: Glucose-Capillary: 105 mg/dL — ABNORMAL HIGH (ref 65–99)

## 2016-08-09 MED ORDER — PNEUMOCOCCAL VAC POLYVALENT 25 MCG/0.5ML IJ INJ: 0.5000 mL | INJECTION | INTRAMUSCULAR | Status: DC

## 2016-08-09 NOTE — Progress Notes (Signed)
Subjective: No further seizures since yesterday afternoon.   He reports that he experiences a metallic taste, followed by LOC. He continues to have seizures at baseline, but has not been taking his meds. I would not expect his depakote to have been at steady state when he had his most recent, so would not make changes currently. He reports that he felt like he had a fever the night prior to admission, but nothing is documented here.   Exam: Vitals:   08/08/16 2311 08/09/16 0255  BP: 136/89 134/85  Pulse: 74 74  Resp: 14 13  Temp: 98.1 F (36.7 C) 97.9 F (36.6 C)   Gen: In bed, NAD Resp: non-labored breathing, no acute distress Abd: soft, nt  Neuro: MS: awake, alert, interactive and appropriate CN: face symmetric Motor: MAEW Sensory:reports symmetric sensation to LT  Pertinent Labs: Hypocalcemia with normal albumin. Normal magnesium.   Impression: 51 yo M with breakthrough seizures in the setting of non-compliance. I question mild infection(e.g. Cold, etc.) as a possible precipitant given reported subjective fever. In any case, with non-compliance, would not adjust meds at this time.   Recommendations: 1) Continue depakote 500mg  TID.  2) Please call with any further questions or if patient has any further seizures. Otherwise, he can follow up with outpatient neurology.   Roland Rack, MD [facility] [phone]  If 7pm- 7am, please page neurology on call as listed in [place].

## 2016-08-09 NOTE — Discharge Instructions (Signed)
    Epilepsy Epilepsy is when a person keeps having seizures. A seizure is unusual activity in the brain. A seizure can change how you think or behave, and it can make it hard to be aware of what is happening. This condition can cause problems, such as:  Falls, accidents, and injury.  Depression.  Poor memory.  Sudden unexplained death in epilepsy (SUDEP). This is rare. Its cause is not known. Most people with epilepsy lead normal lives. Follow these instructions at home: Medicines  Take medicines only as told by your doctor.  Avoid anything that may keep your medicine from working, such as alcohol. Activity  Get enough rest. Lack of sleep can make seizures more likely to occur.  Follow your doctor's advice about driving, swimming, and doing anything else that would be dangerous if you had a seizure. Teaching others  Teach friends and family what to do if you have a seizure. They should:  Lay you on the ground to prevent a fall.  Cushion your head and body.  Loosen any tight clothing around your neck.  Turn you on your side.  Stay with you until you are better.  Not hold you down.  Not put anything in your mouth.  Know whether or not you need emergency care. General instructions  Avoid anything that causes you to have seizures.  Keep a seizure diary. Write down what you remember about each seizure, and especially what might have caused it.  Keep all follow-up visits as told by your doctor. This is important. Contact a doctor if:  You have a change in your seizure pattern.  You get an infection or start to feel sick. You may have more seizures when you are sick. Get help right away if:  A seizure does not stop after 5 minutes.  You have more than one seizure in a row, and you do not have enough time between the seizures to feel better.  A seizure makes it harder to breathe.  A seizure is different from other seizures you have had.  A seizure makes you  unable to speak or use a part of your body.  You did not wake up right after a seizure. This information is not intended to replace advice given to you by your health care provider. Make sure you discuss any questions you have with your health care provider. Document Released: 01/03/2009 Document Revised: 10/13/2015 Document Reviewed: 09/16/2015 Elsevier Interactive Patient Education  2017 [employer]  

## 2016-08-09 NOTE — Progress Notes (Signed)
Discharge instructions reviewed with patient. Patient leaving with all belongings to go home in private vehicle.

## 2016-08-09 NOTE — Care Management Note (Signed)
Case Management Note  Patient Details  Name: Cody Delacruz MRN: [id number] Date of Birth: 1965-12-06  Subjective/Objective:  Presents with seizure, has hx of psa, ETOH, acid reflux, depression and seizure d/o and noncompliance.  Patient dc today, no needs.                   Action/Plan:   Expected Discharge Date:  08/09/16               Expected Discharge Plan:  Home/Self Care  In-House Referral:     Discharge planning Services  CM Consult  Post Acute Care Choice:    Choice offered to:     DME Arranged:    DME Agency:     HH Arranged:    HH Agency:     Status of Service:  Completed, signed off  If discussed at [employer] of Stay Meetings, dates discussed:    Additional Comments:  Zenon Mayo, RN 08/09/2016, 4:06 PM

## 2016-08-09 NOTE — Discharge Summary (Signed)
DISCHARGE SUMMARY  Cody Delacruz  MR#: [id number]  DOB:Jan 09, 1966  Date of Admission: 08/07/2016 Date of Discharge: 08/09/2016  Attending Physician:Kailey Esquilin T  Patient's STM:[id number], Marlena Clipper, MD  Consults:  Neurology   Disposition: D/C home   Follow-up Appts: Follow-up Information    Tresa Garter, MD Follow up in 1 week(s).   Specialty:  Internal Medicine Contact information: [place] [address] [phone]           Tests Needing Follow-up: -assure pt is compliant w/ his depakote  Discharge Diagnoses: Seizure in setting of Seizure d/o  Medication noncompliance  Alcohol abuse Tobacco abuse Depression Hyperlipidemia GERD  Initial presentation: 51 year old male with history of polysubstance abuse including tobacco alcohol and marijuana, acid reflux, depression, seizure, and noncompliance with medical treatment who presented with multiple seizure episodes. Patient had myoclonic jerks 2 times in the ER. Patient received a gram of Keppra loaded in the ER + IV valproate.   Hospital Course: The patient was admitted to the acute units after having experienced multiple seizures.  Laboratory work at the time of admission revealed an undetectable valproic acid level.  Neurology was consulted and agreed with the medical team that the most likely cause of the patient's refractory seizures was the failure to take his seizure medicine in the face of his known seizure disorder.  No other worrisome neurologic findings were appreciated on exam.  CT scan of the head and cervical spine were without acute findings.  The patient was cleared for discharge.  He was advised to discontinue alcohol use and to adhere strictly to his prescribed medication regimen.  Allergies as of 08/09/2016      Reactions   Codeine Other (See Comments)   Unknown childhood allergy      Medication List    STOP taking these medications   methocarbamol 500 MG  tablet Commonly known as:  ROBAXIN   sertraline 100 MG tablet Commonly known as:  ZOLOFT     TAKE these medications   acetaminophen-codeine 300-30 MG tablet Commonly known as:  TYLENOL #3 Take 1 tablet by mouth every 4 (four) hours as needed. What changed:  reasons to take this   divalproex 500 MG 24 hr tablet Commonly known as:  DEPAKOTE ER Take 1 tablet (500 mg total) by mouth 3 (three) times daily.   omeprazole 20 MG capsule Commonly known as:  PRILOSEC Take 2 capsules (40 mg total) by mouth daily.   pravastatin 40 MG tablet Commonly known as:  PRAVACHOL Take 1 tablet (40 mg total) by mouth daily.   traZODone 100 MG tablet Commonly known as:  DESYREL Take 1 tablet (100 mg total) by mouth at bedtime.       Day of Discharge BP (!) 136/98 (BP Location: Right Arm)   Pulse 69   Temp 98.6 F (37 C) (Oral)   Resp 10   Ht 5\' 9"  (1.753 m)   Wt 78.9 kg (174 lb)   SpO2 99%   BMI 25.70 kg/m   Physical Exam: General: No acute respiratory distress Lungs: Clear to auscultation bilaterally without wheezes or crackles Cardiovascular: Regular rate and rhythm without murmur gallop or rub normal S1 and S2 Abdomen: Nontender, nondistended, soft, bowel sounds positive, no rebound, no ascites, no appreciable mass Extremities: No significant cyanosis, clubbing, or edema bilateral lower extremities  Basic Metabolic Panel:  Recent Labs Lab 08/07/16 2225 08/08/16 0015 08/08/16 0231  NA 143  --  142  K 3.4*  --  4.0  CL 110  --  113*  CO2 22  --  21*  GLUCOSE 97  --  99  BUN 7  --  6  CREATININE 0.97  --  0.79  CALCIUM 8.3*  --  7.5*  MG  --  2.0  --     Liver Function Tests:  Recent Labs Lab 08/07/16 2225  AST 29  ALT 22  ALKPHOS 45  BILITOT 0.5  PROT 6.2*  ALBUMIN 4.0   CBC:  Recent Labs Lab 08/07/16 2225 08/08/16 0231  WBC 5.2 6.5  NEUTROABS 1.8  --   HGB 12.7* 12.1*  HCT 37.8* 35.7*  MCV 95.5 95.7  PLT 118* 112*    Cardiac Enzymes:  Recent  Labs Lab 08/08/16 0307  CKTOTAL 92    CBG:  Recent Labs Lab 08/07/16 2313 08/08/16 0813 08/08/16 1642 08/08/16 1738 08/09/16 0837  GLUCAP 108* 81 58* 168* 105*    Recent Results (from the past 240 hour(s))  MRSA PCR Screening     Status: None   Collection Time: 08/08/16  7:03 PM  Result Value Ref Range Status   MRSA by PCR NEGATIVE NEGATIVE Final    Comment:        The GeneXpert MRSA Assay (FDA approved for NASAL specimens only), is one component of a comprehensive MRSA colonization surveillance program. It is not intended to diagnose MRSA infection nor to guide or monitor treatment for MRSA infections.      Time spent in discharge (includes decision making & examination of pt): 25 minutes  08/09/2016, 2:02 PM   Cherene Altes, MD [facility] Office  [phone] Pager [phone]  On-Call/Text Page:      [email or web address]      password [email or web address]

## 2016-08-13 ENCOUNTER — Telehealth: Payer: Self-pay | Admitting: Internal Medicine

## 2016-08-13 NOTE — Telephone Encounter (Signed)
Patient called the office asking to speak with nurse regarding his surgery as well as his recent visit to the ED. Pt has an appt on 6/13. Please follow up.  Thank you.

## 2016-08-18 ENCOUNTER — Other Ambulatory Visit: Payer: Self-pay | Admitting: Internal Medicine

## 2016-08-18 DIAGNOSIS — G8929 Other chronic pain: Secondary | ICD-10-CM

## 2016-08-18 DIAGNOSIS — M5442 Lumbago with sciatica, left side: Principal | ICD-10-CM

## 2016-08-24 ENCOUNTER — Telehealth: Payer: Self-pay | Admitting: Internal Medicine

## 2016-08-24 NOTE — Telephone Encounter (Signed)
Pt. Called requesting to speak with his nurse. Pt. Did not give information regarding the call and he can be reached at [phone] room 308. Please f/u with pt.

## 2016-09-01 ENCOUNTER — Ambulatory Visit: Payer: Medicaid Other | Attending: Internal Medicine | Admitting: Internal Medicine

## 2016-09-01 ENCOUNTER — Encounter: Payer: Self-pay | Admitting: Internal Medicine

## 2016-09-01 VITALS — BP 120/85 | HR 83 | Temp 98.8°F | Resp 18 | Ht 69.0 in | Wt 178.0 lb

## 2016-09-01 DIAGNOSIS — K047 Periapical abscess without sinus: Secondary | ICD-10-CM

## 2016-09-01 DIAGNOSIS — Z885 Allergy status to narcotic agent status: Secondary | ICD-10-CM | POA: Insufficient documentation

## 2016-09-01 DIAGNOSIS — M5442 Lumbago with sciatica, left side: Secondary | ICD-10-CM | POA: Insufficient documentation

## 2016-09-01 DIAGNOSIS — F331 Major depressive disorder, recurrent, moderate: Secondary | ICD-10-CM

## 2016-09-01 DIAGNOSIS — Z8619 Personal history of other infectious and parasitic diseases: Secondary | ICD-10-CM | POA: Insufficient documentation

## 2016-09-01 DIAGNOSIS — Z79899 Other long term (current) drug therapy: Secondary | ICD-10-CM | POA: Diagnosis not present

## 2016-09-01 DIAGNOSIS — F17200 Nicotine dependence, unspecified, uncomplicated: Secondary | ICD-10-CM | POA: Diagnosis not present

## 2016-09-01 DIAGNOSIS — G8929 Other chronic pain: Secondary | ICD-10-CM | POA: Diagnosis not present

## 2016-09-01 DIAGNOSIS — M542 Cervicalgia: Secondary | ICD-10-CM | POA: Insufficient documentation

## 2016-09-01 DIAGNOSIS — R569 Unspecified convulsions: Secondary | ICD-10-CM | POA: Insufficient documentation

## 2016-09-01 DIAGNOSIS — G894 Chronic pain syndrome: Secondary | ICD-10-CM | POA: Diagnosis not present

## 2016-09-01 DIAGNOSIS — M545 Low back pain: Secondary | ICD-10-CM | POA: Diagnosis present

## 2016-09-01 MED ORDER — ACETAMINOPHEN-CODEINE #3 300-30 MG PO TABS: 1.0000 | ORAL_TABLET | Freq: Four times a day (QID) | ORAL | 0 refills | Status: DC | PRN

## 2016-09-01 MED ORDER — AMOXICILLIN-POT CLAVULANATE 875-125 MG PO TABS: 1.0000 | ORAL_TABLET | Freq: Two times a day (BID) | ORAL | 0 refills | Status: AC

## 2016-09-01 MED ORDER — DULOXETINE HCL 30 MG PO CPEP: 30.0000 mg | ORAL_CAPSULE | Freq: Every day | ORAL | 3 refills | Status: DC

## 2016-09-01 MED ORDER — NICOTINE 21 MG/24HR TD PT24: 21.0000 mg | MEDICATED_PATCH | Freq: Every day | TRANSDERMAL | 3 refills | Status: DC

## 2016-09-01 NOTE — Progress Notes (Signed)
Cody Delacruz, is a 51 y.o. male  [id number]  [id number]  DOB - 12/13/1965  Chief Complaint  Patient presents with  . Hospitalization Follow-up      Subjective:   Cody Delacruz is a 51 y.o. male with history of chronic low back pain, chronic neck pain, dyslipidemia, seizure disorder and major depression here today for a follow up visit and dental referral. Patient continues to have significant pain, generalized, but mostly localized to his lower back. He describes significant adjustment disorder with depression as a result of his medical conditions and co-morbidities. Patient has No headache, No chest pain, No abdominal pain - No Nausea, No new weakness tingling or numbness, No Cough - SOB.  Problem  Dental Abscess  Moderate Episode of Recurrent Major Depressive Disorder (Hcc)  Smoking Addiction    ALLERGIES: Allergies  Allergen Reactions  . Codeine Other (See Comments)    Unknown childhood allergy   PAST MEDICAL HISTORY: Past Medical History:  Diagnosis Date  . Back pain   . Depression   . Neck pain   . Other acariasis    Hep C  . Seizures ([place])     MEDICATIONS AT HOME: Prior to Admission medications   Medication Sig Start Date End Date Taking? Authorizing Provider  acetaminophen-codeine (TYLENOL #3) 300-30 MG tablet Take 1 tablet by mouth every 6 (six) hours as needed for moderate pain. 09/01/16  Yes Tresa Garter, MD  divalproex (DEPAKOTE ER) 500 MG 24 hr tablet Take 1 tablet (500 mg total) by mouth 3 (three) times daily. 05/19/16  Yes Tresa Garter, MD  omeprazole (PRILOSEC) 20 MG capsule Take 2 capsules (40 mg total) by mouth daily. 01/22/16  Yes Tresa Garter, MD  pravastatin (PRAVACHOL) 40 MG tablet Take 1 tablet (40 mg total) by mouth daily. 05/19/16  Yes Tresa Garter, MD  traZODone (DESYREL) 100 MG tablet Take 1 tablet (100 mg total) by mouth at bedtime. 05/19/16  Yes Tresa Garter, MD  amoxicillin-clavulanate  (AUGMENTIN) 875-125 MG tablet Take 1 tablet by mouth 2 (two) times daily. 09/01/16 09/06/16  Tresa Garter, MD  DULoxetine (CYMBALTA) 30 MG capsule Take 1 capsule (30 mg total) by mouth daily. 09/01/16   Tresa Garter, MD  nicotine (NICODERM CQ - DOSED IN MG/24 HOURS) 21 mg/24hr patch Place 1 patch (21 mg total) onto the skin daily. 09/19/16   Tresa Garter, MD    Objective:   Vitals:   09/01/16 0907  BP: 120/85  Pulse: 83  Resp: 18  Temp: 98.8 F (37.1 C)  TempSrc: Oral  SpO2: 99%  Weight: 178 lb (80.7 kg)  Height: 5\' 9"  (1.753 m)   Exam General appearance : Awake, alert, not in any distress. Speech Clear. Not toxic looking HEENT: Atraumatic and Normocephalic, pupils equally reactive to light and accomodation Neck: Supple, no JVD. No cervical lymphadenopathy.  Chest: Good air entry bilaterally, no added sounds  CVS: S1 S2 regular, no murmurs.  Abdomen: Bowel sounds present, Non tender and not distended with no gaurding, rigidity or rebound. Extremities: B/L Lower Ext shows no edema, both legs are warm to touch Neurology: Awake alert, and oriented X 3, CN II-XII intact, Non focal Skin: No Rash  Data Review No results found for: HGBA1C  Assessment & Plan   1. Dental abscess  - amoxicillin-clavulanate (AUGMENTIN) 875-125 MG tablet; Take 1 tablet by mouth 2 (two) times daily.  Dispense: 10 tablet; Refill: 0 - Ambulatory referral to Dentistry  2.  Chronic bilateral low back pain with left-sided sciatica  - acetaminophen-codeine (TYLENOL #3) 300-30 MG tablet; Take 1 tablet by mouth every 6 (six) hours as needed for moderate pain.  Dispense: 60 tablet; Refill: 0  3. Moderate episode of recurrent major depressive disorder (HCC)  - DULoxetine (CYMBALTA) 30 MG capsule; Take 1 capsule (30 mg total) by mouth daily.  Dispense: 30 capsule; Refill: 3  4. Smoking addiction  - nicotine (NICODERM CQ - DOSED IN MG/24 HOURS) 21 mg/24hr patch; Place 1 patch (21 mg total)  onto the skin daily.  Dispense: 28 patch; Refill: 3  Patient have been counseled extensively about nutrition and exercise. Other issues discussed during this visit include: low cholesterol diet, weight control and daily exercise.   Return in about 6 months (around 03/03/2017) for Follow up Pain and comorbidities, Follow up HTN.  The patient was given clear instructions to go to ER or return to medical center if symptoms don't improve, worsen or new problems develop. The patient verbalized understanding. The patient was told to call to get lab results if they haven't heard anything in the next week.   This note has been created with Surveyor, quantity. Any transcriptional errors are unintentional.    Angelica Chessman, MD, [place], Karilyn Cota, [place] [facility] [place], [place]   09/01/2016, 10:32 AM

## 2016-09-01 NOTE — Progress Notes (Signed)
Patient is here for HFU:SZ  Patient has taken medication today. Patient has eaten today.  Patient complains of lump under right arm. Pain is intermittent. Pain occurs when laying on the left side.

## 2016-09-01 NOTE — Patient Instructions (Addendum)
Steps to Quit Smoking Smoking tobacco can be harmful to your health and can affect almost every organ in your body. Smoking puts you, and those around you, at risk for developing many serious chronic diseases. Quitting smoking is difficult, but it is one of the best things that you can do for your health. It is never too late to quit. What are the benefits of quitting smoking? When you quit smoking, you lower your risk of developing serious diseases and conditions, such as:  Lung cancer or lung disease, such as COPD.  Heart disease.  Stroke.  Heart attack.  Infertility.  Osteoporosis and bone fractures.  Additionally, symptoms such as coughing, wheezing, and shortness of breath may get better when you quit. You may also find that you get sick less often because your body is stronger at fighting off colds and infections. If you are pregnant, quitting smoking can help to reduce your chances of having a baby of low birth weight. How do I get ready to quit? When you decide to quit smoking, create a plan to make sure that you are successful. Before you quit:  Pick a date to quit. Set a date within the next two weeks to give you time to prepare.  Write down the reasons why you are quitting. Keep this list in places where you will see it often, such as on your bathroom mirror or in your car or wallet.  Identify the people, places, things, and activities that make you want to smoke (triggers) and avoid them. Make sure to take these actions: ? Throw away all cigarettes at home, at work, and in your car. ? Throw away smoking accessories, such as ashtrays and lighters. ? Clean your car and make sure to empty the ashtray. ? Clean your home, including curtains and carpets.  Tell your family, friends, and coworkers that you are quitting. Support from your loved ones can make quitting easier.  Talk with your health care provider about your options for quitting smoking.  Find out what treatment  options are covered by your health insurance.  What strategies can I use to quit smoking? Talk with your healthcare provider about different strategies to quit smoking. Some strategies include:  Quitting smoking altogether instead of gradually lessening how much you smoke over a period of time. Research shows that quitting "cold turkey" is more successful than gradually quitting.  Attending in-person counseling to help you build problem-solving skills. You are more likely to have success in quitting if you attend several counseling sessions. Even short sessions of 10 minutes can be effective.  Finding resources and support systems that can help you to quit smoking and remain smoke-free after you quit. These resources are most helpful when you use them often. They can include: ? Online chats with a counselor. ? Telephone quitlines. ? Printed self-help materials. ? Support groups or group counseling. ? Text messaging programs. ? Mobile phone applications.  Taking medicines to help you quit smoking. (If you are pregnant or breastfeeding, talk with your health care provider first.) Some medicines contain nicotine and some do not. Both types of medicines help with cravings, but the medicines that include nicotine help to relieve withdrawal symptoms. Your health care provider may recommend: ? Nicotine patches, gum, or lozenges. ? Nicotine inhalers or sprays. ? Non-nicotine medicine that is taken by mouth.  Talk with your health care provider about combining strategies, such as taking medicines while you are also receiving in-person counseling. Using these two strategies together   makes you more likely to succeed in quitting than if you used either strategy on its own. If you are pregnant or breastfeeding, talk with your health care provider about finding counseling or other support strategies to quit smoking. Do not take medicine to help you quit smoking unless told to do so by your health care  provider. What things can I do to make it easier to quit? Quitting smoking might feel overwhelming at first, but there is a lot that you can do to make it easier. Take these important actions:  Reach out to your family and friends and ask that they support and encourage you during this time. Call telephone quitlines, reach out to support groups, or work with a counselor for support.  Ask people who smoke to avoid smoking around you.  Avoid places that trigger you to smoke, such as bars, parties, or smoke-break areas at work.  Spend time around people who do not smoke.  Lessen stress in your life, because stress can be a smoking trigger for some people. To lessen stress, try: ? Exercising regularly. ? Deep-breathing exercises. ? Yoga. ? Meditating. ? Performing a body scan. This involves closing your eyes, scanning your body from head to toe, and noticing which parts of your body are particularly tense. Purposefully relax the muscles in those areas.  Download or purchase mobile phone or tablet apps (applications) that can help you stick to your quit plan by providing reminders, tips, and encouragement. There are many free apps, such as QuitGuide from the [employer] Office manager for Disease Control and Prevention). You can find other support for quitting smoking (smoking cessation) through smokefree.gov and other websites.  How will I feel when I quit smoking? Within the first 24 hours of quitting smoking, you may start to feel some withdrawal symptoms. These symptoms are usually most noticeable 2-3 days after quitting, but they usually do not last beyond 2-3 weeks. Changes or symptoms that you might experience include:  Mood swings.  Restlessness, anxiety, or irritation.  Difficulty concentrating.  Dizziness.  Strong cravings for sugary foods in addition to nicotine.  Mild weight gain.  Constipation.  Nausea.  Coughing or a sore throat.  Changes in how your medicines work in your  body.  A depressed mood.  Difficulty sleeping (insomnia).  After the first 2-3 weeks of quitting, you may start to notice more positive results, such as:  Improved sense of smell and taste.  Decreased coughing and sore throat.  Slower heart rate.  Lower blood pressure.  Clearer skin.  The ability to breathe more easily.  Fewer sick days.  Quitting smoking is very challenging for most people. Do not get discouraged if you are not successful the first time. Some people need to make many attempts to quit before they achieve long-term success. Do your best to stick to your quit plan, and talk with your health care provider if you have any questions or concerns. This information is not intended to replace advice given to you by your health care provider. Make sure you discuss any questions you have with your health care provider. Document Released: 03/02/2001 Document Revised: 11/04/2015 Document Reviewed: 07/23/2014 Elsevier Interactive Patient Education  2017 [place]. Chronic Back Pain When back pain lasts longer than 3 months, it is called chronic back pain.The cause of your back pain may not be known. Some common causes include:  Wear and tear (degenerative disease) of the bones, ligaments, or disks in your back.  Inflammation and stiffness in  your back (arthritis).  People who have chronic back pain often go through certain periods in which the pain is more intense (flare-ups). Many people can learn to manage the pain with home care. Follow these instructions at home: Pay attention to any changes in your symptoms. Take these actions to help with your pain: Activity  Avoid bending and activities that make the problem worse.  Do not sit or stand in one place for long periods of time.  Take brief periods of rest throughout the day. This will reduce your pain. Resting in a lying or standing position is usually better than sitting to rest.  When you are resting for  longer periods, mix in some mild activity or stretching between periods of rest. This will help to prevent stiffness and pain.  Get regular exercise. Ask your health care provider what activities are safe for you.  Do not lift anything that is heavier than 10 lb (4.5 kg). Always use proper lifting technique, which includes: ? Bending your knees. ? Keeping the load close to your body. ? Avoiding twisting. Managing pain  If directed, apply ice to the painful area. Your health care provider may recommend applying ice during the first 24-48 hours after a flare-up begins. ? Put ice in a plastic bag. ? Place a towel between your skin and the bag. ? Leave the ice on for 20 minutes, 2-3 times per day.  After icing, apply heat to the affected area as often as told by your health care provider. Use the heat source that your health care provider recommends, such as a moist heat pack or a heating pad. ? Place a towel between your skin and the heat source. ? Leave the heat on for 20-30 minutes. ? Remove the heat if your skin turns bright red. This is especially important if you are unable to feel pain, heat, or cold. You may have a greater risk of getting burned.  Try soaking in a warm tub.  Take over-the-counter and prescription medicines only as told by your health care provider.  Keep all follow-up visits as told by your health care provider. This is important. Contact a health care provider if:  You have pain that is not relieved with rest or medicine. Get help right away if:  You have weakness or numbness in one or both of your legs or feet.  You have trouble controlling your bladder or your bowels.  You have nausea or vomiting.  You have pain in your abdomen.  You have shortness of breath or you faint. This information is not intended to replace advice given to you by your health care provider. Make sure you discuss any questions you have with your health care provider. Document  Released: 04/15/2004 Document Revised: 07/17/2015 Document Reviewed: 08/26/2014 Elsevier Interactive Patient Education  2018 [place] Abscess A dental abscess is a collection of pus in or around a tooth. What are the causes? This condition is caused by a bacterial infection around the root of the tooth that involves the inner part of the tooth (pulp). It may result from:  Severe tooth decay.  Trauma to the tooth that allows bacteria to enter into the pulp, such as a broken or chipped tooth.  Severe gum disease around a tooth.  What are the signs or symptoms? Symptoms of this condition include:  Severe pain in and around the infected tooth.  Swelling and redness around the infected tooth, in the mouth, or in the face.  Tenderness.  Pus drainage.  Bad breath.  Bitter taste in the mouth.  Difficulty swallowing.  Difficulty opening the mouth.  Nausea.  Vomiting.  Chills.  Swollen neck glands.  Fever.  How is this diagnosed? This condition is diagnosed with examination of the infected tooth. During the exam, your dentist may tap on the infected tooth. Your dentist will also ask about your medical and dental history and may order X-rays. How is this treated? This condition is treated by eliminating the infection. This may be done with:  Antibiotic medicine.  A root canal. This may be performed to save the tooth.  Pulling (extracting) the tooth. This may also involve draining the abscess. This is done if the tooth cannot be saved.  Follow these instructions at home:  Take medicines only as directed by your dentist.  If you were prescribed antibiotic medicine, finish all of it even if you start to feel better.  Rinse your mouth (gargle) often with salt water to relieve pain or swelling.  Do not drive or operate heavy machinery while taking pain medicine.  Do not apply heat to the outside of your mouth.  Keep all follow-up visits as directed by  your dentist. This is important. Contact a health care provider if:  Your pain is worse and is not helped by medicine. Get help right away if:  You have a fever or chills.  Your symptoms suddenly get worse.  You have a very bad headache.  You have problems breathing or swallowing.  You have trouble opening your mouth.  You have swelling in your neck or around your eye. This information is not intended to replace advice given to you by your health care provider. Make sure you discuss any questions you have with your health care provider. Document Released: 03/08/2005 Document Revised: 07/17/2015 Document Reviewed: 03/05/2014 Elsevier Interactive Patient Education  2017 [employer].

## 2016-09-15 ENCOUNTER — Other Ambulatory Visit: Payer: Self-pay | Admitting: Internal Medicine

## 2016-09-15 ENCOUNTER — Telehealth: Payer: Self-pay | Admitting: Internal Medicine

## 2016-09-15 NOTE — Telephone Encounter (Signed)
PT called to request a refill for omeprazole (PRILOSEC) 20 MG capsule  And  acetaminophen-codeine (TYLENOL #3) 300-30 MG tablet   Please call pt when is ready the med, please follow up

## 2016-09-16 ENCOUNTER — Other Ambulatory Visit: Payer: Self-pay | Admitting: Internal Medicine

## 2016-09-16 DIAGNOSIS — G8929 Other chronic pain: Secondary | ICD-10-CM

## 2016-09-16 DIAGNOSIS — M5442 Lumbago with sciatica, left side: Principal | ICD-10-CM

## 2016-09-16 MED ORDER — ACETAMINOPHEN-CODEINE #3 300-30 MG PO TABS: 1.0000 | ORAL_TABLET | Freq: Four times a day (QID) | ORAL | 0 refills | Status: DC | PRN

## 2016-09-16 NOTE — Telephone Encounter (Signed)
Refilled

## 2016-09-16 NOTE — Telephone Encounter (Signed)
I called PT to inform that his med Tylenol # 3 is ready for pick up

## 2016-10-06 ENCOUNTER — Other Ambulatory Visit: Payer: Self-pay | Admitting: Internal Medicine

## 2016-10-06 ENCOUNTER — Telehealth: Payer: Self-pay | Admitting: Internal Medicine

## 2016-10-06 DIAGNOSIS — M5442 Lumbago with sciatica, left side: Principal | ICD-10-CM

## 2016-10-06 DIAGNOSIS — G8929 Other chronic pain: Secondary | ICD-10-CM

## 2016-10-06 NOTE — Telephone Encounter (Signed)
Patient requesting refill for tylenol 3 and cymbalta.Marland KitchenMarland Kitchen

## 2016-10-08 ENCOUNTER — Other Ambulatory Visit: Payer: Self-pay | Admitting: Internal Medicine

## 2016-10-11 ENCOUNTER — Telehealth: Payer: Self-pay | Admitting: Internal Medicine

## 2016-10-11 NOTE — Telephone Encounter (Signed)
Pt called to request a refill for  acetaminophen-codeine (TYLENOL #3) 300-30 MG tablet  DULoxetine (CYMBALTA) 30 MG capsule  Please follow up

## 2016-10-11 NOTE — Telephone Encounter (Signed)
Pt called states he needs referral to [place] [facility], pt tried to call but needs new referral in system. Please f/up

## 2016-10-11 NOTE — Telephone Encounter (Signed)
Patients information has been placed with his script In the book

## 2016-10-19 ENCOUNTER — Other Ambulatory Visit: Payer: Self-pay | Admitting: Internal Medicine

## 2016-10-26 ENCOUNTER — Telehealth: Payer: Self-pay | Admitting: Internal Medicine

## 2016-10-26 DIAGNOSIS — M5442 Lumbago with sciatica, left side: Principal | ICD-10-CM

## 2016-10-26 DIAGNOSIS — G8929 Other chronic pain: Secondary | ICD-10-CM

## 2016-10-26 NOTE — Telephone Encounter (Signed)
Referral to neurosurgery resent due to last referral expiring. Patient will be called with an appointment once paperwork has been faxed over for referral.

## 2016-10-26 NOTE — Telephone Encounter (Signed)
Pt called to let the PCP know that the Neuro Surgical still waiting for the referral to be able to schedule a consultation for surgery for back and neck, Pt has Medicaid insurance, please follow up

## 2016-10-26 NOTE — Telephone Encounter (Signed)
Pt called to request a refill for °acetaminophen-codeine (TYLENOL #3) 300-30 MG tablet °Please follow up °

## 2016-10-27 NOTE — Telephone Encounter (Signed)
Last refill by PCP was on 10/06/16. Not due for refill until 11/06/16

## 2016-11-05 ENCOUNTER — Other Ambulatory Visit: Payer: Self-pay | Admitting: *Deleted

## 2016-11-05 ENCOUNTER — Telehealth: Payer: Self-pay

## 2016-11-05 DIAGNOSIS — G8929 Other chronic pain: Secondary | ICD-10-CM

## 2016-11-05 DIAGNOSIS — M5442 Lumbago with sciatica, left side: Principal | ICD-10-CM

## 2016-11-05 MED ORDER — ACETAMINOPHEN-CODEINE #3 300-30 MG PO TABS: ORAL_TABLET | ORAL | 0 refills | Status: DC

## 2016-11-05 NOTE — Telephone Encounter (Signed)
Pt is requesting Tylenol #3 please f/u

## 2016-11-05 NOTE — Telephone Encounter (Signed)
Prescription is printed and awaiting the signature

## 2016-11-05 NOTE — Telephone Encounter (Signed)
Pt called to check the statu of the refill, he was inform that is not been athu. Yet and he claim that he will call his pcp and he will get the prescription today for him,

## 2016-11-05 NOTE — Telephone Encounter (Signed)
Last filled on 10/05/16

## 2016-11-10 ENCOUNTER — Telehealth: Payer: Self-pay | Admitting: Internal Medicine

## 2016-11-10 NOTE — Telephone Encounter (Signed)
error 

## 2016-11-18 ENCOUNTER — Observation Stay (HOSPITAL_COMMUNITY)
Admission: EM | Admit: 2016-11-18 | Discharge: 2016-11-19 | Disposition: A | Discharge status: Home / Self Care | Payer: Medicaid Other | Attending: Internal Medicine | Admitting: Internal Medicine

## 2016-11-18 ENCOUNTER — Encounter (HOSPITAL_COMMUNITY): Payer: Self-pay | Admitting: Emergency Medicine

## 2016-11-18 ENCOUNTER — Emergency Department (HOSPITAL_COMMUNITY): Payer: Medicaid Other

## 2016-11-18 DIAGNOSIS — M5442 Lumbago with sciatica, left side: Secondary | ICD-10-CM | POA: Diagnosis not present

## 2016-11-18 DIAGNOSIS — E785 Hyperlipidemia, unspecified: Secondary | ICD-10-CM | POA: Diagnosis not present

## 2016-11-18 DIAGNOSIS — M5441 Lumbago with sciatica, right side: Secondary | ICD-10-CM | POA: Diagnosis not present

## 2016-11-18 DIAGNOSIS — G894 Chronic pain syndrome: Secondary | ICD-10-CM | POA: Diagnosis present

## 2016-11-18 DIAGNOSIS — Z79899 Other long term (current) drug therapy: Secondary | ICD-10-CM | POA: Diagnosis not present

## 2016-11-18 DIAGNOSIS — G8929 Other chronic pain: Secondary | ICD-10-CM

## 2016-11-18 DIAGNOSIS — K625 Hemorrhage of anus and rectum: Secondary | ICD-10-CM | POA: Diagnosis present

## 2016-11-18 DIAGNOSIS — K219 Gastro-esophageal reflux disease without esophagitis: Secondary | ICD-10-CM | POA: Diagnosis present

## 2016-11-18 DIAGNOSIS — F17200 Nicotine dependence, unspecified, uncomplicated: Secondary | ICD-10-CM | POA: Diagnosis not present

## 2016-11-18 DIAGNOSIS — Z720 Tobacco use: Secondary | ICD-10-CM | POA: Diagnosis not present

## 2016-11-18 DIAGNOSIS — G40909 Epilepsy, unspecified, not intractable, without status epilepticus: Secondary | ICD-10-CM | POA: Diagnosis not present

## 2016-11-18 DIAGNOSIS — F1010 Alcohol abuse, uncomplicated: Secondary | ICD-10-CM | POA: Diagnosis not present

## 2016-11-18 DIAGNOSIS — R1030 Lower abdominal pain, unspecified: Secondary | ICD-10-CM | POA: Diagnosis not present

## 2016-11-18 LAB — CBC
HEMATOCRIT: 42.7 % (ref 39.0–52.0)
Hemoglobin: 15.1 g/dL (ref 13.0–17.0)
MCH: 34.7 pg — ABNORMAL HIGH (ref 26.0–34.0)
MCHC: 35.4 g/dL (ref 30.0–36.0)
MCV: 98.2 fL (ref 78.0–100.0)
PLATELETS: 156 10*3/uL (ref 150–400)
RBC: 4.35 MIL/uL (ref 4.22–5.81)
RDW: 13.1 % (ref 11.5–15.5)
WBC: 6.7 10*3/uL (ref 4.0–10.5)

## 2016-11-18 LAB — COMPREHENSIVE METABOLIC PANEL
ALBUMIN: 4.8 g/dL (ref 3.5–5.0)
ALK PHOS: 55 U/L (ref 38–126)
ALT: 30 U/L (ref 17–63)
AST: 34 U/L (ref 15–41)
Anion gap: 11 (ref 5–15)
BILIRUBIN TOTAL: 0.3 mg/dL (ref 0.3–1.2)
BUN: 8 mg/dL (ref 6–20)
CALCIUM: 9 mg/dL (ref 8.9–10.3)
CO2: 23 mmol/L (ref 22–32)
CREATININE: 0.72 mg/dL (ref 0.61–1.24)
Chloride: 104 mmol/L (ref 101–111)
GFR calc Af Amer: >60 mL/min (ref >60)
GLUCOSE: 135 mg/dL — AB (ref 65–99)
POTASSIUM: 3.5 mmol/L (ref 3.5–5.1)
Sodium: 138 mmol/L (ref 135–145)
TOTAL PROTEIN: 7.9 g/dL (ref 6.5–8.1)

## 2016-11-18 LAB — PROTIME-INR
INR: 1.04
PROTHROMBIN TIME: 13.5 s (ref 11.4–15.2)

## 2016-11-18 LAB — ABO/RH: ABO/RH(D): O POS

## 2016-11-18 LAB — TYPE AND SCREEN
ABO/RH(D): O POS
ANTIBODY SCREEN: NEGATIVE

## 2016-11-18 LAB — POC OCCULT BLOOD, ED: Fecal Occult Bld: POSITIVE — AB

## 2016-11-18 MED ORDER — THIAMINE HCL 100 MG/ML IJ SOLN: 100.0000 mg | Freq: Every day | INTRAMUSCULAR | Status: DC

## 2016-11-18 MED ORDER — HYDROCORTISONE ACETATE 25 MG RE SUPP
25.0000 mg | Freq: Two times a day (BID) | RECTAL | Status: DC
  Administered 2016-11-18 – 2016-11-19 (×2): 25 mg via RECTAL
  Filled 2016-11-18 (×2): qty 1

## 2016-11-18 MED ORDER — IOPAMIDOL (ISOVUE-300) INJECTION 61%
100.0000 mL | Freq: Once | INTRAVENOUS | Status: AC | PRN
Start: 2016-11-18 — End: 2016-11-18
  Administered 2016-11-18: 100 mL via INTRAVENOUS

## 2016-11-18 MED ORDER — IOPAMIDOL (ISOVUE-300) INJECTION 61%
INTRAVENOUS | Status: AC
  Filled 2016-11-18: qty 100

## 2016-11-18 MED ORDER — LORAZEPAM 2 MG/ML IJ SOLN: 1.0000 mg | Freq: Four times a day (QID) | INTRAMUSCULAR | Status: DC | PRN

## 2016-11-18 MED ORDER — DIVALPROEX SODIUM ER 500 MG PO TB24
500.0000 mg | ORAL_TABLET | Freq: Three times a day (TID) | ORAL | Status: DC
  Administered 2016-11-18 – 2016-11-19 (×2): 500 mg via ORAL
  Filled 2016-11-18 (×2): qty 1

## 2016-11-18 MED ORDER — VITAMIN B-1 100 MG PO TABS
100.0000 mg | ORAL_TABLET | Freq: Every day | ORAL | Status: DC
  Administered 2016-11-19: 100 mg via ORAL
  Filled 2016-11-18: qty 1

## 2016-11-18 MED ORDER — DULOXETINE HCL 30 MG PO CPEP
30.0000 mg | ORAL_CAPSULE | Freq: Every day | ORAL | Status: DC
  Administered 2016-11-19: 30 mg via ORAL
  Filled 2016-11-18: qty 1

## 2016-11-18 MED ORDER — ADULT MULTIVITAMIN W/MINERALS CH
1.0000 | ORAL_TABLET | Freq: Every day | ORAL | Status: DC
  Administered 2016-11-19: 1 via ORAL
  Filled 2016-11-18: qty 1

## 2016-11-18 MED ORDER — PANTOPRAZOLE SODIUM 40 MG PO TBEC
40.0000 mg | DELAYED_RELEASE_TABLET | Freq: Every day | ORAL | Status: DC
  Administered 2016-11-19: 40 mg via ORAL
  Filled 2016-11-18: qty 1

## 2016-11-18 MED ORDER — PRAVASTATIN SODIUM 20 MG PO TABS
40.0000 mg | ORAL_TABLET | Freq: Every day | ORAL | Status: DC
Start: 2016-11-19 — End: 2016-11-19
  Administered 2016-11-19: 40 mg via ORAL
  Filled 2016-11-18: qty 2

## 2016-11-18 MED ORDER — NICOTINE 21 MG/24HR TD PT24
21.0000 mg | MEDICATED_PATCH | Freq: Every day | TRANSDERMAL | Status: DC
  Administered 2016-11-19: 21 mg via TRANSDERMAL
  Filled 2016-11-18: qty 1

## 2016-11-18 MED ORDER — TRAZODONE HCL 50 MG PO TABS
100.0000 mg | ORAL_TABLET | Freq: Every day | ORAL | Status: DC
  Administered 2016-11-18: 100 mg via ORAL
  Filled 2016-11-18: qty 2

## 2016-11-18 MED ORDER — LORAZEPAM 1 MG PO TABS: 1.0000 mg | ORAL_TABLET | Freq: Four times a day (QID) | ORAL | Status: DC | PRN

## 2016-11-18 MED ORDER — FOLIC ACID 1 MG PO TABS
1.0000 mg | ORAL_TABLET | Freq: Every day | ORAL | Status: DC
  Administered 2016-11-19: 1 mg via ORAL
  Filled 2016-11-18: qty 1

## 2016-11-18 NOTE — ED Triage Notes (Addendum)
Pt reports having 2 colonoscopy's in the past due to rectal bleeding that were unremarkable.  Endorses weakness.  Sees Dr. Renard Hamper GI in [place]. Blood noted to back on patient's pants.

## 2016-11-18 NOTE — H&P (Signed)
History and Physical    Cody Delacruz VVO:[id number] DOB: Jul 11, 1965 DOA: 11/18/2016  I have briefly reviewed the patient's prior medical records in [place]  PCP: Tresa Garter, MD  Patient coming from: Home  Chief Complaint: Rectal bleed  HPI: Cody Delacruz is a 51 y.o. male with medical history significant of seizure disorder, tobacco and alcohol abuse, hyperlipidemia, presents to the emergency room with chief complaint of rectal bleed.  Patient tells me that he has been having rectal bleed almost every day for the past 2 years, however today he has noticed that his bleed is significantly worsened.  He states that he has had 2 colonoscopies in the past, (for screening purposes), and several polyps were removed but otherwise unremarkable.  Most recent colonoscopy was in December 2017 by his GI doctor at [facility].  He is also noticed that his bleed is usually when he has a bowel movement, and he also noticed red streaks on the toilet paper when he wipes.  Today, he had an episode of bleeding, however today when compared to previous days his bleeding did not stop right away and feel like blood was continuing to come out, and he soiled his clothes.  Currently his bleeding is stopped.  He denies any abdominal pain, nausea vomiting or diarrhea.  He has no chest pain, shortness of breath, weakness and is otherwise feeling at baseline.  ED Course: In the ED rectal exam by EDP showed traces of bright red blood.  His blood work is remarkable for hemoglobin of 15.  His vital signs are stable, he was slightly tachycardic of 101 when he first came in and repeat his pulse has been in the 60s-70s.  TRH was asked to admit for observation given concern of worsening of his chronic bleed.  Review of Systems: As per HPI otherwise 10 point review of systems negative.   Past Medical History:  Diagnosis Date  . Back pain   . Depression   . Neck pain   . Other acariasis    Hep C  . Seizures  ([place])     Past Surgical History:  Procedure Laterality Date  . HERNIA REPAIR    . JOINT REPLACEMENT       reports that he has been smoking.  He has a 10.00 pack-year smoking history. He has never used smokeless tobacco. He reports that he drinks about 8.4 oz of alcohol per week . He reports that he uses drugs, including Marijuana.  Allergies  Allergen Reactions  . Codeine Other (See Comments)    Unknown childhood allergy    Family History  Problem Relation Age of Onset  . Cancer Mother   . Cancer Father   . Cancer Sister   . Cancer Brother   . Cancer Maternal Grandmother     Prior to Admission medications   Medication Sig Start Date End Date Taking? Authorizing Provider  acetaminophen-codeine (TYLENOL #3) 300-30 MG tablet TAKE 1 TABLET BY MOUTH EVERY 6 HOURS AS NEEDED FOR MODERATE PAIN 11/05/16  Yes Tresa Garter, MD  divalproex (DEPAKOTE ER) 500 MG 24 hr tablet Take 1 tablet (500 mg total) by mouth 3 (three) times daily. 05/19/16  Yes Tresa Garter, MD  DULoxetine (CYMBALTA) 30 MG capsule Take 1 capsule (30 mg total) by mouth daily. 09/01/16  Yes Jegede, Olugbemiga E, MD  nicotine (NICODERM CQ - DOSED IN MG/24 HOURS) 21 mg/24hr patch Place 1 patch (21 mg total) onto the skin daily. 09/19/16  Yes  Tresa Garter, MD  omeprazole (PRILOSEC) 20 MG capsule TAKE 2 CAPSULES BY MOUTH DAILY 09/15/16  Yes Jegede, Olugbemiga E, MD  pravastatin (PRAVACHOL) 40 MG tablet Take 1 tablet (40 mg total) by mouth daily. 05/19/16  Yes Tresa Garter, MD  traZODone (DESYREL) 100 MG tablet Take 1 tablet (100 mg total) by mouth at bedtime. 05/19/16  Yes Tresa Garter, MD    Physical Exam: Vitals:   11/18/16 1255 11/18/16 1418 11/18/16 1500 11/18/16 1600  BP: (!) 128/95 (!) 135/92 119/84 (!) 136/94  Pulse: (!) 101 90 72 85  Resp: 12     Temp: 98.5 F (36.9 C)     TempSrc: Oral     SpO2: 95% 97% 94% 99%  Weight: 81 kg (178 lb 8 oz)     Height: 5\' 9"  (1.753 m)         Constitutional: NAD, calm, comfortable Eyes: PERRL, lids and conjunctivae normal ENMT: Mucous membranes are moist. .  Neck: normal, supple Respiratory: clear to auscultation bilaterally, no wheezing, no crackles. Normal respiratory effort. No accessory muscle use.  Cardiovascular: Regular rate and rhythm, no murmurs / rubs / gallops. No extremity edema. 2+ pedal pulses.  Abdomen: no tenderness, no masses palpated. Bowel sounds positive.  Musculoskeletal: no clubbing / cyanosis. Normal muscle tone.  Skin: no rashes, lesions, ulcers. No induration Neurologic: CN 2-12 grossly intact. Strength 5/5 in all 4.  Psychiatric: Normal judgment and insight. Alert and oriented x 3. Normal mood.   Labs on Admission: I have personally reviewed following labs and imaging studies  CBC:  Recent Labs Lab 11/18/16 1310  WBC 6.7  HGB 15.1  HCT 42.7  MCV 98.2  PLT 025   Basic Metabolic Panel:  Recent Labs Lab 11/18/16 1310  NA 138  K 3.5  CL 104  CO2 23  GLUCOSE 135*  BUN 8  CREATININE 0.72  CALCIUM 9.0   GFR: Estimated Creatinine Clearance: 106.8 mL/min (by C-G formula based on SCr of 0.72 mg/dL). Liver Function Tests:  Recent Labs Lab 11/18/16 1310  AST 34  ALT 30  ALKPHOS 55  BILITOT 0.3  PROT 7.9  ALBUMIN 4.8   No results for input(s): LIPASE, AMYLASE in the last 168 hours. No results for input(s): AMMONIA in the last 168 hours. Coagulation Profile:  Recent Labs Lab 11/18/16 1550  INR 1.04   Cardiac Enzymes: No results for input(s): CKTOTAL, CKMB, CKMBINDEX, TROPONINI in the last 168 hours. BNP (last 3 results) No results for input(s): PROBNP in the last 8760 hours. HbA1C: No results for input(s): HGBA1C in the last 72 hours. CBG: No results for input(s): GLUCAP in the last 168 hours. Lipid Profile: No results for input(s): CHOL, HDL, LDLCALC, TRIG, CHOLHDL, LDLDIRECT in the last 72 hours. Thyroid Function Tests: No results for input(s): TSH, T4TOTAL,  FREET4, T3FREE, THYROIDAB in the last 72 hours. Anemia Panel: No results for input(s): VITAMINB12, FOLATE, FERRITIN, TIBC, IRON, RETICCTPCT in the last 72 hours. Urine analysis:    Component Value Date/Time   COLORURINE YELLOW 08/08/2016 0015   APPEARANCEUR CLEAR 08/08/2016 0015   LABSPEC 1.016 08/08/2016 0015   PHURINE 5.0 08/08/2016 0015   GLUCOSEU NEGATIVE 08/08/2016 0015   HGBUR NEGATIVE 08/08/2016 0015   BILIRUBINUR NEGATIVE 08/08/2016 0015   KETONESUR NEGATIVE 08/08/2016 0015   PROTEINUR NEGATIVE 08/08/2016 0015   NITRITE NEGATIVE 08/08/2016 0015   LEUKOCYTESUR NEGATIVE 08/08/2016 0015     Radiological Exams on Admission: Ct Abdomen Pelvis W Contrast  Result  Date: 11/18/2016 CLINICAL DATA:  Abdominal pain and rectal bleeding. EXAM: CT ABDOMEN AND PELVIS WITH CONTRAST TECHNIQUE: Multidetector CT imaging of the abdomen and pelvis was performed using the standard protocol following bolus administration of intravenous contrast. CONTRAST:  120mL ISOVUE-300 IOPAMIDOL (ISOVUE-300) INJECTION 61% COMPARISON:  08/08/2016 pelvic radiograph. FINDINGS: Lower chest: No significant pulmonary nodules or acute consolidative airspace disease. Coronary atherosclerosis. Hepatobiliary: Normal liver size. No liver mass. Normal gallbladder with no radiopaque cholelithiasis. No biliary ductal dilatation. Pancreas: Normal, with no mass or duct dilation. Spleen: Normal size. No mass. Adrenals/Urinary Tract: Normal adrenals. Normal kidneys with no hydronephrosis and no renal mass. Normal bladder. Stomach/Bowel: Grossly normal stomach. Normal caliber small bowel with no small bowel wall thickening. Normal appendix. Normal large bowel with no diverticulosis, large bowel wall thickening or pericolonic fat stranding. Vascular/Lymphatic: Atherosclerotic nonaneurysmal abdominal aorta. Patent portal, splenic, hepatic and renal veins. No pathologically enlarged lymph nodes in the abdomen or pelvis. Reproductive: Normal  size prostate. Other: No pneumoperitoneum, ascites or focal fluid collection. Musculoskeletal: No aggressive appearing focal osseous lesions. Mild thoracolumbar spondylosis. IMPRESSION: 1. No acute abnormality. No evidence of bowel obstruction or acute bowel inflammation. Normal appendix. 2.  Aortic Atherosclerosis (ICD10-I70.0).  Coronary atherosclerosis. Electronically Signed   By: Ilona Sorrel M.D.   On: 11/18/2016 15:49    Assessment/Plan Active Problems:   GERD (gastroesophageal reflux disease)   Seizure disorder (HCC)   Bilateral low back pain with left-sided sciatica   Dyslipidemia   Chronic pain syndrome   Tobacco abuse   Alcohol abuse   Rectal bleed   Rectal bleed likely due to hemorrhoids -Today slightly worse, however patient reports chronic bleeding every morning with his bowel movements.  Suspect this is benign hemorrhoidal bleed, however we will admit for observation overnight and check CBC in the morning.  He he has no further bleed and his CBC is stable, is probably okay for him to see his GI doctor as an outpatient. -Will place on Anusol HC suppositories  Seizure disorder -Continue home Depakote  Alcohol use -Patient drinks 2 x 24 ounce cans of beer per day -Place patient on CIWA  Tobacco abuse -Nicotine patch  GERD -Continue home PPI  Hyperlipidemia -Continue statin  Depression -Continue Cymbalta, nightly trazodone  Chronic back pain -Patient is wheelchair-bound currently   DVT prophylaxis: SCDs Code Status: Full code Family Communication: No family at bedside Disposition Plan: Admit to [facility], expect home when ready Consults called: None    Admission status: Observation  At the point of initial evaluation, it is my clinical opinion that admission for OBSERVATION is reasonable and necessary because the patient's presenting complaints in the context of their chronic conditions represent sufficient risk of deterioration or significant morbidity to  constitute reasonable grounds for close observation in the hospital setting, but that the patient may be medically stable for discharge from the hospital within 24 to 48 hours.     Marzetta Board, MD [facility] Pager [phone]  If 7PM-7AM, please contact night-coverage [email or web address] Password [email or web address]  11/18/2016, 5:06 PM

## 2016-11-18 NOTE — ED Provider Notes (Signed)
[facility] DEPT Provider Note   CSN: [id number] Arrival date & time: 11/18/16  1249     History   Chief Complaint Chief Complaint  Patient presents with  . Rectal Bleeding    HPI Cody Delacruz is a 51 y.o. male who presents to the ED for evaluation of gradually worsening rectal bleeding Over the past week. He reports that initially he would only notice a small amount of blood on the toilet paper when he wiped, however it has gradually increased and today he was reportedly dripping blood from his rectum down his leg.  He reports that it saturated his jeans. He has a history of rectal bleeding and has had 2 [facility]op[facility], both of which did not appear to reveal any cause. He denies any recent rectal trauma.  He reports that he does drink a large amount of alcohol, drinking at least 40 ounces of beer a day for the past 40 years.  He endorses nausea, and occasional vomiting however this is not changed from his normal. His vomit does not appear to contain coffee ground like material.  He does not have a history of esophageal varices. He did have hepatitis C however reports that he took treatment and it was cured.  He denies any history of liver problems.   HPI  Past Medical History:  Diagnosis Date  . Back pain   . Depression   . Neck pain   . Other acariasis    Hep C  . Seizures ([place])     Patient Active Problem List   Diagnosis Date Noted  . Dental abscess 09/01/2016  . Moderate episode of recurrent major depressive disorder ([place]) 09/01/2016  . Smoking addiction 09/01/2016  . Depression 08/08/2016  . Tobacco abuse 08/08/2016  . Alcohol abuse 08/08/2016  . Seizure ([place]) 08/08/2016  . Alcoholic intoxication with complication ([place])   . Numbness and tingling in both hands 01/22/2016  . Chronic pain syndrome 01/22/2016  . Assistance needed for mobility 01/22/2016  . Healthcare maintenance 03/10/2015  . Dyslipidemia 08/26/2014  . Bilateral low back pain with left-sided  sciatica 02/21/2014  . GERD (gastroesophageal reflux disease) 05/01/2013  . Seizure disorder ([place]) 05/01/2013  . Neck pain, chronic 10/31/2012  . Chronic bilateral low back pain with left-sided sciatica 10/31/2012    Past Surgical History:  Procedure Laterality Date  . HERNIA REPAIR    . JOINT REPLACEMENT         Home Medications    Prior to Admission medications   Medication Sig Start Date End Date Taking? Authorizing Provider  acetaminophen-codeine (TYLENOL #3) 300-30 MG tablet TAKE 1 TABLET BY MOUTH EVERY 6 HOURS AS NEEDED FOR MODERATE PAIN 11/05/16  Yes Tresa Garter, MD  divalproex (DEPAKOTE ER) 500 MG 24 hr tablet Take 1 tablet (500 mg total) by mouth 3 (three) times daily. 05/19/16  Yes Tresa Garter, MD  DULoxetine (CYMBALTA) 30 MG capsule Take 1 capsule (30 mg total) by mouth daily. 09/01/16  Yes Jegede, Olugbemiga E, MD  nicotine (NICODERM CQ - DOSED IN MG/24 HOURS) 21 mg/24hr patch Place 1 patch (21 mg total) onto the skin daily. 09/19/16  Yes Tresa Garter, MD  omeprazole (PRILOSEC) 20 MG capsule TAKE 2 CAPSULES BY MOUTH DAILY 09/15/16  Yes Jegede, Olugbemiga E, MD  pravastatin (PRAVACHOL) 40 MG tablet Take 1 tablet (40 mg total) by mouth daily. 05/19/16  Yes Tresa Garter, MD  traZODone (DESYREL) 100 MG tablet Take 1 tablet (100 mg total) by  mouth at bedtime. 05/19/16  Yes Tresa Garter, MD    Family History Family History  Problem Relation Age of Onset  . Cancer Mother   . Cancer Father   . Cancer Sister   . Cancer Brother   . Cancer Maternal Grandmother     Social History Social History  Substance Use Topics  . Smoking status: Current Every Day Smoker    Packs/day: 0.25    Years: 40.00  . Smokeless tobacco: Never Used     Comment: smoking 5 cigs/day  . Alcohol use 8.4 oz/week    14 Cans of beer per week     Comment: pt states "2 24oz daily when I can"     Allergies   Codeine   Review of Systems Review of Systems    Constitutional: Negative for chills and fever.  HENT: Negative for ear pain and sore throat.   Eyes: Negative for pain and visual disturbance.  Respiratory: Negative for cough and shortness of breath.   Cardiovascular: Negative for chest pain and palpitations.  Gastrointestinal: Positive for abdominal pain, blood in stool and diarrhea. Negative for constipation, nausea, rectal pain and vomiting.  Genitourinary: Negative for dysuria, flank pain, hematuria, penile pain and testicular pain.  Musculoskeletal: Negative for arthralgias and back pain.  Skin: Negative for color change and rash.  Neurological: Negative for seizures and syncope.  All other systems reviewed and are negative.    Physical Exam Updated Vital Signs BP (!) 136/94   Pulse 85   Temp 98.5 F (36.9 C) (Oral)   Resp 12   Ht 5\' 9"  (1.753 m)   Wt 81 kg (178 lb 8 oz)   SpO2 99%   BMI 26.36 kg/m   Physical Exam  Constitutional: He appears well-developed and well-nourished.  HENT:  Head: Normocephalic and atraumatic.  Eyes: Conjunctivae are normal. Right eye exhibits no discharge. Left eye exhibits no discharge. No scleral icterus.  Neck: Normal range of motion.  Cardiovascular: Normal rate and regular rhythm.   Pulmonary/Chest: Effort normal and breath sounds normal. No stridor. No respiratory distress. He has no wheezes. He has no rales.  Abdominal: Normal appearance and bowel sounds are normal. He exhibits no distension. There is tenderness in the right lower quadrant and left lower quadrant. There is no rigidity, no rebound and no guarding.  Genitourinary: Rectal exam shows guaiac positive stool (Positive). Rectal exam shows no external hemorrhoid, no internal hemorrhoid, no tenderness and anal tone normal. Prostate is not enlarged and not tender.  Genitourinary Comments: Skin tags consistent with old external hemorrhoids present, scant bright red blood present around rectum.   Musculoskeletal: He exhibits no edema  or deformity.  Neurological: He is alert. He exhibits normal muscle tone.  Skin: Skin is warm and dry. He is not diaphoretic.  Psychiatric: He has a normal mood and affect. His behavior is normal.  Nursing note and vitals reviewed.    ED Treatments / Results  Labs (all labs ordered are listed, but only abnormal results are displayed) Labs Reviewed  COMPREHENSIVE METABOLIC PANEL - Abnormal; Notable for the following:       Result Value   Glucose, Bld 135 (*)    All other components within normal limits  CBC - Abnormal; Notable for the following:    MCH 34.7 (*)    All other components within normal limits  POC OCCULT BLOOD, ED - Abnormal; Notable for the following:    Fecal Occult Bld POSITIVE (*)  All other components within normal limits  PROTIME-INR  TYPE AND SCREEN  ABO/RH    EKG  EKG Interpretation None       Radiology Ct Abdomen Pelvis W Contrast  Result Date: 11/18/2016 CLINICAL DATA:  Abdominal pain and rectal bleeding. EXAM: CT ABDOMEN AND PELVIS WITH CONTRAST TECHNIQUE: Multidetector CT imaging of the abdomen and pelvis was performed using the standard protocol following bolus administration of intravenous contrast. CONTRAST:  159mL ISOVUE-300 IOPAMIDOL (ISOVUE-300) INJECTION 61% COMPARISON:  08/08/2016 pelvic radiograph. FINDINGS: Lower chest: No significant pulmonary nodules or acute consolidative airspace disease. Coronary atherosclerosis. Hepatobiliary: Normal liver size. No liver mass. Normal gallbladder with no radiopaque cholelithiasis. No biliary ductal dilatation. Pancreas: Normal, with no mass or duct dilation. Spleen: Normal size. No mass. Adrenals/Urinary Tract: Normal adrenals. Normal kidneys with no hydronephrosis and no renal mass. Normal bladder. Stomach/Bowel: Grossly normal stomach. Normal caliber small bowel with no small bowel wall thickening. Normal appendix. Normal large bowel with no diverticulosis, large bowel wall thickening or pericolonic fat  stranding. Vascular/Lymphatic: Atherosclerotic nonaneurysmal abdominal aorta. Patent portal, splenic, hepatic and renal veins. No pathologically enlarged lymph nodes in the abdomen or pelvis. Reproductive: Normal size prostate. Other: No pneumoperitoneum, ascites or focal fluid collection. Musculoskeletal: No aggressive appearing focal osseous lesions. Mild thoracolumbar spondylosis. IMPRESSION: 1. No acute abnormality. No evidence of bowel obstruction or acute bowel inflammation. Normal appendix. 2.  Aortic Atherosclerosis (ICD10-I70.0).  Coronary atherosclerosis. Electronically Signed   By: Ilona Sorrel M.D.   On: 11/18/2016 15:49    Procedures Procedures (including critical care time)  Medications Ordered in ED Medications  iopamidol (ISOVUE-300) 61 % injection (not administered)  iopamidol (ISOVUE-300) 61 % injection 100 mL (100 mLs Intravenous Contrast Given 11/18/16 1512)     Initial Impression / Assessment and Plan / ED Course  I have reviewed the triage vital signs and the nursing notes.  Pertinent labs & imaging results that were available during my care of the patient were reviewed by me and considered in my medical decision making (see chart for details).  Clinical Course as of Nov 18 1637  Thu Nov 18, 2016  1637 Spoke with hospitalist who will come see patient.   [EH]    Clinical Course User Index [EH] Lorin Glass, PA-C   Cody Delacruz presents for evaluation of gradually worsening bright red per rectum that today was running down his leg and saturating his pants.  He has a history of similar and has had two colonoscopies at [facility] for this with out cause for his symptoms found.  Patient is hemodynamically stable, not hypotensive, or tachycardic. Normal hemoglobin.Patient states he does not feel comfortable going home like this, and based on his volume of rectal bleeding I feel that an observation admission is reasonable to monitor hemoglobin. Hospitalists were consulted  who stated that they would come and see the patient.  Final Clinical Impressions(s) / ED Diagnoses   Final diagnoses:  Rectal bleeding    New Prescriptions New Prescriptions   No medications on file     Ollen Gross 11/19/16 1704    Gareth Morgan, MD 11/23/16 0040

## 2016-11-18 NOTE — ED Notes (Signed)
ED TO INPATIENT HANDOFF REPORT  Name/Age/Gender Linville Decarolis 51 y.o. male  Code Status Code Status History    Date Active Date Inactive Code Status Order ID Comments User Context   08/08/2016  2:24 AM 08/09/2016  5:57 PM Full Code [id number]  Ivor Costa, MD ED      Home/SNF/Other Home  Chief Complaint Rectal Bleeding  Level of Care/Admitting Diagnosis ED Disposition    ED Disposition Condition [facility] Area: [facility] [[id number]]  Level of Care: Med-Surg [16]  Diagnosis: Rectal bleed [060156]  Admitting Physician: Caren Griffins [[id number]]  Attending Physician: Caren Griffins [[id number]]  PT Class (Do Not Modify): Observation [104]  PT Acc Code (Do Not Modify): Observation [10022]       Medical History Past Medical History:  Diagnosis Date  . Back pain   . Depression   . Neck pain   . Other acariasis    Hep C  . Seizures (HCC)     Allergies Allergies  Allergen Reactions  . Codeine Other (See Comments)    Unknown childhood allergy    IV Location/Drains/Wounds Patient Lines/Drains/Airways Status   Active Line/Drains/Airways    Name:   Placement date:   Placement time:   Site:   Days:   Peripheral IV 11/18/16 Right Antecubital  11/18/16    1458    Antecubital    less than 1          Labs/Imaging Results for orders placed or performed during the hospital encounter of 11/18/16 (from the past 48 hour(s))  Type and screen [place]     Status: None   Collection Time: 11/18/16  1:09 PM  Result Value Ref Range   ABO/RH(D) O POS    Antibody Screen NEG    Sample Expiration 11/21/2016   ABO/Rh     Status: None   Collection Time: 11/18/16  1:09 PM  Result Value Ref Range   ABO/RH(D) O POS   Comprehensive metabolic panel     Status: Abnormal   Collection Time: 11/18/16  1:10 PM  Result Value Ref Range   Sodium 138 135 - 145 mmol/L   Potassium 3.5 3.5 - 5.1 mmol/L   Chloride 104 101 - 111 mmol/L   CO2  23 22 - 32 mmol/L   Glucose, Bld 135 (H) 65 - 99 mg/dL   BUN 8 6 - 20 mg/dL   Creatinine, Ser 0.72 0.61 - 1.24 mg/dL   Calcium 9.0 8.9 - 10.3 mg/dL   Total Protein 7.9 6.5 - 8.1 g/dL   Albumin 4.8 3.5 - 5.0 g/dL   AST 34 15 - 41 U/L   ALT 30 17 - 63 U/L   Alkaline Phosphatase 55 38 - 126 U/L   Total Bilirubin 0.3 0.3 - 1.2 mg/dL   GFR calc non Af Amer >60 >60 mL/min   GFR calc Af Amer >60 >60 mL/min    Comment: (NOTE) The eGFR has been calculated using the CKD EPI equation. This calculation has not been validated in all clinical situations. eGFR's persistently <60 mL/min signify possible Chronic Kidney Disease.    Anion gap 11 5 - 15  CBC     Status: Abnormal   Collection Time: 11/18/16  1:10 PM  Result Value Ref Range   WBC 6.7 4.0 - 10.5 K/uL   RBC 4.35 4.22 - 5.81 MIL/uL   Hemoglobin 15.1 13.0 - 17.0 g/dL   HCT 42.7 39.0 - 52.0 %  MCV 98.2 78.0 - 100.0 fL   MCH 34.7 (H) 26.0 - 34.0 pg   MCHC 35.4 30.0 - 36.0 g/dL   RDW 13.1 11.5 - 15.5 %   Platelets 156 150 - 400 K/uL  POC occult blood, ED     Status: Abnormal   Collection Time: 11/18/16  2:37 PM  Result Value Ref Range   Fecal Occult Bld POSITIVE (A) NEGATIVE  Protime-INR     Status: None   Collection Time: 11/18/16  3:50 PM  Result Value Ref Range   Prothrombin Time 13.5 11.4 - 15.2 seconds   INR 1.04    Ct Abdomen Pelvis W Contrast  Result Date: 11/18/2016 CLINICAL DATA:  Abdominal pain and rectal bleeding. EXAM: CT ABDOMEN AND PELVIS WITH CONTRAST TECHNIQUE: Multidetector CT imaging of the abdomen and pelvis was performed using the standard protocol following bolus administration of intravenous contrast. CONTRAST:  129m ISOVUE-300 IOPAMIDOL (ISOVUE-300) INJECTION 61% COMPARISON:  08/08/2016 pelvic radiograph. FINDINGS: Lower chest: No significant pulmonary nodules or acute consolidative airspace disease. Coronary atherosclerosis. Hepatobiliary: Normal liver size. No liver mass. Normal gallbladder with no  radiopaque cholelithiasis. No biliary ductal dilatation. Pancreas: Normal, with no mass or duct dilation. Spleen: Normal size. No mass. Adrenals/Urinary Tract: Normal adrenals. Normal kidneys with no hydronephrosis and no renal mass. Normal bladder. Stomach/Bowel: Grossly normal stomach. Normal caliber small bowel with no small bowel wall thickening. Normal appendix. Normal large bowel with no diverticulosis, large bowel wall thickening or pericolonic fat stranding. Vascular/Lymphatic: Atherosclerotic nonaneurysmal abdominal aorta. Patent portal, splenic, hepatic and renal veins. No pathologically enlarged lymph nodes in the abdomen or pelvis. Reproductive: Normal size prostate. Other: No pneumoperitoneum, ascites or focal fluid collection. Musculoskeletal: No aggressive appearing focal osseous lesions. Mild thoracolumbar spondylosis. IMPRESSION: 1. No acute abnormality. No evidence of bowel obstruction or acute bowel inflammation. Normal appendix. 2.  Aortic Atherosclerosis (ICD10-I70.0).  Coronary atherosclerosis. Electronically Signed   By: JIlona SorrelM.D.   On: 11/18/2016 15:49    Pending Labs [employer]   Start     Ordered   Signed and Held  CBC  Tomorrow morning,   R     Signed and Held   Signed and Held  Basic metabolic panel  Tomorrow morning,   R     Signed and Held      Vitals/Pain Today's Vitals   11/18/16 1500 11/18/16 1600 11/18/16 1700 11/18/16 1701  BP: 119/84 (!) 136/94 (!) 149/99   Pulse: 72 85  72  Resp:      Temp:      TempSrc:      SpO2: 94% 99%  93%  Weight:      Height:        Isolation Precautions No active isolations  Medications Medications  iopamidol (ISOVUE-300) 61 % injection (not administered)  iopamidol (ISOVUE-300) 61 % injection 100 mL (100 mLs Intravenous Contrast Given 11/18/16 1512)    Mobility walks with device

## 2016-11-18 NOTE — ED Notes (Signed)
Transporter called to take patient to 3W.

## 2016-11-18 NOTE — ED Triage Notes (Signed)
Patient brought in by Cody Delacruz fro rectal bleeding x 3 days. Patient reports that when he was walking through apartment he had blood dripping.  Patient drank 2- 24 oz beers earlier.

## 2016-11-18 NOTE — Telephone Encounter (Signed)
Pt. Called stating that he is having rectal bleeding and it's just pouring. Pt. Was advised to call 911 and pt. Stated he would.

## 2016-11-19 DIAGNOSIS — K625 Hemorrhage of anus and rectum: Secondary | ICD-10-CM | POA: Diagnosis not present

## 2016-11-19 LAB — BASIC METABOLIC PANEL
Anion gap: 8 (ref 5–15)
BUN: 9 mg/dL (ref 6–20)
CALCIUM: 8.7 mg/dL — AB (ref 8.9–10.3)
CO2: 25 mmol/L (ref 22–32)
CREATININE: 0.71 mg/dL (ref 0.61–1.24)
Chloride: 107 mmol/L (ref 101–111)
GFR calc Af Amer: >60 mL/min (ref >60)
GLUCOSE: 110 mg/dL — AB (ref 65–99)
Potassium: 4.1 mmol/L (ref 3.5–5.1)
SODIUM: 140 mmol/L (ref 135–145)

## 2016-11-19 LAB — CBC
HCT: 39.4 % (ref 39.0–52.0)
Hemoglobin: 13.8 g/dL (ref 13.0–17.0)
MCH: 34.4 pg — AB (ref 26.0–34.0)
MCHC: 35 g/dL (ref 30.0–36.0)
MCV: 98.3 fL (ref 78.0–100.0)
PLATELETS: 138 10*3/uL — AB (ref 150–400)
RBC: 4.01 MIL/uL — AB (ref 4.22–5.81)
RDW: 12.9 % (ref 11.5–15.5)
WBC: 4.7 10*3/uL (ref 4.0–10.5)

## 2016-11-19 MED ORDER — HYDROCORTISONE ACETATE 25 MG RE SUPP: 25.0000 mg | Freq: Two times a day (BID) | RECTAL | 0 refills | Status: DC

## 2016-11-19 NOTE — Discharge Summary (Signed)
Physician Discharge Summary  Cody Delacruz EXB:[id number] DOB: [birth year] DOA: 11/18/2016  PCP: Tresa Garter, MD  Admit date: 11/18/2016 Discharge date: 11/19/2016  Admitted From: Home Disposition:  Home  Recommendations for Outpatient Follow-up:  1. Follow up with PCP in 1 week 2. Follow up with Dr. Steward Drone, GI at [facility] in 2-4 weeks 3. Please obtain CBC in 1 week   Discharge Condition: Stable CODE STATUS: Full  Diet recommendation: Heart healthy   Brief/Interim Summary by Dr. Cruzita Lederer H&P:  HPI: Cody Delacruz is a 51 y.o. male with medical history significant of seizure disorder, tobacco and alcohol abuse, hyperlipidemia, presents to the emergency room with chief complaint of rectal bleed.  Patient tells me that he has been having rectal bleed almost every day for the past 2 years, however today he has noticed that his bleed is significantly worsened.  He states that he has had 2 colonoscopies in the past, (for screening purposes), and several polyps were removed but otherwise unremarkable.  Most recent colonoscopy was in December 2017 by his GI doctor at [facility].  He is also noticed that his bleed is usually when he has a bowel movement, and he also noticed red streaks on the toilet paper when he wipes.  Today, he had an episode of bleeding, however today when compared to previous days his bleeding did not stop right away and feel like blood was continuing to come out, and he soiled his clothes.  Currently his bleeding is stopped.  He denies any abdominal pain, nausea vomiting or diarrhea.  He has no chest pain, shortness of breath, weakness and is otherwise feeling at baseline.  ED Course: In the ED rectal exam by EDP showed traces of bright red blood.  His blood work is remarkable for hemoglobin of 15.  His vital signs are stable, he was slightly tachycardic of 101 when he first came in and repeat his pulse has been in the 60s-70s.  TRH was asked to admit for observation  given concern of worsening of his chronic bleed.  Interim: Patient's hemoglobin was observed, was stable. His rectal bleeding stopped. He had a bowel movement on the morning of discharge with a small amount of bleeding which is his baseline. No complaints of lightheadedness or dizziness, chest pain or shortness of breath, nausea, vomiting, abdominal pain. He is to follow-up with his primary care physician as well as his GI physician at [facility].  Discharge Diagnoses:  Active Problems:   GERD (gastroesophageal reflux disease)   Seizure disorder (HCC)   Bilateral low back pain with left-sided sciatica   Dyslipidemia   Chronic pain syndrome   Tobacco abuse   Alcohol abuse   Rectal bleed   Rectal bleed likely due to hemorrhoids -Bleeding now stopped. Hgb stable -Small amount of bleed with BM this morning; stopped spontaneously. This is his baseline for past 2 years.  -Will place on Anusol HC suppositories  Seizure disorder -Continue home Depakote  Alcohol use -Patient drinks 2 x 24 ounce cans of beer per day -Encourage cessation   Tobacco abuse -Nicotine patch and encourage cessation   GERD -Continue home PPI  Hyperlipidemia -Continue statin  Depression -Continue Cymbalta, nightly trazodone  Chronic back pain -Patient is wheelchair-bound currently    Discharge Instructions  Discharge Instructions    Call MD for:    Complete by:  As directed    Uncontrolled bleeding   Call MD for:  difficulty breathing, headache or visual disturbances  Complete by:  As directed    Call MD for:  extreme fatigue    Complete by:  As directed    Call MD for:  hives    Complete by:  As directed    Call MD for:  persistant dizziness or light-headedness    Complete by:  As directed    Call MD for:  persistant nausea and vomiting    Complete by:  As directed    Call MD for:  severe uncontrolled pain    Complete by:  As directed    Call MD for:  temperature >100.4     Complete by:  As directed    Diet - low sodium heart healthy    Complete by:  As directed    Discharge instructions    Complete by:  As directed    You were cared for by a hospitalist during your hospital stay. If you have any questions about your discharge medications or the care you received while you were in the hospital after you are discharged, you can call the unit and asked to speak with the hospitalist on call if the hospitalist that took care of you is not available. Once you are discharged, your primary care physician will handle any further medical issues. Please note that NO REFILLS for any discharge medications will be authorized once you are discharged, as it is imperative that you return to your primary care physician (or establish a relationship with a primary care physician if you do not have one) for your aftercare needs so that they can reassess your need for medications and monitor your lab values.   Increase activity slowly    Complete by:  As directed      Allergies as of 11/19/2016      Reactions   Codeine Other (See Comments)   Unknown childhood allergy      Medication List    TAKE these medications   acetaminophen-codeine 300-30 MG tablet Commonly known as:  TYLENOL #3 TAKE 1 TABLET BY MOUTH EVERY 6 HOURS AS NEEDED FOR MODERATE PAIN   divalproex 500 MG 24 hr tablet Commonly known as:  DEPAKOTE ER Take 1 tablet (500 mg total) by mouth 3 (three) times daily.   DULoxetine 30 MG capsule Commonly known as:  CYMBALTA Take 1 capsule (30 mg total) by mouth daily.   hydrocortisone 25 MG suppository Commonly known as:  ANUSOL-HC Place 1 suppository (25 mg total) rectally 2 (two) times daily.   nicotine 21 mg/24hr patch Commonly known as:  NICODERM CQ - dosed in mg/24 hours Place 1 patch (21 mg total) onto the skin daily.   omeprazole 20 MG capsule Commonly known as:  PRILOSEC TAKE 2 CAPSULES BY MOUTH DAILY   pravastatin 40 MG tablet Commonly known as:   PRAVACHOL Take 1 tablet (40 mg total) by mouth daily.   traZODone 100 MG tablet Commonly known as:  DESYREL Take 1 tablet (100 mg total) by mouth at bedtime.            Discharge Care Instructions        Start     Ordered   11/19/16 0000  hydrocortisone (ANUSOL-HC) 25 MG suppository  2 times daily     11/19/16 1316   11/19/16 0000  Increase activity slowly     11/19/16 1316   11/19/16 0000  Diet - low sodium heart healthy     11/19/16 1316   11/19/16 0000  Discharge instructions    Comments:  You were cared for by a hospitalist during your hospital stay. If you have any questions about your discharge medications or the care you received while you were in the hospital after you are discharged, you can call the unit and asked to speak with the hospitalist on call if the hospitalist that took care of you is not available. Once you are discharged, your primary care physician will handle any further medical issues. Please note that NO REFILLS for any discharge medications will be authorized once you are discharged, as it is imperative that you return to your primary care physician (or establish a relationship with a primary care physician if you do not have one) for your aftercare needs so that they can reassess your need for medications and monitor your lab values.   11/19/16 1316   11/19/16 0000  Call MD for:  extreme fatigue     11/19/16 1316   11/19/16 0000  Call MD for:  persistant dizziness or light-headedness     11/19/16 1316   11/19/16 0000  Call MD for:  hives     11/19/16 1316   11/19/16 0000  Call MD for:  difficulty breathing, headache or visual disturbances     11/19/16 1316   11/19/16 0000  Call MD for:  persistant nausea and vomiting     11/19/16 1316   11/19/16 0000  Call MD for:  temperature >100.4     11/19/16 1316   11/19/16 0000  Call MD for:    Comments:  Uncontrolled bleeding   11/19/16 1316   11/19/16 0000  Call MD for:  severe uncontrolled pain      11/19/16 1316     Follow-up Information    Tresa Garter, MD. Schedule an appointment as soon as possible for a visit in 1 week(s).   Specialty:  Internal Medicine Contact information: [place] [address] [phone]        Elroy Channel, MD. Schedule an appointment as soon as possible for a visit in 2 week(s).   Specialty:  Internal Medicine Contact information: [address] [place] [address] [phone]          Allergies  Allergen Reactions  . Codeine Other (See Comments)    Unknown childhood allergy    Consultations:  None   Procedures/Studies: Ct Abdomen Pelvis W Contrast  Result Date: 11/18/2016 CLINICAL DATA:  Abdominal pain and rectal bleeding. EXAM: CT ABDOMEN AND PELVIS WITH CONTRAST TECHNIQUE: Multidetector CT imaging of the abdomen and pelvis was performed using the standard protocol following bolus administration of intravenous contrast. CONTRAST:  1105mL ISOVUE-300 IOPAMIDOL (ISOVUE-300) INJECTION 61% COMPARISON:  08/08/2016 pelvic radiograph. FINDINGS: Lower chest: No significant pulmonary nodules or acute consolidative airspace disease. Coronary atherosclerosis. Hepatobiliary: Normal liver size. No liver mass. Normal gallbladder with no radiopaque cholelithiasis. No biliary ductal dilatation. Pancreas: Normal, with no mass or duct dilation. Spleen: Normal size. No mass. Adrenals/Urinary Tract: Normal adrenals. Normal kidneys with no hydronephrosis and no renal mass. Normal bladder. Stomach/Bowel: Grossly normal stomach. Normal caliber small bowel with no small bowel wall thickening. Normal appendix. Normal large bowel with no diverticulosis, large bowel wall thickening or pericolonic fat stranding. Vascular/Lymphatic: Atherosclerotic nonaneurysmal abdominal aorta. Patent portal, splenic, hepatic and renal veins. No pathologically enlarged lymph nodes in the abdomen or pelvis. Reproductive: Normal size  prostate. Other: No pneumoperitoneum, ascites or focal fluid collection. Musculoskeletal: No aggressive appearing focal osseous lesions. Mild thoracolumbar spondylosis. IMPRESSION: 1. No acute  abnormality. No evidence of bowel obstruction or acute bowel inflammation. Normal appendix. 2.  Aortic Atherosclerosis (ICD10-I70.0).  Coronary atherosclerosis. Electronically Signed   By: Ilona Sorrel M.D.   On: 11/18/2016 15:49      Discharge Exam: Vitals:   11/18/16 2159 11/19/16 0520  BP: 101/70 127/83  Pulse: 79 83  Resp: 20 20  Temp: 98.7 F (37.1 C) 98.6 F (37 C)  SpO2: 98% 98%   Vitals:   11/18/16 1830 11/18/16 1850 11/18/16 2159 11/19/16 0520  BP: (!) 140/98 (!) 150/104 101/70 127/83  Pulse: 79 73 79 83  Resp:  20 20 20   Temp:  98.2 F (36.8 C) 98.7 F (37.1 C) 98.6 F (37 C)  TempSrc:  Oral Oral Oral  SpO2: 98% 98% 98% 98%  Weight:  81.4 kg (179 lb 8 oz)    Height:  5\' 9"  (1.753 m)      General: Pt is alert, awake, not in acute distress Cardiovascular: RRR, S1/S2 +, no rubs, no gallops Respiratory: CTA bilaterally, no wheezing, no rhonchi Abdominal: Soft, NT, ND, bowel sounds + Extremities: no edema, no cyanosis    The results of significant diagnostics from this hospitalization (including imaging, microbiology, ancillary and laboratory) are listed below for reference.     Microbiology: No results found for this or any previous visit (from the past 240 hour(s)).   Labs: BNP (last 3 results) No results for input(s): BNP in the last 8760 hours. Basic Metabolic Panel:  Recent Labs Lab 11/18/16 1310 11/19/16 0345  NA 138 140  K 3.5 4.1  CL 104 107  CO2 23 25  GLUCOSE 135* 110*  BUN 8 9  CREATININE 0.72 0.71  CALCIUM 9.0 8.7*   Liver Function Tests:  Recent Labs Lab 11/18/16 1310  AST 34  ALT 30  ALKPHOS 55  BILITOT 0.3  PROT 7.9  ALBUMIN 4.8   No results for input(s): LIPASE, AMYLASE in the last 168 hours. No results for input(s): AMMONIA in the  last 168 hours. CBC:  Recent Labs Lab 11/18/16 1310 11/19/16 0345  WBC 6.7 4.7  HGB 15.1 13.8  HCT 42.7 39.4  MCV 98.2 98.3  PLT 156 138*   Cardiac Enzymes: No results for input(s): CKTOTAL, CKMB, CKMBINDEX, TROPONINI in the last 168 hours. BNP: Invalid input(s): POCBNP CBG: No results for input(s): GLUCAP in the last 168 hours. D-Dimer No results for input(s): DDIMER in the last 72 hours. Hgb A1c No results for input(s): HGBA1C in the last 72 hours. Lipid Profile No results for input(s): CHOL, HDL, LDLCALC, TRIG, CHOLHDL, LDLDIRECT in the last 72 hours. Thyroid function studies No results for input(s): TSH, T4TOTAL, T3FREE, THYROIDAB in the last 72 hours.  Invalid input(s): FREET3 Anemia work up No results for input(s): VITAMINB12, FOLATE, FERRITIN, TIBC, IRON, RETICCTPCT in the last 72 hours. Urinalysis    Component Value Date/Time   COLORURINE YELLOW 08/08/2016 0015   APPEARANCEUR CLEAR 08/08/2016 0015   LABSPEC 1.016 08/08/2016 0015   PHURINE 5.0 08/08/2016 0015   GLUCOSEU NEGATIVE 08/08/2016 0015   HGBUR NEGATIVE 08/08/2016 0015   BILIRUBINUR NEGATIVE 08/08/2016 0015   KETONESUR NEGATIVE 08/08/2016 0015   PROTEINUR NEGATIVE 08/08/2016 0015   NITRITE NEGATIVE 08/08/2016 0015   LEUKOCYTESUR NEGATIVE 08/08/2016 0015   Sepsis Labs Invalid input(s): PROCALCITONIN,  WBC,  LACTICIDVEN Microbiology No results found for this or any previous visit (from the past 240 hour(s)).   Time coordinating discharge: 40 minutes  SIGNED:  Dessa Phi, DO [facility]  Pager [phone]  If 7PM-7AM, please contact night-coverage [email or web address] Password [email or web address] 11/19/2016, 1:26 PM

## 2016-11-24 ENCOUNTER — Telehealth: Payer: Self-pay | Admitting: Internal Medicine

## 2016-11-24 NOTE — Telephone Encounter (Signed)
Medical Assistant left message on patient's home and cell voicemail. Voicemail states to give a call back to [place] with [facility] at [phone]. !!!Please ask patient to clarify his needs for oral surgery!!!

## 2016-11-24 NOTE — Telephone Encounter (Signed)
Pt called to speak with the nurse or the PCP since he is going to have dental surgery and he has some question that need to talk to the nurse, please follow up

## 2016-11-26 ENCOUNTER — Ambulatory Visit: Admit: 2016-11-26 | Payer: Medicaid Other | Admitting: Oral Surgery

## 2016-11-26 SURGERY — MULTIPLE EXTRACTION WITH ALVEOLOPLASTY
Anesthesia: General

## 2016-12-07 ENCOUNTER — Telehealth: Payer: Self-pay | Admitting: Internal Medicine

## 2016-12-07 NOTE — Telephone Encounter (Signed)
Patient needs to be evaluated for fall, please schedule an appointment.

## 2016-12-07 NOTE — Telephone Encounter (Signed)
Pt. Called requesting to speak with her nurse, pt. States that he fell and is in pain. Please f/u with pt.

## 2016-12-08 ENCOUNTER — Inpatient Hospital Stay: Payer: Self-pay | Admitting: Internal Medicine

## 2016-12-22 ENCOUNTER — Inpatient Hospital Stay: Payer: Self-pay | Admitting: Internal Medicine

## 2017-02-01 ENCOUNTER — Telehealth: Payer: Self-pay | Admitting: Internal Medicine

## 2017-02-01 NOTE — Telephone Encounter (Signed)
[place] called to request an update on the patients joystic of his wheelchair.

## 2017-02-18 ENCOUNTER — Telehealth: Payer: Self-pay | Admitting: Internal Medicine

## 2017-02-18 NOTE — Telephone Encounter (Signed)
Waiting on battery script from PCP. Form has been signed

## 2017-02-18 NOTE — Telephone Encounter (Signed)
Ebony from [place] called requesting to know the status of the fax that was faxed on 11/5. Please f/u

## 2017-03-22 DIAGNOSIS — E7800 Pure hypercholesterolemia, unspecified: Secondary | ICD-10-CM

## 2017-03-22 HISTORY — DX: Pure hypercholesterolemia, unspecified: E78.00

## 2017-04-19 ENCOUNTER — Inpatient Hospital Stay (HOSPITAL_COMMUNITY)
Admission: EM | Admit: 2017-04-19 | Discharge: 2017-04-21 | DRG: 392 | Disposition: A | Discharge status: Home / Self Care | Payer: Medicaid Other | Attending: Family Medicine | Admitting: Family Medicine

## 2017-04-19 ENCOUNTER — Other Ambulatory Visit: Payer: Self-pay

## 2017-04-19 ENCOUNTER — Emergency Department (HOSPITAL_COMMUNITY): Payer: Medicaid Other

## 2017-04-19 ENCOUNTER — Encounter (HOSPITAL_COMMUNITY): Payer: Self-pay

## 2017-04-19 DIAGNOSIS — Z79899 Other long term (current) drug therapy: Secondary | ICD-10-CM

## 2017-04-19 DIAGNOSIS — K649 Unspecified hemorrhoids: Secondary | ICD-10-CM | POA: Diagnosis present

## 2017-04-19 DIAGNOSIS — Z885 Allergy status to narcotic agent status: Secondary | ICD-10-CM

## 2017-04-19 DIAGNOSIS — K625 Hemorrhage of anus and rectum: Secondary | ICD-10-CM | POA: Diagnosis not present

## 2017-04-19 DIAGNOSIS — K529 Noninfective gastroenteritis and colitis, unspecified: Principal | ICD-10-CM | POA: Diagnosis present

## 2017-04-19 DIAGNOSIS — M5442 Lumbago with sciatica, left side: Secondary | ICD-10-CM

## 2017-04-19 DIAGNOSIS — G40909 Epilepsy, unspecified, not intractable, without status epilepticus: Secondary | ICD-10-CM

## 2017-04-19 DIAGNOSIS — M542 Cervicalgia: Secondary | ICD-10-CM | POA: Diagnosis present

## 2017-04-19 DIAGNOSIS — G8929 Other chronic pain: Secondary | ICD-10-CM

## 2017-04-19 DIAGNOSIS — K922 Gastrointestinal hemorrhage, unspecified: Secondary | ICD-10-CM

## 2017-04-19 DIAGNOSIS — Z9660 Presence of unspecified orthopedic joint implant: Secondary | ICD-10-CM | POA: Diagnosis present

## 2017-04-19 DIAGNOSIS — F329 Major depressive disorder, single episode, unspecified: Secondary | ICD-10-CM | POA: Diagnosis present

## 2017-04-19 DIAGNOSIS — Z8619 Personal history of other infectious and parasitic diseases: Secondary | ICD-10-CM

## 2017-04-19 DIAGNOSIS — F17210 Nicotine dependence, cigarettes, uncomplicated: Secondary | ICD-10-CM | POA: Diagnosis present

## 2017-04-19 DIAGNOSIS — F331 Major depressive disorder, recurrent, moderate: Secondary | ICD-10-CM

## 2017-04-19 DIAGNOSIS — F1010 Alcohol abuse, uncomplicated: Secondary | ICD-10-CM | POA: Diagnosis present

## 2017-04-19 DIAGNOSIS — K219 Gastro-esophageal reflux disease without esophagitis: Secondary | ICD-10-CM | POA: Diagnosis present

## 2017-04-19 DIAGNOSIS — M549 Dorsalgia, unspecified: Secondary | ICD-10-CM | POA: Diagnosis present

## 2017-04-19 HISTORY — DX: Hemorrhage of anus and rectum: K62.5

## 2017-04-19 LAB — CBC
HCT: 40.3 % (ref 39.0–52.0)
HEMATOCRIT: 39.8 % (ref 39.0–52.0)
HEMOGLOBIN: 14 g/dL (ref 13.0–17.0)
Hemoglobin: 13.4 g/dL (ref 13.0–17.0)
MCH: 32.9 pg (ref 26.0–34.0)
MCH: 32.5 pg (ref 26.0–34.0)
MCHC: 34.7 g/dL (ref 30.0–36.0)
MCHC: 33.7 g/dL (ref 30.0–36.0)
MCV: 94.8 fL (ref 78.0–100.0)
MCV: 96.6 fL (ref 78.0–100.0)
PLATELETS: 213 10*3/uL (ref 150–400)
Platelets: 195 10*3/uL (ref 150–400)
RBC: 4.25 MIL/uL (ref 4.22–5.81)
RBC: 4.12 MIL/uL — ABNORMAL LOW (ref 4.22–5.81)
RDW: 14 % (ref 11.5–15.5)
RDW: 14 % (ref 11.5–15.5)
WBC: 7 10*3/uL (ref 4.0–10.5)
WBC: 5.9 10*3/uL (ref 4.0–10.5)

## 2017-04-19 LAB — COMPREHENSIVE METABOLIC PANEL
ALBUMIN: 4.6 g/dL (ref 3.5–5.0)
ALK PHOS: 55 U/L (ref 38–126)
ALT: 36 U/L (ref 17–63)
ANION GAP: 11 (ref 5–15)
AST: 46 U/L — ABNORMAL HIGH (ref 15–41)
BILIRUBIN TOTAL: 0.3 mg/dL (ref 0.3–1.2)
BUN: 12 mg/dL (ref 6–20)
CALCIUM: 9 mg/dL (ref 8.9–10.3)
CO2: 24 mmol/L (ref 22–32)
CREATININE: 0.7 mg/dL (ref 0.61–1.24)
Chloride: 108 mmol/L (ref 101–111)
GFR calc Af Amer: >60 mL/min (ref >60)
GFR calc non Af Amer: >60 mL/min (ref >60)
GLUCOSE: 84 mg/dL (ref 65–99)
Potassium: 3.8 mmol/L (ref 3.5–5.1)
Sodium: 143 mmol/L (ref 135–145)
TOTAL PROTEIN: 7.9 g/dL (ref 6.5–8.1)

## 2017-04-19 LAB — TYPE AND SCREEN
ABO/RH(D): O POS
ANTIBODY SCREEN: NEGATIVE

## 2017-04-19 LAB — POC OCCULT BLOOD, ED: Fecal Occult Bld: NEGATIVE

## 2017-04-19 LAB — LIPASE, BLOOD: Lipase: 42 U/L (ref 11–51)

## 2017-04-19 MED ORDER — OLANZAPINE 10 MG PO TABS
10.0000 mg | ORAL_TABLET | Freq: Every day | ORAL | Status: DC
  Administered 2017-04-20 – 2017-04-21 (×2): 10 mg via ORAL
  Filled 2017-04-19 (×2): qty 1

## 2017-04-19 MED ORDER — TRAMADOL HCL 50 MG PO TABS: 50.0000 mg | ORAL_TABLET | Freq: Four times a day (QID) | ORAL | Status: DC | PRN

## 2017-04-19 MED ORDER — METRONIDAZOLE IN NACL 5-0.79 MG/ML-% IV SOLN
500.0000 mg | Freq: Three times a day (TID) | INTRAVENOUS | Status: DC
  Administered 2017-04-19 – 2017-04-21 (×5): 500 mg via INTRAVENOUS
  Filled 2017-04-19 (×6): qty 100

## 2017-04-19 MED ORDER — SODIUM CHLORIDE 0.9 % IV SOLN
INTRAVENOUS | Status: DC
  Administered 2017-04-19 – 2017-04-20 (×4): via INTRAVENOUS

## 2017-04-19 MED ORDER — PRAVASTATIN SODIUM 20 MG PO TABS
40.0000 mg | ORAL_TABLET | Freq: Every day | ORAL | Status: DC
  Administered 2017-04-20 – 2017-04-21 (×2): 40 mg via ORAL
  Filled 2017-04-19 (×2): qty 2
  Filled 2017-04-19: qty 1

## 2017-04-19 MED ORDER — DIVALPROEX SODIUM ER 500 MG PO TB24
500.0000 mg | ORAL_TABLET | Freq: Three times a day (TID) | ORAL | Status: DC
  Administered 2017-04-20 – 2017-04-21 (×4): 500 mg via ORAL
  Filled 2017-04-19 (×5): qty 1

## 2017-04-19 MED ORDER — ACETAMINOPHEN 325 MG PO TABS: 650.0000 mg | ORAL_TABLET | Freq: Four times a day (QID) | ORAL | Status: DC | PRN

## 2017-04-19 MED ORDER — ADULT MULTIVITAMIN W/MINERALS CH
1.0000 | ORAL_TABLET | Freq: Every day | ORAL | Status: DC
  Administered 2017-04-20 – 2017-04-21 (×2): 1 via ORAL
  Filled 2017-04-19 (×2): qty 1

## 2017-04-19 MED ORDER — ONDANSETRON HCL 4 MG/2ML IJ SOLN: 4.0000 mg | Freq: Four times a day (QID) | INTRAMUSCULAR | Status: DC | PRN

## 2017-04-19 MED ORDER — CIPROFLOXACIN IN D5W 400 MG/200ML IV SOLN
400.0000 mg | Freq: Two times a day (BID) | INTRAVENOUS | Status: DC
  Administered 2017-04-19 – 2017-04-20 (×3): 400 mg via INTRAVENOUS
  Filled 2017-04-19 (×4): qty 200

## 2017-04-19 MED ORDER — LORAZEPAM 2 MG/ML IJ SOLN: 1.0000 mg | Freq: Four times a day (QID) | INTRAMUSCULAR | Status: DC | PRN

## 2017-04-19 MED ORDER — PANTOPRAZOLE SODIUM 40 MG PO TBEC: 40.0000 mg | DELAYED_RELEASE_TABLET | Freq: Every day | ORAL | Status: DC

## 2017-04-19 MED ORDER — DIVALPROEX SODIUM ER 500 MG PO TB24
500.0000 mg | ORAL_TABLET | ORAL | Status: AC
  Administered 2017-04-19: 500 mg via ORAL
  Filled 2017-04-19: qty 1

## 2017-04-19 MED ORDER — FOLIC ACID 1 MG PO TABS
1.0000 mg | ORAL_TABLET | Freq: Every day | ORAL | Status: DC
  Administered 2017-04-20 – 2017-04-21 (×2): 1 mg via ORAL
  Filled 2017-04-19 (×2): qty 1

## 2017-04-19 MED ORDER — ONDANSETRON HCL 4 MG PO TABS: 4.0000 mg | ORAL_TABLET | Freq: Four times a day (QID) | ORAL | Status: DC | PRN

## 2017-04-19 MED ORDER — TRAZODONE HCL 50 MG PO TABS
50.0000 mg | ORAL_TABLET | Freq: Every day | ORAL | Status: DC
  Administered 2017-04-19 – 2017-04-20 (×2): 50 mg via ORAL
  Filled 2017-04-19 (×2): qty 1

## 2017-04-19 MED ORDER — LORAZEPAM 1 MG PO TABS
1.0000 mg | ORAL_TABLET | Freq: Four times a day (QID) | ORAL | Status: DC | PRN
  Filled 2017-04-19: qty 1

## 2017-04-19 MED ORDER — ACETAMINOPHEN 650 MG RE SUPP: 650.0000 mg | Freq: Four times a day (QID) | RECTAL | Status: DC | PRN

## 2017-04-19 MED ORDER — VITAMIN B-1 100 MG PO TABS
100.0000 mg | ORAL_TABLET | Freq: Every day | ORAL | Status: DC
  Administered 2017-04-20 – 2017-04-21 (×2): 100 mg via ORAL
  Filled 2017-04-19 (×2): qty 1

## 2017-04-19 MED ORDER — IOPAMIDOL (ISOVUE-300) INJECTION 61%
INTRAVENOUS | Status: AC
  Administered 2017-04-19: 100 mL
  Filled 2017-04-19: qty 100

## 2017-04-19 MED ORDER — THIAMINE HCL 100 MG/ML IJ SOLN: 100.0000 mg | Freq: Every day | INTRAMUSCULAR | Status: DC

## 2017-04-19 NOTE — ED Triage Notes (Signed)
Patient reports mixed color of blood from the rectum. patient states sometimes it is bright red and sometimes it is dark. Patient also c/o low back pain

## 2017-04-19 NOTE — ED Provider Notes (Signed)
[facility] DEPT Provider Note   CSN: [id number] Arrival date & time: 04/19/17  1839     History   Chief Complaint Chief Complaint  Patient presents with  . Rectal Bleeding  . Back Pain  . Headache    HPI Zamere Pasternak is a 52 y.o. male.  Patient reports a history of blood per rectum several months ago.  Without specific findings.  States had a colonoscopy a year ago.  Known to have hemorrhoids.  Today with at least 3 bloody bowel movements reddish blood staining all of the commode water red.  Associated with abdominal cramping.  Patient is on omeprazole for history of GERD.  Patient denies any lightheadedness or feeling like is going to pass out.  Patient's GI doctor is Dr. Renard Hamper in [place].      Past Medical History:  Diagnosis Date  . Back pain   . Depression   . Neck pain   . Other acariasis    Hep C  . Rectal bleeding   . Seizures [facility])     Patient Active Problem List   Diagnosis Date Noted  . Rectal bleeding 04/19/2017  . Colitis 04/19/2017  . Rectal bleed 11/18/2016  . Dental abscess 09/01/2016  . Moderate episode of recurrent major depressive disorder ([place]) 09/01/2016  . Smoking addiction 09/01/2016  . Depression 08/08/2016  . Tobacco abuse 08/08/2016  . Alcohol abuse 08/08/2016  . Seizure ([place]) 08/08/2016  . Alcoholic intoxication with complication ([place])   . Numbness and tingling in both hands 01/22/2016  . Chronic pain syndrome 01/22/2016  . Assistance needed for mobility 01/22/2016  . Healthcare maintenance 03/10/2015  . Dyslipidemia 08/26/2014  . Bilateral low back pain with left-sided sciatica 02/21/2014  . GERD (gastroesophageal reflux disease) 05/01/2013  . Seizure disorder ([place]) 05/01/2013  . Neck pain, chronic 10/31/2012  . Chronic bilateral low back pain with left-sided sciatica 10/31/2012    Past Surgical History:  Procedure Laterality Date  . HERNIA REPAIR    . JOINT REPLACEMENT         Home  Medications    Prior to Admission medications   Medication Sig Start Date End Date Taking? Authorizing Provider  divalproex (DEPAKOTE ER) 500 MG 24 hr tablet Take 1 tablet (500 mg total) by mouth 3 (three) times daily. 05/19/16  Yes Jegede, Olugbemiga E, MD  OLANZapine (ZYPREXA) 10 MG tablet Take 10 mg by mouth daily. 03/31/17  Yes [provider]  omeprazole (PRILOSEC) 20 MG capsule TAKE 2 CAPSULES BY MOUTH DAILY 09/15/16  Yes Jegede, Olugbemiga E, MD  pravastatin (PRAVACHOL) 40 MG tablet Take 1 tablet (40 mg total) by mouth daily. 05/19/16  Yes Tresa Garter, MD  traMADol (ULTRAM) 50 MG tablet Take 50 mg by mouth every 6 (six) hours as needed for moderate pain.  03/20/17  Yes [provider]  traZODone (DESYREL) 100 MG tablet Take 1 tablet (100 mg total) by mouth at bedtime. Patient taking differently: Take 50 mg by mouth at bedtime.  05/19/16  Yes Jegede, Olugbemiga E, MD  acetaminophen-codeine (TYLENOL #3) 300-30 MG tablet TAKE 1 TABLET BY MOUTH EVERY 6 HOURS AS NEEDED FOR MODERATE PAIN Patient not taking: Reported on 04/19/2017 11/05/16   Tresa Garter, MD  DULoxetine (CYMBALTA) 30 MG capsule Take 1 capsule (30 mg total) by mouth daily. Patient not taking: Reported on 04/19/2017 09/01/16   Tresa Garter, MD  hydrocortisone (ANUSOL-HC) 25 MG suppository Place 1 suppository (25 mg total) rectally 2 (two)  times daily. Patient not taking: Reported on 04/19/2017 11/19/16   Dessa Phi, DO  nicotine (NICODERM CQ - DOSED IN MG/24 HOURS) 21 mg/24hr patch Place 1 patch (21 mg total) onto the skin daily. Patient not taking: Reported on 04/19/2017 09/19/16   Tresa Garter, MD    Family History Family History  Problem Relation Age of Onset  . Cancer Mother   . Cancer Father   . Cancer Sister   . Cancer Brother   . Cancer Maternal Grandmother     Social History Social History   Tobacco Use  . Smoking status: Current Every Day Smoker    Packs/day: 0.25     Years: 40.00    Pack years: 10.00  . Smokeless tobacco: Never Used  . Tobacco comment: smoking 5 cigs/day  Substance Use Topics  . Alcohol use: Yes    Alcohol/week: 8.4 oz    Types: 14 Cans of beer per week    Comment: pt states "2 24oz daily when I can"  . Drug use: Yes    Types: Marijuana     Allergies   Codeine   Review of Systems Review of Systems  Constitutional: Negative for fever.  HENT: Negative for congestion.   Eyes: Negative for visual disturbance.  Respiratory: Negative for shortness of breath.   Cardiovascular: Negative for chest pain.  Gastrointestinal: Positive for abdominal pain and blood in stool. Negative for nausea.  Genitourinary: Negative for dysuria.  Musculoskeletal: Negative for back pain.  Skin: Negative for rash.  Neurological: Negative for light-headedness.  Hematological: Does not bruise/bleed easily.  Psychiatric/Behavioral: Negative for confusion.     Physical Exam Updated Vital Signs BP (!) 133/95   Pulse 71   Temp 98.1 F (36.7 C) (Oral)   Resp 16   Ht 1.753 m (5\' 9" )   Wt 81.8 kg (180 lb 5 oz)   SpO2 98%   BMI 26.63 kg/m   Physical Exam  Constitutional: He is oriented to person, place, and time. He appears well-developed and well-nourished. No distress.  HENT:  Head: Normocephalic and atraumatic.  Mouth/Throat: Oropharynx is clear and moist.  Eyes: Conjunctivae and EOM are normal. Pupils are equal, round, and reactive to light.  Neck: Neck supple.  Cardiovascular: Normal rate, regular rhythm and normal heart sounds.  Pulmonary/Chest: Effort normal and breath sounds normal. No respiratory distress.  Abdominal: Soft. Bowel sounds are normal. There is no tenderness.  Genitourinary: Rectal exam shows guaiac negative stool.  Genitourinary Comments: Undergarments showed some brownish reddish blood.  On rectal exam patient has external hemorrhoids and external skin tags.  No gross blood on exam.  No stool in the vault.  No mass no  tenderness.  Musculoskeletal: Normal range of motion. He exhibits no edema.  Neurological: He is alert and oriented to person, place, and time. No cranial nerve deficit or sensory deficit. He exhibits normal muscle tone. Coordination normal.  Skin: Skin is warm.  Nursing note and vitals reviewed.    ED Treatments / Results  Labs (all labs ordered are listed, but only abnormal results are displayed) Labs Reviewed  COMPREHENSIVE METABOLIC PANEL - Abnormal; Notable for the following components:      Result Value   AST 46 (*)    All other components within normal limits  CBC  LIPASE, BLOOD  BASIC METABOLIC PANEL  CBC  CBC  CBC  POC OCCULT BLOOD, ED  TYPE AND SCREEN    EKG  EKG Interpretation None  Radiology Ct Abdomen Pelvis W Contrast  Result Date: 04/19/2017 CLINICAL DATA:  Blood from the rectum. EXAM: CT ABDOMEN AND PELVIS WITH CONTRAST TECHNIQUE: Multidetector CT imaging of the abdomen and pelvis was performed using the standard protocol following bolus administration of intravenous contrast. CONTRAST:  156mL ISOVUE-300 IOPAMIDOL (ISOVUE-300) INJECTION 61% COMPARISON:  November 18, 2016 FINDINGS: Lower chest: No acute abnormality. Hepatobiliary: No focal liver lesion is identified. There is diffuse low density of the liver. The gallbladder is normal. The biliary tree is normal. Pancreas: Unremarkable. No pancreatic ductal dilatation or surrounding inflammatory changes. Spleen: Normal in size without focal abnormality. Adrenals/Urinary Tract: Adrenal glands are unremarkable. Kidneys are normal, without renal calculi, focal lesion, or hydronephrosis. Bladder is unremarkable. Stomach/Bowel: There is mild diffuse bowel wall thickening the distal descending colon, sigmoid colon and rectum. There is no small bowel obstruction. The appendix is normal. The stomach is normal. Vascular/Lymphatic: Aortic atherosclerosis. No enlarged abdominal or pelvic lymph nodes. Reproductive: Prostate  is unremarkable. Other: None. Musculoskeletal: Degenerative joint changes of the lower lumbar spine are identified. IMPRESSION: Mild diffuse bowel wall thickening of the distal descending colon, sigmoid colon and rectum suggesting colitis. There is no bowel obstruction. Electronically Signed   By: Abelardo Diesel M.D.   On: 04/19/2017 21:24    Procedures Procedures (including critical care time)  Medications Ordered in ED Medications  0.9 %  sodium chloride infusion ( Intravenous New Bag/Given 04/19/17 2126)  divalproex (DEPAKOTE ER) 24 hr tablet 500 mg (not administered)  OLANZapine (ZYPREXA) tablet 10 mg (not administered)  pantoprazole (PROTONIX) EC tablet 40 mg (not administered)  pravastatin (PRAVACHOL) tablet 40 mg (not administered)  traMADol (ULTRAM) tablet 50 mg (not administered)  traZODone (DESYREL) tablet 50 mg (not administered)  acetaminophen (TYLENOL) tablet 650 mg (not administered)    Or  acetaminophen (TYLENOL) suppository 650 mg (not administered)  ondansetron (ZOFRAN) tablet 4 mg (not administered)    Or  ondansetron (ZOFRAN) injection 4 mg (not administered)  iopamidol (ISOVUE-300) 61 % injection (100 mLs  Contrast Given 04/19/17 2101)     Initial Impression / Assessment and Plan / ED Course  I have reviewed the triage vital signs and the nursing notes.  Pertinent labs & imaging results that were available during my care of the patient were reviewed by me and considered in my medical decision making (see chart for details).    Patient presenting with a history of GI bleed.  Vital signs no hypotension no tachycardia.  Hemoglobin normal.  Type and screen is been done.  On rectal exam no gross blood.  However CT scan shows diffuse colitis.  Probably the cause of the abdominal cramping and also probably the cause of the blood which sounds like there was several episodes today.  Discussed with hospitalist for admission and monitoring overnight to make sure that his  hemoglobin does  Not drop or that he becomes unstable.  Final Clinical Impressions(s) / ED Diagnoses   Final diagnoses:  Lower GI bleed  Colitis    ED Discharge Orders    None       Fredia Sorrow, MD 04/19/17 [phone]

## 2017-04-19 NOTE — H&P (Signed)
History and Physical    Clemens Lachman YIR:[id number] DOB: 1965-07-05 DOA: 04/19/2017  PCP: Tresa Garter, MD  Patient coming from: Home.  Chief Complaint: Rectal bleeding.  HPI: Cody Delacruz is a 52 y.o. male with history of alcohol abuse, depression, seizures presents to the ER with complaints of rectal bleeding.  Patient states he has been having chronic rectal bleeding but today it was persistent.  Patient had multiple episodes of rectal bleeding today.  Patient states he has had 2 colonoscopies in the last 3 years.  Which showed polyps.  Is being followed at [facility] to drinking alcohol every day.  Denies any abdominal pain nausea vomiting.  ED Course: In the ER patient remains hemodynamically stable hemoglobin is around 14.  Since patient states he has had multiple episodes patient admitted for further observation.  Patient denies taking any NSAIDs or aspirin.  Patient CT scan was done since patient had some abdominal discomfort which was periumbilical.  CT scan shows colitis.  Patient is afebrile and has no leukocytosis.  Denies using any antibiotics recently.  Denies any diarrhea.  Review of Systems: As per HPI, rest all negative.   Past Medical History:  Diagnosis Date  . Back pain   . Depression   . Neck pain   . Other acariasis    Hep C  . Rectal bleeding   . Seizures ([place])     Past Surgical History:  Procedure Laterality Date  . HERNIA REPAIR    . JOINT REPLACEMENT       reports that he has been smoking.  He has a 10.00 pack-year smoking history. he has never used smokeless tobacco. He reports that he drinks about 8.4 oz of alcohol per week. He reports that he uses drugs. Drug: Marijuana.  Allergies  Allergen Reactions  . Codeine Other (See Comments)    Unknown childhood allergy    Family History  Problem Relation Age of Onset  . Cancer Mother   . Cancer Father   . Cancer Sister   . Cancer Brother   . Cancer Maternal Grandmother      Prior to Admission medications   Medication Sig Start Date End Date Taking? Authorizing Provider  divalproex (DEPAKOTE ER) 500 MG 24 hr tablet Take 1 tablet (500 mg total) by mouth 3 (three) times daily. 05/19/16  Yes Jegede, Olugbemiga E, MD  OLANZapine (ZYPREXA) 10 MG tablet Take 10 mg by mouth daily. 03/31/17  Yes [provider]  omeprazole (PRILOSEC) 20 MG capsule TAKE 2 CAPSULES BY MOUTH DAILY 09/15/16  Yes Jegede, Olugbemiga E, MD  pravastatin (PRAVACHOL) 40 MG tablet Take 1 tablet (40 mg total) by mouth daily. 05/19/16  Yes Tresa Garter, MD  traMADol (ULTRAM) 50 MG tablet Take 50 mg by mouth every 6 (six) hours as needed for moderate pain.  03/20/17  Yes [provider]  traZODone (DESYREL) 100 MG tablet Take 1 tablet (100 mg total) by mouth at bedtime. Patient taking differently: Take 50 mg by mouth at bedtime.  05/19/16  Yes Jegede, Olugbemiga E, MD  acetaminophen-codeine (TYLENOL #3) 300-30 MG tablet TAKE 1 TABLET BY MOUTH EVERY 6 HOURS AS NEEDED FOR MODERATE PAIN Patient not taking: Reported on 04/19/2017 11/05/16   Tresa Garter, MD  DULoxetine (CYMBALTA) 30 MG capsule Take 1 capsule (30 mg total) by mouth daily. Patient not taking: Reported on 04/19/2017 09/01/16   Tresa Garter, MD  hydrocortisone (ANUSOL-HC) 25 MG suppository Place 1 suppository (25 mg  total) rectally 2 (two) times daily. Patient not taking: Reported on 04/19/2017 11/19/16   Dessa Phi, DO  nicotine (NICODERM CQ - DOSED IN MG/24 HOURS) 21 mg/24hr patch Place 1 patch (21 mg total) onto the skin daily. Patient not taking: Reported on 04/19/2017 09/19/16   Tresa Garter, MD    Physical Exam: Vitals:   04/19/17 2126 04/19/17 2130 04/19/17 2200 04/19/17 2230  BP: (!) 140/98 (!) 122/92 (!) 121/92 (!) 133/95  Pulse: 72 64 73 71  Resp: 17 13 16 16   Temp:      TempSrc:      SpO2: 98% 96% 99% 98%  Weight:      Height:          Constitutional: Moderately built and  nourished. Vitals:   04/19/17 2126 04/19/17 2130 04/19/17 2200 04/19/17 2230  BP: (!) 140/98 (!) 122/92 (!) 121/92 (!) 133/95  Pulse: 72 64 73 71  Resp: 17 13 16 16   Temp:      TempSrc:      SpO2: 98% 96% 99% 98%  Weight:      Height:       Eyes: Anicteric no pallor. ENMT: No discharge from the ears eyes nose or mouth. Neck: No masses, no neck rigidity. Respiratory: No rhonchi or crepitations. Cardiovascular: S1-S2 heard no murmurs appreciated. Abdomen: Soft nontender bowel sounds present. Musculoskeletal: No edema.  No joint effusion. Skin: No rash.  Skin appears warm. Neurologic: Alert awake oriented to time place and person.  Moves all extremities. Psychiatric: Appears normal.  Normal affect.   Labs on Admission: I have personally reviewed following labs and imaging studies  CBC: Recent Labs  Lab 04/19/17 1901  WBC 7.0  HGB 14.0  HCT 40.3  MCV 94.8  PLT 710   Basic Metabolic Panel: Recent Labs  Lab 04/19/17 1901  NA 143  K 3.8  CL 108  CO2 24  GLUCOSE 84  BUN 12  CREATININE 0.70  CALCIUM 9.0   GFR: Estimated Creatinine Clearance: 106.8 mL/min (by C-G formula based on SCr of 0.7 mg/dL). Liver Function Tests: Recent Labs  Lab 04/19/17 1901  AST 46*  ALT 36  ALKPHOS 55  BILITOT 0.3  PROT 7.9  ALBUMIN 4.6   Recent Labs  Lab 04/19/17 1901  LIPASE 42   No results for input(s): AMMONIA in the last 168 hours. Coagulation Profile: No results for input(s): INR, PROTIME in the last 168 hours. Cardiac Enzymes: No results for input(s): CKTOTAL, CKMB, CKMBINDEX, TROPONINI in the last 168 hours. BNP (last 3 results) No results for input(s): PROBNP in the last 8760 hours. HbA1C: No results for input(s): HGBA1C in the last 72 hours. CBG: No results for input(s): GLUCAP in the last 168 hours. Lipid Profile: No results for input(s): CHOL, HDL, LDLCALC, TRIG, CHOLHDL, LDLDIRECT in the last 72 hours. Thyroid Function Tests: No results for input(s): TSH,  T4TOTAL, FREET4, T3FREE, THYROIDAB in the last 72 hours. Anemia Panel: No results for input(s): VITAMINB12, FOLATE, FERRITIN, TIBC, IRON, RETICCTPCT in the last 72 hours. Urine analysis:    Component Value Date/Time   COLORURINE YELLOW 08/08/2016 0015   APPEARANCEUR CLEAR 08/08/2016 0015   LABSPEC 1.016 08/08/2016 0015   PHURINE 5.0 08/08/2016 0015   GLUCOSEU NEGATIVE 08/08/2016 0015   HGBUR NEGATIVE 08/08/2016 0015   BILIRUBINUR NEGATIVE 08/08/2016 0015   KETONESUR NEGATIVE 08/08/2016 0015   PROTEINUR NEGATIVE 08/08/2016 0015   NITRITE NEGATIVE 08/08/2016 0015   LEUKOCYTESUR NEGATIVE 08/08/2016 0015   Sepsis Labs: @  LABRCNTIP(procalcitonin:4,lacticidven:4) )No results found for this or any previous visit (from the past 240 hour(s)).   Radiological Exams on Admission: Ct Abdomen Pelvis W Contrast  Result Date: 04/19/2017 CLINICAL DATA:  Blood from the rectum. EXAM: CT ABDOMEN AND PELVIS WITH CONTRAST TECHNIQUE: Multidetector CT imaging of the abdomen and pelvis was performed using the standard protocol following bolus administration of intravenous contrast. CONTRAST:  145mL ISOVUE-300 IOPAMIDOL (ISOVUE-300) INJECTION 61% COMPARISON:  November 18, 2016 FINDINGS: Lower chest: No acute abnormality. Hepatobiliary: No focal liver lesion is identified. There is diffuse low density of the liver. The gallbladder is normal. The biliary tree is normal. Pancreas: Unremarkable. No pancreatic ductal dilatation or surrounding inflammatory changes. Spleen: Normal in size without focal abnormality. Adrenals/Urinary Tract: Adrenal glands are unremarkable. Kidneys are normal, without renal calculi, focal lesion, or hydronephrosis. Bladder is unremarkable. Stomach/Bowel: There is mild diffuse bowel wall thickening the distal descending colon, sigmoid colon and rectum. There is no small bowel obstruction. The appendix is normal. The stomach is normal. Vascular/Lymphatic: Aortic atherosclerosis. No enlarged  abdominal or pelvic lymph nodes. Reproductive: Prostate is unremarkable. Other: None. Musculoskeletal: Degenerative joint changes of the lower lumbar spine are identified. IMPRESSION: Mild diffuse bowel wall thickening of the distal descending colon, sigmoid colon and rectum suggesting colitis. There is no bowel obstruction. Electronically Signed   By: Abelardo Diesel M.D.   On: 04/19/2017 21:24     Assessment/Plan Principal Problem:   Rectal bleeding Active Problems:   Seizure disorder (HCC)   Alcohol abuse   Colitis    1. Rectal bleeding -patient states he has had colonoscopy twice within the last 3 years with showed polyp and is being followed in our practice.  Bleeding could be from hemorrhoids.  Closely follow CBC. 2. CT scan showing colitis patient is placed on Cipro Flagyl.  Check lactic acid to rule out any ischemia. 3. History of seizure disorder on Depakote. 4. Depression on Zyprexa and trazodone. 5. Alcohol abuse placed on CIWA protocol.   DVT prophylaxis: SCDs. Code Status: Full code. Family Communication: Discussed with patient. Disposition Plan: Home. Consults called: None. Admission status: Observation.   Rise Patience MD [facility] Pager [phone].  If 7PM-7AM, please contact night-coverage [email or web address] Password [facility]  04/19/2017, 11:22 PM

## 2017-04-20 DIAGNOSIS — K529 Noninfective gastroenteritis and colitis, unspecified: Secondary | ICD-10-CM | POA: Diagnosis present

## 2017-04-20 DIAGNOSIS — K922 Gastrointestinal hemorrhage, unspecified: Secondary | ICD-10-CM | POA: Diagnosis present

## 2017-04-20 DIAGNOSIS — K219 Gastro-esophageal reflux disease without esophagitis: Secondary | ICD-10-CM | POA: Diagnosis present

## 2017-04-20 DIAGNOSIS — F1010 Alcohol abuse, uncomplicated: Secondary | ICD-10-CM | POA: Diagnosis present

## 2017-04-20 DIAGNOSIS — Z9660 Presence of unspecified orthopedic joint implant: Secondary | ICD-10-CM | POA: Diagnosis present

## 2017-04-20 DIAGNOSIS — M542 Cervicalgia: Secondary | ICD-10-CM | POA: Diagnosis present

## 2017-04-20 DIAGNOSIS — M549 Dorsalgia, unspecified: Secondary | ICD-10-CM | POA: Diagnosis present

## 2017-04-20 DIAGNOSIS — Z79899 Other long term (current) drug therapy: Secondary | ICD-10-CM | POA: Diagnosis not present

## 2017-04-20 DIAGNOSIS — F17210 Nicotine dependence, cigarettes, uncomplicated: Secondary | ICD-10-CM | POA: Diagnosis present

## 2017-04-20 DIAGNOSIS — Z8619 Personal history of other infectious and parasitic diseases: Secondary | ICD-10-CM | POA: Diagnosis not present

## 2017-04-20 DIAGNOSIS — K625 Hemorrhage of anus and rectum: Secondary | ICD-10-CM | POA: Diagnosis not present

## 2017-04-20 DIAGNOSIS — Z885 Allergy status to narcotic agent status: Secondary | ICD-10-CM | POA: Diagnosis not present

## 2017-04-20 DIAGNOSIS — G40909 Epilepsy, unspecified, not intractable, without status epilepticus: Secondary | ICD-10-CM | POA: Diagnosis present

## 2017-04-20 DIAGNOSIS — F329 Major depressive disorder, single episode, unspecified: Secondary | ICD-10-CM | POA: Diagnosis present

## 2017-04-20 DIAGNOSIS — K649 Unspecified hemorrhoids: Secondary | ICD-10-CM | POA: Diagnosis present

## 2017-04-20 LAB — BASIC METABOLIC PANEL
ANION GAP: 9 (ref 5–15)
BUN: 10 mg/dL (ref 6–20)
CALCIUM: 7.9 mg/dL — AB (ref 8.9–10.3)
CO2: 23 mmol/L (ref 22–32)
CREATININE: 0.69 mg/dL (ref 0.61–1.24)
Chloride: 107 mmol/L (ref 101–111)
GFR calc Af Amer: >60 mL/min (ref >60)
GLUCOSE: 76 mg/dL (ref 65–99)
Potassium: 3.7 mmol/L (ref 3.5–5.1)
Sodium: 139 mmol/L (ref 135–145)

## 2017-04-20 LAB — CBC
HCT: 37 % — ABNORMAL LOW (ref 39.0–52.0)
Hemoglobin: 12.6 g/dL — ABNORMAL LOW (ref 13.0–17.0)
MCH: 32.4 pg (ref 26.0–34.0)
MCHC: 34.1 g/dL (ref 30.0–36.0)
MCV: 95.1 fL (ref 78.0–100.0)
Platelets: 170 10*3/uL (ref 150–400)
RBC: 3.89 MIL/uL — ABNORMAL LOW (ref 4.22–5.81)
RDW: 14 % (ref 11.5–15.5)
WBC: 5.7 10*3/uL (ref 4.0–10.5)

## 2017-04-20 LAB — LACTIC ACID, PLASMA: LACTIC ACID, VENOUS: 1.7 mmol/L (ref 0.5–1.9)

## 2017-04-20 LAB — HEMOGLOBIN AND HEMATOCRIT, BLOOD
HEMATOCRIT: 36.4 % — AB (ref 39.0–52.0)
Hemoglobin: 12.5 g/dL — ABNORMAL LOW (ref 13.0–17.0)

## 2017-04-20 MED ORDER — PANTOPRAZOLE SODIUM 40 MG PO TBEC
40.0000 mg | DELAYED_RELEASE_TABLET | Freq: Two times a day (BID) | ORAL | Status: DC
  Administered 2017-04-20 – 2017-04-21 (×3): 40 mg via ORAL
  Filled 2017-04-20 (×2): qty 1

## 2017-04-20 MED ORDER — DIAZEPAM 2 MG PO TABS
2.0000 mg | ORAL_TABLET | Freq: Three times a day (TID) | ORAL | Status: DC
  Administered 2017-04-20 – 2017-04-21 (×4): 2 mg via ORAL
  Filled 2017-04-20 (×4): qty 1

## 2017-04-20 MED ORDER — NICOTINE 14 MG/24HR TD PT24
14.0000 mg | MEDICATED_PATCH | Freq: Every day | TRANSDERMAL | Status: DC
  Administered 2017-04-20 – 2017-04-21 (×2): 14 mg via TRANSDERMAL
  Filled 2017-04-20 (×2): qty 1

## 2017-04-20 NOTE — ED Notes (Signed)
ED TO INPATIENT HANDOFF REPORT  Name/Age/Gender Cody Delacruz 52 y.o. male  Code Status    Code Status Orders  (From admission, onward)        Start     Ordered   04/19/17 2321  Full code  Continuous     04/19/17 2321    Code Status History    Date Active Date Inactive Code Status Order ID Comments User Context   11/18/2016 18:56 11/19/2016 17:46 Full Code [id number]  Caren Griffins, MD Inpatient   08/08/2016 02:24 08/09/2016 17:57 Full Code [id number]  Ivor Costa, MD ED      Home/SNF/Other Home  Chief Complaint rectal bleeding  Level of Care/Admitting Diagnosis ED Disposition    ED Disposition Condition Comment   Admit  Hospital Area: [facility] [[id number]]  Level of Care: Telemetry [5]  Admit to tele based on following criteria: Monitor for Ischemic changes  Diagnosis: Rectal bleeding [338250]  Admitting Physician: Rise Patience [phone]  Attending Physician: Rise Patience [phone]  PT Class (Do Not Modify): Observation [104]  PT Acc Code (Do Not Modify): Observation [10022]       Medical History Past Medical History:  Diagnosis Date  . Back pain   . Depression   . Neck pain   . Other acariasis    Hep C  . Rectal bleeding   . Seizures (HCC)     Allergies Allergies  Allergen Reactions  . Codeine Other (See Comments)    Unknown childhood allergy    IV Location/Drains/Wounds Patient Lines/Drains/Airways Status   Active Line/Drains/Airways    Name:   Placement date:   Placement time:   Site:   Days:   Peripheral IV 04/19/17 Left Antecubital   04/19/17    1848    Antecubital   1   Peripheral IV 04/19/17 Left Forearm   04/19/17    1933    Forearm   1          Labs/Imaging Results for orders placed or performed during the hospital encounter of 04/19/17 (from the past 48 hour(s))  Comprehensive metabolic panel     Status: Abnormal   Collection Time: 04/19/17  7:01 PM  Result Value Ref Range   Sodium 143 135 - 145  mmol/L   Potassium 3.8 3.5 - 5.1 mmol/L   Chloride 108 101 - 111 mmol/L   CO2 24 22 - 32 mmol/L   Glucose, Bld 84 65 - 99 mg/dL   BUN 12 6 - 20 mg/dL   Creatinine, Ser 0.70 0.61 - 1.24 mg/dL   Calcium 9.0 8.9 - 10.3 mg/dL   Total Protein 7.9 6.5 - 8.1 g/dL   Albumin 4.6 3.5 - 5.0 g/dL   AST 46 (H) 15 - 41 U/L   ALT 36 17 - 63 U/L   Alkaline Phosphatase 55 38 - 126 U/L   Total Bilirubin 0.3 0.3 - 1.2 mg/dL   GFR calc non Af Amer >60 >60 mL/min   GFR calc Af Amer >60 >60 mL/min    Comment: (NOTE) The eGFR has been calculated using the CKD EPI equation. This calculation has not been validated in all clinical situations. eGFR's persistently <60 mL/min signify possible Chronic Kidney Disease.    Anion gap 11 5 - 15  CBC     Status: None   Collection Time: 04/19/17  7:01 PM  Result Value Ref Range   WBC 7.0 4.0 - 10.5 K/uL   RBC 4.25 4.22 - 5.81 MIL/uL  Hemoglobin 14.0 13.0 - 17.0 g/dL   HCT 40.3 39.0 - 52.0 %   MCV 94.8 78.0 - 100.0 fL   MCH 32.9 26.0 - 34.0 pg   MCHC 34.7 30.0 - 36.0 g/dL   RDW 14.0 11.5 - 15.5 %   Platelets 213 150 - 400 K/uL  Type and screen [place]     Status: None   Collection Time: 04/19/17  7:01 PM  Result Value Ref Range   ABO/RH(D) O POS    Antibody Screen NEG    Sample Expiration 04/22/2017   Lipase, blood     Status: None   Collection Time: 04/19/17  7:01 PM  Result Value Ref Range   Lipase 42 11 - 51 U/L  POC occult blood, ED     Status: None   Collection Time: 04/19/17 10:48 PM  Result Value Ref Range   Fecal Occult Bld NEGATIVE NEGATIVE  CBC     Status: Abnormal   Collection Time: 04/19/17 11:22 PM  Result Value Ref Range   WBC 5.9 4.0 - 10.5 K/uL   RBC 4.12 (L) 4.22 - 5.81 MIL/uL   Hemoglobin 13.4 13.0 - 17.0 g/dL   HCT 39.8 39.0 - 52.0 %   MCV 96.6 78.0 - 100.0 fL   MCH 32.5 26.0 - 34.0 pg   MCHC 33.7 30.0 - 36.0 g/dL   RDW 14.0 11.5 - 15.5 %   Platelets 195 150 - 400 K/uL  Lactic acid, plasma     Status:  None   Collection Time: 04/19/17 11:29 PM  Result Value Ref Range   Lactic Acid, Venous 1.7 0.5 - 1.9 mmol/L  Basic metabolic panel     Status: Abnormal   Collection Time: 04/20/17  4:09 AM  Result Value Ref Range   Sodium 139 135 - 145 mmol/L   Potassium 3.7 3.5 - 5.1 mmol/L   Chloride 107 101 - 111 mmol/L   CO2 23 22 - 32 mmol/L   Glucose, Bld 76 65 - 99 mg/dL   BUN 10 6 - 20 mg/dL   Creatinine, Ser 0.69 0.61 - 1.24 mg/dL   Calcium 7.9 (L) 8.9 - 10.3 mg/dL   GFR calc non Af Amer >60 >60 mL/min   GFR calc Af Amer >60 >60 mL/min    Comment: (NOTE) The eGFR has been calculated using the CKD EPI equation. This calculation has not been validated in all clinical situations. eGFR's persistently <60 mL/min signify possible Chronic Kidney Disease.    Anion gap 9 5 - 15  CBC     Status: Abnormal   Collection Time: 04/20/17  4:09 AM  Result Value Ref Range   WBC 5.7 4.0 - 10.5 K/uL   RBC 3.89 (L) 4.22 - 5.81 MIL/uL   Hemoglobin 12.6 (L) 13.0 - 17.0 g/dL   HCT 37.0 (L) 39.0 - 52.0 %   MCV 95.1 78.0 - 100.0 fL   MCH 32.4 26.0 - 34.0 pg   MCHC 34.1 30.0 - 36.0 g/dL   RDW 14.0 11.5 - 15.5 %   Platelets 170 150 - 400 K/uL   Ct Abdomen Pelvis W Contrast  Result Date: 04/19/2017 CLINICAL DATA:  Blood from the rectum. EXAM: CT ABDOMEN AND PELVIS WITH CONTRAST TECHNIQUE: Multidetector CT imaging of the abdomen and pelvis was performed using the standard protocol following bolus administration of intravenous contrast. CONTRAST:  169m ISOVUE-300 IOPAMIDOL (ISOVUE-300) INJECTION 61% COMPARISON:  November 18, 2016 FINDINGS: Lower chest: No acute abnormality. Hepatobiliary: No  focal liver lesion is identified. There is diffuse low density of the liver. The gallbladder is normal. The biliary tree is normal. Pancreas: Unremarkable. No pancreatic ductal dilatation or surrounding inflammatory changes. Spleen: Normal in size without focal abnormality. Adrenals/Urinary Tract: Adrenal glands are  unremarkable. Kidneys are normal, without renal calculi, focal lesion, or hydronephrosis. Bladder is unremarkable. Stomach/Bowel: There is mild diffuse bowel wall thickening the distal descending colon, sigmoid colon and rectum. There is no small bowel obstruction. The appendix is normal. The stomach is normal. Vascular/Lymphatic: Aortic atherosclerosis. No enlarged abdominal or pelvic lymph nodes. Reproductive: Prostate is unremarkable. Other: None. Musculoskeletal: Degenerative joint changes of the lower lumbar spine are identified. IMPRESSION: Mild diffuse bowel wall thickening of the distal descending colon, sigmoid colon and rectum suggesting colitis. There is no bowel obstruction. Electronically Signed   By: Abelardo Diesel M.D.   On: 04/19/2017 21:24    Pending Labs Unresulted Labs (From admission, onward)   Start     Ordered   04/19/17 2322  CBC  Now then every 4 hours,   R     04/19/17 2321      Vitals/Pain Today's Vitals   04/20/17 0140 04/20/17 0358 04/20/17 0500 04/20/17 0600  BP: 112/80 119/82 112/74 132/84  Pulse: 81 87 76 82  Resp: '14 17 14 14  ' Temp:      TempSrc:      SpO2: 94% 97% 97% 95%  Weight:      Height:      PainSc:        Isolation Precautions No active isolations  Medications Medications  0.9 %  sodium chloride infusion ( Intravenous New Bag/Given 04/20/17 0416)  divalproex (DEPAKOTE ER) 24 hr tablet 500 mg (not administered)  OLANZapine (ZYPREXA) tablet 10 mg (not administered)  pantoprazole (PROTONIX) EC tablet 40 mg (not administered)  pravastatin (PRAVACHOL) tablet 40 mg (not administered)  traMADol (ULTRAM) tablet 50 mg (not administered)  traZODone (DESYREL) tablet 50 mg (50 mg Oral Given 04/19/17 2354)  acetaminophen (TYLENOL) tablet 650 mg (not administered)    Or  acetaminophen (TYLENOL) suppository 650 mg (not administered)  ondansetron (ZOFRAN) tablet 4 mg (not administered)    Or  ondansetron (ZOFRAN) injection 4 mg (not administered)   metroNIDAZOLE (FLAGYL) IVPB 500 mg (500 mg Intravenous New Bag/Given 04/20/17 0648)  ciprofloxacin (CIPRO) IVPB 400 mg (0 mg Intravenous Stopped 04/20/17 0055)  LORazepam (ATIVAN) tablet 1 mg (not administered)    Or  LORazepam (ATIVAN) injection 1 mg (not administered)  thiamine (VITAMIN B-1) tablet 100 mg (not administered)    Or  thiamine (B-1) injection 100 mg (not administered)  folic acid (FOLVITE) tablet 1 mg (not administered)  multivitamin with minerals tablet 1 tablet (not administered)  iopamidol (ISOVUE-300) 61 % injection (100 mLs  Contrast Given 04/19/17 2101)  divalproex (DEPAKOTE ER) 24 hr tablet 500 mg (500 mg Oral Given 04/19/17 2355)    Mobility walks

## 2017-04-20 NOTE — Plan of Care (Signed)
  Nutrition: Adequate nutrition will be maintained 04/20/2017 1348 - Progressing by Dorene Sorrow, RN   Fluid Volume: Will show no signs and symptoms of excessive bleeding 04/20/2017 1348 - Progressing by Dorene Sorrow, RN   Pain Managment: General experience of comfort will improve 04/20/2017 1348 - Progressing by Dorene Sorrow, RN

## 2017-04-20 NOTE — Progress Notes (Signed)
Pharmacy Antibiotic Note  Isam Unrein is a 52 y.o. male admitted on 04/19/2017 with Intra-abdominal Infection.  Pharmacy has been consulted for Ciprofloxacin dosing.  Plan: Ciprofloxacin 400mg  iv q12hr  Height: 5\' 9"  (175.3 cm) Weight: 180 lb 5 oz (81.8 kg) IBW/kg (Calculated) : 70.7  Temp (24hrs), Avg:98.1 F (36.7 C), Min:98.1 F (36.7 C), Max:98.1 F (36.7 C)  Recent Labs  Lab 04/19/17 1901 04/19/17 2322 04/19/17 2329 04/20/17 0409  WBC 7.0 5.9  --  5.7  CREATININE 0.70  --   --  0.69  LATICACIDVEN  --   --  1.7  --     Estimated Creatinine Clearance: 106.8 mL/min (by C-G formula based on SCr of 0.69 mg/dL).    Allergies  Allergen Reactions  . Codeine Other (See Comments)    Unknown childhood allergy    Antimicrobials this admission: Ciprofloxacin 04/20/2017 >> Flagyl 04/20/2017 >>  Dose adjustments this admission: -  Microbiology results: pending  Thank you for allowing pharmacy to be a part of this patient's care.  Nani Skillern Crowford 04/20/2017 5:02 AM

## 2017-04-20 NOTE — ED Notes (Signed)
Pt resting in bed with eyes closed. Respirations even and unlabored. Pt denies pain or other complaints at this time.

## 2017-04-20 NOTE — Progress Notes (Signed)
Patient Demographics:    Cody Delacruz, is a 52 y.o. male, DOB - [birth year], JIR:[id number]  Admit date - 04/19/2017   Admitting Physician Rise Patience, MD  Outpatient Primary MD for the patient is Tresa Garter, MD  LOS - 0   Chief Complaint  Patient presents with  . Rectal Bleeding  . Back Pain  . Headache        Subjective:    Yandell Mcjunkins today has no fevers, no emesis,  No chest pain, further bright regular rhythm, no further abdominal pain, patient wants to eat,  Assessment  & Plan :    Principal Problem:   Rectal bleeding Active Problems:   Seizure disorder (HCC)   Alcohol abuse   Colitis   GI bleed  Brief Summary:-  52 y.o. male with history of alcohol abuse, depression, seizures presents to the ER with complaints of rectal bleeding.  Patient states he has been having chronic rectal bleeding but today it was persistent.  Patient had multiple episodes of rectal bleeding today.  Patient states he has had 2 colonoscopies in the last 3 years.  Which showed polyps.  Is being followed at [place] to drinking alcohol every day.  Denies any abdominal pain nausea vomiting.   1)Gi Bleed- Hgb is > 12, c/n to monitor, transfuse as clinically indicated consider GI/surgical consult if H&H drops significantly or if further bright red blood per rectum.  Bleeding may be hemorrhoidal  2)Colitis-continue Cipro and Flagyl , upon discharge patient may have to be discharged on Augmentin as Flagyl and alcohol combination will be tricky, try liquid diet  3)Etoh Abuse-benzo for DT prophylaxis, thiamine and folic acid as ordered  4)H/o SZ-continue Depakote, patient at risk for seizures given heavy alcohol use and risk for alcohol withdrawal, benzos as above #3  5)Depression-continue depression trazodone  Code Status : Full code  Disposition Plan  : home  Consults  :  None  yet  DVT Prophylaxis  : SCDs   Lab Results  Component Value Date   PLT 170 04/20/2017   Inpatient Medications  Scheduled Meds: . diazepam  2 mg Oral TID  . divalproex  500 mg Oral TID  . folic acid  1 mg Oral Daily  . multivitamin with minerals  1 tablet Oral Daily  . nicotine  14 mg Transdermal Daily  . OLANZapine  10 mg Oral Daily  . pantoprazole  40 mg Oral BID AC  . pravastatin  40 mg Oral Daily  . thiamine  100 mg Oral Daily  . traZODone  50 mg Oral QHS   Continuous Infusions: . sodium chloride 100 mL/hr at 04/20/17 1558  . ciprofloxacin Stopped (04/20/17 1007)  . metronidazole Stopped (04/20/17 1427)   PRN Meds:.acetaminophen **OR** acetaminophen, LORazepam **OR** LORazepam, ondansetron **OR** ondansetron (ZOFRAN) IV   Anti-infectives (From admission, onward)   Start     Dose/Rate Route Frequency Ordered Stop   04/19/17 2345  ciprofloxacin (CIPRO) IVPB 400 mg     400 mg 200 mL/hr over 60 Minutes Intravenous 2 times daily 04/19/17 2342     04/19/17 2330  metroNIDAZOLE (FLAGYL) IVPB 500 mg     500 mg 100 mL/hr over 60 Minutes Intravenous Every 8 hours 04/19/17 2328  Objective:   Vitals:   04/20/17 0600 04/20/17 0757 04/20/17 1300 04/20/17 1800  BP: 132/84 122/83 112/65 129/84  Pulse: 82 82 96 77  Resp: 14 16 16 16   Temp:  98.3 F (36.8 C)    TempSrc:  Oral    SpO2: 95% 94% 98%   Weight:      Height:        Wt Readings from Last 3 Encounters:  04/19/17 81.8 kg (180 lb 5 oz)  11/18/16 81.4 kg (179 lb 8 oz)  09/01/16 80.7 kg (178 lb)     Intake/Output Summary (Last 24 hours) at 04/20/2017 1817 Last data filed at 04/20/2017 1724 Gross per 24 hour  Intake 1160 ml  Output 1400 ml  Net -240 ml     Physical Exam  Gen:- Awake Alert,  In no apparent distress  HEENT:- [place].AT, No sclera icterus Neck-Supple Neck,No JVD,.  Lungs-  CTAB  CV- S1, S2 normal Abd-  +ve B.Sounds, Abd Soft, mild periumbilical abdominal tenderness without rebound and  guarding Extremity/Skin:- No  edema,    Psych-appropriate affect   Data Review:   Micro Results No results found for this or any previous visit (from the past 240 hour(s)).  Radiology Reports Ct Abdomen Pelvis W Contrast  Result Date: 04/19/2017 CLINICAL DATA:  Blood from the rectum. EXAM: CT ABDOMEN AND PELVIS WITH CONTRAST TECHNIQUE: Multidetector CT imaging of the abdomen and pelvis was performed using the standard protocol following bolus administration of intravenous contrast. CONTRAST:  185mL ISOVUE-300 IOPAMIDOL (ISOVUE-300) INJECTION 61% COMPARISON:  November 18, 2016 FINDINGS: Lower chest: No acute abnormality. Hepatobiliary: No focal liver lesion is identified. There is diffuse low density of the liver. The gallbladder is normal. The biliary tree is normal. Pancreas: Unremarkable. No pancreatic ductal dilatation or surrounding inflammatory changes. Spleen: Normal in size without focal abnormality. Adrenals/Urinary Tract: Adrenal glands are unremarkable. Kidneys are normal, without renal calculi, focal lesion, or hydronephrosis. Bladder is unremarkable. Stomach/Bowel: There is mild diffuse bowel wall thickening the distal descending colon, sigmoid colon and rectum. There is no small bowel obstruction. The appendix is normal. The stomach is normal. Vascular/Lymphatic: Aortic atherosclerosis. No enlarged abdominal or pelvic lymph nodes. Reproductive: Prostate is unremarkable. Other: None. Musculoskeletal: Degenerative joint changes of the lower lumbar spine are identified. IMPRESSION: Mild diffuse bowel wall thickening of the distal descending colon, sigmoid colon and rectum suggesting colitis. There is no bowel obstruction. Electronically Signed   By: Abelardo Diesel M.D.   On: 04/19/2017 21:24     CBC Recent Labs  Lab 04/19/17 1901 04/19/17 2322 04/20/17 0409 04/20/17 1447  WBC 7.0 5.9 5.7  --   HGB 14.0 13.4 12.6* 12.5*  HCT 40.3 39.8 37.0* 36.4*  PLT 213 195 170  --   MCV 94.8 96.6  95.1  --   MCH 32.9 32.5 32.4  --   MCHC 34.7 33.7 34.1  --   RDW 14.0 14.0 14.0  --     Chemistries  Recent Labs  Lab 04/19/17 1901 04/20/17 0409  NA 143 139  K 3.8 3.7  CL 108 107  CO2 24 23  GLUCOSE 84 76  BUN 12 10  CREATININE 0.70 0.69  CALCIUM 9.0 7.9*  AST 46*  --   ALT 36  --   ALKPHOS 55  --   BILITOT 0.3  --    ------------------------------------------------------------------------------------------------------------------ No results for input(s): CHOL, HDL, LDLCALC, TRIG, CHOLHDL, LDLDIRECT in the last 72 hours.  No results found for: HGBA1C ------------------------------------------------------------------------------------------------------------------  No results for input(s): TSH, T4TOTAL, T3FREE, THYROIDAB in the last 72 hours.  Invalid input(s): FREET3 ------------------------------------------------------------------------------------------------------------------ No results for input(s): VITAMINB12, FOLATE, FERRITIN, TIBC, IRON, RETICCTPCT in the last 72 hours.  Coagulation profile No results for input(s): INR, PROTIME in the last 168 hours.  No results for input(s): DDIMER in the last 72 hours.  Cardiac Enzymes No results for input(s): CKMB, TROPONINI, MYOGLOBIN in the last 168 hours.  Invalid input(s): CK ------------------------------------------------------------------------------------------------------------------ No results found for: BNP   Roxan Hockey M.D on 04/20/2017 at 6:17 PM  Between 7am to 7pm - Pager - [phone]  After 7pm go to [email or web address] - password [email or web address]  [facility] -  Office  [phone]   Voice Recognition Viviann Spare dictation system was used to create this note, attempts have been made to correct errors. Please contact the author with questions and/or clarifications.

## 2017-04-21 LAB — CBC
HCT: 37.2 % — ABNORMAL LOW (ref 39.0–52.0)
Hemoglobin: 12.5 g/dL — ABNORMAL LOW (ref 13.0–17.0)
MCH: 32.4 pg (ref 26.0–34.0)
MCHC: 33.6 g/dL (ref 30.0–36.0)
MCV: 96.4 fL (ref 78.0–100.0)
PLATELETS: 164 10*3/uL (ref 150–400)
RBC: 3.86 MIL/uL — ABNORMAL LOW (ref 4.22–5.81)
RDW: 13.5 % (ref 11.5–15.5)
WBC: 4.1 10*3/uL (ref 4.0–10.5)

## 2017-04-21 LAB — BASIC METABOLIC PANEL
ANION GAP: 6 (ref 5–15)
BUN: 7 mg/dL (ref 6–20)
CALCIUM: 7.9 mg/dL — AB (ref 8.9–10.3)
CO2: 24 mmol/L (ref 22–32)
Chloride: 107 mmol/L (ref 101–111)
Creatinine, Ser: 0.84 mg/dL (ref 0.61–1.24)
GFR calc Af Amer: >60 mL/min (ref >60)
GLUCOSE: 113 mg/dL — AB (ref 65–99)
Potassium: 3.7 mmol/L (ref 3.5–5.1)
SODIUM: 137 mmol/L (ref 135–145)

## 2017-04-21 MED ORDER — THIAMINE HCL 100 MG PO TABS: 100.0000 mg | ORAL_TABLET | Freq: Every day | ORAL | 1 refills | Status: DC

## 2017-04-21 MED ORDER — OMEPRAZOLE 20 MG PO CPDR: 40.0000 mg | DELAYED_RELEASE_CAPSULE | Freq: Every day | ORAL | 2 refills | Status: DC

## 2017-04-21 MED ORDER — OLANZAPINE 10 MG PO TABS: 10.0000 mg | ORAL_TABLET | Freq: Every day | ORAL | 1 refills | Status: DC

## 2017-04-21 MED ORDER — FOLIC ACID 1 MG PO TABS: 1.0000 mg | ORAL_TABLET | Freq: Every day | ORAL | 1 refills | Status: DC

## 2017-04-21 MED ORDER — DIVALPROEX SODIUM ER 500 MG PO TB24: 500.0000 mg | ORAL_TABLET | Freq: Three times a day (TID) | ORAL | 1 refills | Status: DC

## 2017-04-21 MED ORDER — CIPROFLOXACIN HCL 500 MG PO TABS
500.0000 mg | ORAL_TABLET | Freq: Two times a day (BID) | ORAL | Status: DC
  Administered 2017-04-21: 500 mg via ORAL
  Filled 2017-04-21: qty 1

## 2017-04-21 MED ORDER — METRONIDAZOLE 500 MG PO TABS
500.0000 mg | ORAL_TABLET | Freq: Once | ORAL | Status: AC
  Administered 2017-04-21: 500 mg via ORAL
  Filled 2017-04-21: qty 1

## 2017-04-21 MED ORDER — TRAZODONE HCL 100 MG PO TABS: 100.0000 mg | ORAL_TABLET | Freq: Every day | ORAL | 3 refills | Status: DC

## 2017-04-21 MED ORDER — DULOXETINE HCL 30 MG PO CPEP: 30.0000 mg | ORAL_CAPSULE | Freq: Every day | ORAL | 1 refills | Status: DC

## 2017-04-21 MED ORDER — ONDANSETRON HCL 4 MG PO TABS: 4.0000 mg | ORAL_TABLET | ORAL | 0 refills | Status: DC | PRN

## 2017-04-21 MED ORDER — ADULT MULTIVITAMIN W/MINERALS CH: 1.0000 | ORAL_TABLET | Freq: Every day | ORAL | 0 refills | Status: DC

## 2017-04-21 NOTE — Discharge Instructions (Signed)
1)Quit smoking 2) quit drinking alcohol 3) call if further abdominal pain, fevers or blood in the stool 4) drink plenty liquids 5)Avoid ibuprofen/Advil/Aleve/Motrin/Goody Powders/Naproxen/BC powders as these will make you more likely to bleed and can cause stomach ulcers

## 2017-04-21 NOTE — Progress Notes (Signed)
04/21/17  1235  Reviewed discharge instructions with patient. Patient verbalized understanding of discharge instructions. Copy of discharge instructions and prescriptions given to patient.

## 2017-04-21 NOTE — Plan of Care (Signed)
  Nutrition: Adequate nutrition will be maintained 04/21/2017 1003 - Progressing by Dorene Sorrow, RN   Bowel/Gastric: Will show no signs and symptoms of gastrointestinal bleeding 04/21/2017 1003 - Progressing by Dorene Sorrow, RN   Activity: Risk for activity intolerance will decrease 04/21/2017 1003 - Progressing by Dorene Sorrow, RN

## 2017-04-21 NOTE — Discharge Summary (Addendum)
Cody Delacruz, is a 52 y.o. male  DOB 13-Nov-1965  MRN [id number].  Admission date:  04/19/2017  Admitting Physician  Rise Patience, MD  Discharge Date:  04/21/2017   Primary MD  Tresa Garter, MD  Recommendations for primary care physician for things to follow:   1)Quit smoking 2) quit drinking alcohol 3) call if further abdominal pain, fevers or blood in the stool 4) drink plenty liquids 5)Avoid ibuprofen/Advil/Aleve/Motrin/Goody Powders/Naproxen/BC powders as these will make you more likely to bleed and can cause stomach ulcers   Admission Diagnosis  Colitis [K52.9] Lower GI bleed [K92.2] Rectal bleeding [K62.5]   Discharge Diagnosis  Colitis [K52.9] Lower GI bleed [K92.2] Rectal bleeding [K62.5]    Principal Problem:   Rectal bleeding Active Problems:   Seizure disorder ([place])   Alcohol abuse   Colitis   GI bleed      Past Medical History:  Diagnosis Date  . Back pain   . Depression   . Neck pain   . Other acariasis    Hep C  . Rectal bleeding   . Seizures ([place])     Past Surgical History:  Procedure Laterality Date  . HERNIA REPAIR    . JOINT REPLACEMENT         HPI  from the history and physical done on the day of admission:    Chief Complaint: Rectal bleeding.  HPI: Cody Delacruz is a 52 y.o. male with history of alcohol abuse, depression, seizures presents to the ER with complaints of rectal bleeding.  Patient states he has been having chronic rectal bleeding but today it was persistent.  Patient had multiple episodes of rectal bleeding today.  Patient states he has had 2 colonoscopies in the last 3 years.  Which showed polyps.  Is being followed at [place] to drinking alcohol every day.  Denies any abdominal pain nausea vomiting.  ED Course: In the ER patient remains hemodynamically stable hemoglobin is around 14.  Since patient states he has  had multiple episodes patient admitted for further observation.  Patient denies taking any NSAIDs or aspirin.  Patient CT scan was done since patient had some abdominal discomfort which was periumbilical.  CT scan shows colitis.  Patient is afebrile and has no leukocytosis.  Denies using any antibiotics recently.  Denies any diarrhea.   Hospital Course:    Brief Summary:- 52 y.o.malewithhistory of alcohol abuse, depression, seizures presents to the ER with complaints of rectal bleeding. Patient states he has been having chronic rectal bleeding but today it was persistent. Patient had multiple episodes of rectal bleeding today. Patient states he has had 2 colonoscopies in the last 3 years. Which showed polyps. Is being followed at [place] to drinking alcohol every day. Denies any abdominal pain nausea vomiting.   Plan:- 1)Gi Bleed- Hgb is > 12,  No further bright red blood per rectum.  Bleeding may be hemorrhoidal, patient remains hemodynamically stable without abdominal pain.   2)Possible Colitis-no fevers, no leukocytosis, no further abdominal  pain, initially patient was treated with Cipro and Flagyl ,  avoid Flagyl upon discharge as the combination of alcohol and Flagyl will be tricky.  Tolerating regular diet well, no further indications for antibiotics at this time.  Patient with no further BMs,  3)Etoh Abuse-patient received benzo for DT prophylaxis, discharged on thiamine and folic acid .  No evidence of DTs at this time, alcohol cessation strongly advised  4)H/o SZ-continue Depakote, patient at risk for seizures given heavy alcohol use and risk for alcohol withdrawal  5)Depression-stable, continue depression trazodone   Patient met inpatient criteria on admission, however patient's colitis improved and resolved quicker than anticipated.  Patient requesting discharge home since no further GI bleeding and  hemodynamically stable and tolerating oral intake well , we  will discharge home   Discharge Condition: [place]  Follow UP-PCP 1-2 weeks for repeat CBC    Diet and Activity recommendation:  As advised  Discharge Instructions     Discharge Instructions    Call MD for:   Complete by:  As directed    Call MD for:  difficulty breathing, headache or visual disturbances   Complete by:  As directed    Call MD for:  persistant dizziness or light-headedness   Complete by:  As directed    Call MD for:  persistant nausea and vomiting   Complete by:  As directed    Call MD for:  severe uncontrolled pain   Complete by:  As directed    Call MD for:  temperature >100.4   Complete by:  As directed    Diet - low sodium heart healthy   Complete by:  As directed    Discharge instructions   Complete by:  As directed    1)Quit smoking 2) quit drinking alcohol 3) call if further abdominal pain, fevers or blood in the stool 4) drink plenty liquids 5)Avoid ibuprofen/Advil/Aleve/Motrin/Goody Powders/Naproxen/BC powders as these will make you more likely to bleed and can cause stomach ulcers   Increase activity slowly   Complete by:  As directed         Discharge Medications     Allergies as of 04/21/2017      Reactions   Codeine Other (See Comments)   Unknown childhood allergy      Medication List    STOP taking these medications   traMADol 50 MG tablet Commonly known as:  ULTRAM     TAKE these medications   acetaminophen-codeine 300-30 MG tablet Commonly known as:  TYLENOL #3 TAKE 1 TABLET BY MOUTH EVERY 6 HOURS AS NEEDED FOR MODERATE PAIN   divalproex 500 MG 24 hr tablet Commonly known as:  DEPAKOTE ER Take 1 tablet (500 mg total) by mouth 3 (three) times daily.   DULoxetine 30 MG capsule Commonly known as:  CYMBALTA Take 1 capsule (30 mg total) by mouth daily.   folic acid 1 MG tablet Commonly known as:  FOLVITE Take 1 tablet (1 mg total) by mouth daily. Start taking on:  04/22/2017   hydrocortisone 25 MG suppository Commonly  known as:  ANUSOL-HC Place 1 suppository (25 mg total) rectally 2 (two) times daily.   multivitamin with minerals Tabs tablet Take 1 tablet by mouth daily. Start taking on:  04/22/2017   nicotine 21 mg/24hr patch Commonly known as:  NICODERM CQ - dosed in mg/24 hours Place 1 patch (21 mg total) onto the skin daily.   OLANZapine 10 MG tablet Commonly known as:  ZYPREXA Take 1 tablet (10  mg total) by mouth daily.   omeprazole 20 MG capsule Commonly known as:  PRILOSEC Take 2 capsules (40 mg total) by mouth daily.   ondansetron 4 MG tablet Commonly known as:  ZOFRAN Take 1 tablet (4 mg total) by mouth every 4 (four) hours as needed for nausea.   pravastatin 40 MG tablet Commonly known as:  PRAVACHOL Take 1 tablet (40 mg total) by mouth daily.   thiamine 100 MG tablet Take 1 tablet (100 mg total) by mouth daily. Start taking on:  04/22/2017   traZODone 100 MG tablet Commonly known as:  DESYREL Take 1 tablet (100 mg total) by mouth at bedtime. What changed:  how much to take       Major procedures and Radiology Reports - PLEASE review detailed and final reports for all details, in brief -   Ct Abdomen Pelvis W Contrast  Result Date: 04/19/2017 CLINICAL DATA:  Blood from the rectum. EXAM: CT ABDOMEN AND PELVIS WITH CONTRAST TECHNIQUE: Multidetector CT imaging of the abdomen and pelvis was performed using the standard protocol following bolus administration of intravenous contrast. CONTRAST:  136m ISOVUE-300 IOPAMIDOL (ISOVUE-300) INJECTION 61% COMPARISON:  November 18, 2016 FINDINGS: Lower chest: No acute abnormality. Hepatobiliary: No focal liver lesion is identified. There is diffuse low density of the liver. The gallbladder is normal. The biliary tree is normal. Pancreas: Unremarkable. No pancreatic ductal dilatation or surrounding inflammatory changes. Spleen: Normal in size without focal abnormality. Adrenals/Urinary Tract: Adrenal glands are unremarkable. Kidneys are normal,  without renal calculi, focal lesion, or hydronephrosis. Bladder is unremarkable. Stomach/Bowel: There is mild diffuse bowel wall thickening the distal descending colon, sigmoid colon and rectum. There is no small bowel obstruction. The appendix is normal. The stomach is normal. Vascular/Lymphatic: Aortic atherosclerosis. No enlarged abdominal or pelvic lymph nodes. Reproductive: Prostate is unremarkable. Other: None. Musculoskeletal: Degenerative joint changes of the lower lumbar spine are identified. IMPRESSION: Mild diffuse bowel wall thickening of the distal descending colon, sigmoid colon and rectum suggesting colitis. There is no bowel obstruction. Electronically Signed   By: WAbelardo DieselM.D.   On: 04/19/2017 21:24    Micro Results   No results found for this or any previous visit (from the past 240 hour(s)).     Today   Subjective    Cody Delacruz has no new complaints, no nausea no vomiting, no fever no chills, no abdominal pain, no further BM, tolerating solid food well, eager to go home          Patient has been seen and examined prior to discharge   Objective   Blood pressure 105/76, pulse 71, temperature 98.9 F (37.2 C), temperature source Oral, resp. rate 16, height _0  (1.753 m), weight 81.8 kg (180 lb 5 oz), SpO2 98 %.   Intake/Output Summary (Last 24 hours) at 04/21/2017 1153 Last data filed at 04/21/2017 0900 Gross per 24 hour  Intake 2880 ml  Output 3675 ml  Net -795 ml    Exam Gen:- Awake  In no apparent distress  HEENT:- [place].AT,   Neck-Supple Neck,No JVD,  Lungs- mostly clear  CV- S1, S2 normal Abd-  +ve B.Sounds, Abd Soft, No tenderness,    Extremity/Skin:- Intact peripheral pulses   Psych-affect is appropriate Neuro-no tremors no new focal deficits   Data Review   CBC w Diff:  Lab Results  Component Value Date   WBC 4.1 04/21/2017   HGB 12.5 (L) 04/21/2017   HCT 37.2 (L) 04/21/2017   PLT  164 04/21/2017   LYMPHOPCT 50 08/07/2016    MONOPCT 10 08/07/2016   EOSPCT 4 08/07/2016   BASOPCT 1 08/07/2016    CMP:  Lab Results  Component Value Date   NA 137 04/21/2017   K 3.7 04/21/2017   CL 107 04/21/2017   CO2 24 04/21/2017   BUN 7 04/21/2017   CREATININE 0.84 04/21/2017   CREATININE 0.86 05/19/2016   PROT 7.9 04/19/2017   ALBUMIN 4.6 04/19/2017   BILITOT 0.3 04/19/2017   ALKPHOS 55 04/19/2017   AST 46 (H) 04/19/2017   ALT 36 04/19/2017  .   Total Discharge time is about 33 minutes  Roxan Hockey M.D on 04/21/2017 at 11:53 AM  [facility]   Office  [phone]  Voice Recognition Viviann [employer]are dictation system was used to create this note, attempts have been made to correct errors. Please contact the author with questions and/or clarifications.

## 2017-06-10 ENCOUNTER — Encounter: Payer: Self-pay | Admitting: Gastroenterology

## 2017-07-29 ENCOUNTER — Ambulatory Visit: Payer: Medicaid Other | Admitting: Gastroenterology

## 2017-08-12 ENCOUNTER — Other Ambulatory Visit: Payer: Self-pay | Admitting: Adult Health

## 2017-08-12 ENCOUNTER — Ambulatory Visit
Admission: RE | Admit: 2017-08-12 | Discharge: 2017-08-12 | Disposition: A | Discharge status: Home / Self Care | Payer: Medicaid Other | Source: Ambulatory Visit | Attending: Adult Health | Admitting: Adult Health

## 2017-08-12 DIAGNOSIS — R52 Pain, unspecified: Secondary | ICD-10-CM

## 2017-08-12 DIAGNOSIS — R200 Anesthesia of skin: Secondary | ICD-10-CM

## 2017-08-18 ENCOUNTER — Emergency Department (HOSPITAL_COMMUNITY): Payer: Medicaid Other

## 2017-08-18 ENCOUNTER — Emergency Department (HOSPITAL_COMMUNITY)
Admission: EM | Admit: 2017-08-18 | Discharge: 2017-08-18 | Disposition: A | Discharge status: Home / Self Care | Payer: Medicaid Other | Attending: Emergency Medicine | Admitting: Emergency Medicine

## 2017-08-18 ENCOUNTER — Encounter (HOSPITAL_COMMUNITY): Payer: Self-pay | Admitting: *Deleted

## 2017-08-18 DIAGNOSIS — R202 Paresthesia of skin: Secondary | ICD-10-CM | POA: Insufficient documentation

## 2017-08-18 DIAGNOSIS — Z79899 Other long term (current) drug therapy: Secondary | ICD-10-CM | POA: Insufficient documentation

## 2017-08-18 DIAGNOSIS — M6281 Muscle weakness (generalized): Secondary | ICD-10-CM | POA: Diagnosis not present

## 2017-08-18 DIAGNOSIS — F17210 Nicotine dependence, cigarettes, uncomplicated: Secondary | ICD-10-CM | POA: Diagnosis not present

## 2017-08-18 DIAGNOSIS — M21372 Foot drop, left foot: Secondary | ICD-10-CM | POA: Insufficient documentation

## 2017-08-18 DIAGNOSIS — M21371 Foot drop, right foot: Secondary | ICD-10-CM

## 2017-08-18 DIAGNOSIS — R531 Weakness: Secondary | ICD-10-CM

## 2017-08-18 LAB — TSH: TSH: 1.159 u[IU]/mL (ref 0.350–4.500)

## 2017-08-18 LAB — VITAMIN B12: VITAMIN B 12: 498 pg/mL (ref 180–914)

## 2017-08-18 NOTE — ED Triage Notes (Signed)
Pt complains of bilateral leg numbness and weakness x 4 days. Pt went to his PCP, had x-ray performed, which were normal. Pt has walked short distances w/ can but states greater difficulty walking.

## 2017-08-18 NOTE — ED Provider Notes (Signed)
[facility] DEPT Provider Note   CSN: [id number] Arrival date & time: 08/18/17  1147     History   Chief Complaint Chief Complaint  Patient presents with  . Extremity Weakness    bilateral leg    HPI Hezzie Karim is a 52 y.o. male.  HPI Patient is a 52 year old male with history of alcohol abuse, depression/anxiety, seizures, chronic low back pain (uses wheelchair at all times) who presents with bilateral lower extremity paresthesias and weakness. At baseline, he uses wheelchair for any distances outside of his house. He says this is due to severe back/disc disease. He has had one week of paresthesias from knees down and weakness with dorsiflexion. He has had some falls, but denies any injuries to his legs. Denies any fevers, chills, bowel/bladder incontinence. He reports stable chronic back pain. Was seen by PCP one week ago for symptoms, had lumbar series performed which showed mild degenerative disc disease at L3-4.   Past Medical History:  Diagnosis Date  . Anxiety   . Back pain   . Depression   . Epilepsy, unspecified, not intractable, without status epilepticus ([place])   . GERD (gastroesophageal reflux disease)   . Hemorrhoids   . Hypercholesteremia 03/2017   210  . Neck pain   . Other acariasis    Hep C  . PTSD (post-traumatic stress disorder)   . Rectal bleeding   . Seizures [facility])     Patient Active Problem List   Diagnosis Date Noted  . GI bleed 04/20/2017  . Rectal bleeding 04/19/2017  . Colitis 04/19/2017  . Rectal bleed 11/18/2016  . Dental abscess 09/01/2016  . Moderate episode of recurrent major depressive disorder ([place]) 09/01/2016  . Smoking addiction 09/01/2016  . Depression 08/08/2016  . Tobacco abuse 08/08/2016  . Alcohol abuse 08/08/2016  . Seizure ([place]) 08/08/2016  . Alcoholic intoxication with complication ([place])   . Numbness and tingling in both hands 01/22/2016  . Chronic pain syndrome 01/22/2016  .  Assistance needed for mobility 01/22/2016  . Healthcare maintenance 03/10/2015  . Dyslipidemia 08/26/2014  . Bilateral low back pain with left-sided sciatica 02/21/2014  . GERD (gastroesophageal reflux disease) 05/01/2013  . Seizure disorder ([place]) 05/01/2013  . Neck pain, chronic 10/31/2012  . Chronic bilateral low back pain with left-sided sciatica 10/31/2012    Past Surgical History:  Procedure Laterality Date  . HERNIA REPAIR    . JOINT REPLACEMENT          Home Medications    Prior to Admission medications   Medication Sig Start Date End Date Taking? Authorizing Provider  divalproex (DEPAKOTE ER) 500 MG 24 hr tablet Take 1 tablet (500 mg total) by mouth 3 (three) times daily. 04/21/17  Yes Emokpae, Courage, MD  DULoxetine (CYMBALTA) 30 MG capsule Take 1 capsule (30 mg total) by mouth daily. 04/21/17  Yes Emokpae, Courage, MD  lisinopril (PRINIVIL,ZESTRIL) 5 MG tablet TK 1 T PO QD 07/04/17  Yes [provider]  LORazepam (ATIVAN) 0.5 MG tablet TAKE 1 TABLET BY MOUTH DAILY 07/28/17  Yes [provider]  omeprazole (PRILOSEC) 20 MG capsule Take 2 capsules (40 mg total) by mouth daily. 04/21/17  Yes Emokpae, Courage, MD  pravastatin (PRAVACHOL) 40 MG tablet Take 1 tablet (40 mg total) by mouth daily. 05/19/16  Yes Tresa Garter, MD  traMADol (ULTRAM) 50 MG tablet TAKE 1 TABLET BY MOUTH BID 07/29/17  Yes [provider]  acetaminophen-codeine (TYLENOL #3) 300-30 MG tablet TAKE  1 TABLET BY MOUTH EVERY 6 HOURS AS NEEDED FOR MODERATE PAIN Patient not taking: Reported on 04/19/2017 11/05/16   Tresa Garter, MD  folic acid (FOLVITE) 1 MG tablet Take 1 tablet (1 mg total) by mouth daily. Patient not taking: Reported on 08/18/2017 04/22/17   Roxan Hockey, MD  hydrocortisone (ANUSOL-HC) 25 MG suppository Place 1 suppository (25 mg total) rectally 2 (two) times daily. Patient not taking: Reported on 04/19/2017 11/19/16   Dessa Phi, DO  Multiple Vitamin  (MULTIVITAMIN WITH MINERALS) TABS tablet Take 1 tablet by mouth daily. Patient not taking: Reported on 08/18/2017 04/22/17   Roxan Hockey, MD  nicotine (NICODERM CQ - DOSED IN MG/24 HOURS) 21 mg/24hr patch Place 1 patch (21 mg total) onto the skin daily. Patient not taking: Reported on 04/19/2017 09/19/16   Tresa Garter, MD  OLANZapine (ZYPREXA) 10 MG tablet Take 1 tablet (10 mg total) by mouth daily. Patient not taking: Reported on 08/18/2017 04/21/17   Roxan Hockey, MD  ondansetron (ZOFRAN) 4 MG tablet Take 1 tablet (4 mg total) by mouth every 4 (four) hours as needed for nausea. Patient not taking: Reported on 08/18/2017 04/21/17   Roxan Hockey, MD  thiamine 100 MG tablet Take 1 tablet (100 mg total) by mouth daily. Patient not taking: Reported on 08/18/2017 04/22/17   Roxan Hockey, MD  traZODone (DESYREL) 100 MG tablet Take 1 tablet (100 mg total) by mouth at bedtime. Patient taking differently: Take 100 mg by mouth at bedtime as needed for sleep.  04/21/17   Roxan Hockey, MD    Family History Family History  Problem Relation Age of Onset  . Cancer Mother 1       myosarcoma  . Lung cancer Father   . Emphysema Father   . Leukemia Sister 63  . Lung cancer Brother 105  . Cancer Maternal Grandmother     Social History Social History   Tobacco Use  . Smoking status: Current Every Day Smoker    Packs/day: 0.25    Years: 40.00    Pack years: 10.00  . Smokeless tobacco: Never Used  . Tobacco comment: smoking 5 cigs/day  Substance Use Topics  . Alcohol use: Yes    Alcohol/week: 8.4 oz    Types: 14 Cans of beer per week    Comment: pt states "2 24oz daily when I can"  . Drug use: Yes    Types: Marijuana     Allergies   Codeine   Review of Systems Review of Systems  Constitutional: Negative for chills and fever.  HENT: Negative for ear pain and sore throat.   Eyes: Negative for pain and visual disturbance.  Respiratory: Negative for cough and shortness of  breath.   Cardiovascular: Negative for chest pain and palpitations.  Gastrointestinal: Negative for abdominal pain and vomiting.  Genitourinary: Negative for dysuria and hematuria.  Musculoskeletal: Positive for back pain. Negative for arthralgias.  Skin: Negative for color change and rash.  Neurological: Positive for weakness and numbness. Negative for seizures and syncope.  All other systems reviewed and are negative.    Physical Exam Updated Vital Signs BP (!) 137/98 (BP Location: Left Arm)   Pulse 71   Temp 98.4 F (36.9 C) (Oral)   Resp 16   Ht 5\' 9"  (1.753 m)   Wt 84.8 kg (187 lb)   SpO2 98%   BMI 27.62 kg/m   Physical Exam  Constitutional: He appears well-developed and well-nourished.  HENT:  Head: Normocephalic and atraumatic.  Eyes: Conjunctivae are normal.  Neck: Neck supple.  Cardiovascular: Normal rate and regular rhythm.  No murmur heard. Pulmonary/Chest: Effort normal and breath sounds normal. No respiratory distress.  Abdominal: Soft. There is no tenderness.  Genitourinary: Rectum normal. Rectal exam shows anal tone normal.  Musculoskeletal: He exhibits no edema.  Neurological: He is alert. A sensory deficit is present. No cranial nerve deficit. GCS eye subscore is 4. GCS verbal subscore is 5. GCS motor subscore is 6.  Reflex Scores:      Patellar reflexes are 1+ on the right side and 1+ on the left side.      Achilles reflexes are 1+ on the right side and 1+ on the left side. Decreased sensation to LT to bilateral lower extremities to level of knee. 0/5 strength bilateral dorsiflexion 5/5 strength plantarflexion 5/5 hip flexion/extension  Skin: Skin is warm and dry.  Psychiatric: He has a normal mood and affect.  Nursing note and vitals reviewed.    ED Treatments / Results  Labs (all labs ordered are listed, but only abnormal results are displayed) Labs Reviewed  VITAMIN B12  TSH  RPR  PROTEIN ELECTROPHORESIS, SERUM  UPEP/UIFE/LIGHT CHAINS/TP,  24-HR UR    EKG None  Radiology Mr Lumbar Spine Wo Contrast  Result Date: 08/18/2017 CLINICAL DATA:  Bilateral foot drop for 1 week, bilateral lower extremity paresthesias. EXAM: MRI LUMBAR SPINE WITHOUT CONTRAST TECHNIQUE: Multiplanar, multisequence MR imaging of the lumbar spine was performed. No intravenous contrast was administered. COMPARISON:  Lumbar spine radiographs Aug 12, 2017 and MRI lumbar spine June 02, 2016 FINDINGS: SEGMENTATION: For the purposes of this report, the last well-formed intervertebral disc is reported as L5-S1. ALIGNMENT: Maintained lumbar lordosis. No malalignment. VERTEBRAE:Vertebral bodies are intact. Moderate L5-S1 disc height loss and desiccation mild acute on chronic discogenic endplate changes similar to prior MRI. Mild L3-4 disc height loss, similar. Mild T11-12 degenerative disc. No suspicious bone marrow signal. CONUS MEDULLARIS AND CAUDA EQUINA: Conus medullaris terminates at L1-2 and demonstrates normal morphology and signal characteristics. Cauda equina is normal. PARASPINAL AND OTHER SOFT TISSUES: Included prevertebral and paraspinal soft tissues are normal. DISC LEVELS: L1-2 L2-3: No disc bulge, canal stenosis nor neural foraminal narrowing. L3-4, L4-5: No disc bulge, canal stenosis nor neural foraminal narrowing. Mild facet arthropathy. L5-S1: Stable 3 mm broad-based disc bulge asymmetric to the RIGHT. Mild facet arthropathy. No canal stenosis. Mild to moderate neural foraminal narrowing. IMPRESSION: 1. Degenerative change of the lumbar spine without fracture or malalignment. 2. No canal stenosis. Mild to moderate RIGHT L5-S1 neural foraminal narrowing. Electronically Signed   By: Elon Alas M.D.   On: 08/18/2017 18:33    Procedures Procedures (including critical care time)  Medications Ordered in ED Medications - No data to display   Initial Impression / Assessment and Plan / ED Course  I have reviewed the triage vital signs and the nursing  notes.  Pertinent labs & imaging results that were available during my care of the patient were reviewed by me and considered in my medical decision making (see chart for details).    Patient is a 52 year old male with history of alcohol abuse, depression/anxiety, seizures, chronic low back pain (uses wheelchair at all times) who presents with bilateral lower extremity paresthesias and weakness.  Exam as above, significant for 0 out of 5 strength bilateral foot dorsiflexion, decreased sensation to light touch extending up to knees bilaterally.  Reflexes intact.    MRI lumbar spine negative for acute findings.  Not suspicious for cauda equina at this time given normal rectal tone, absence of bowel/bladder incontinence, and no saddle anesthesia.  No MRI findings to suggest cauda equina either.  Discussed with neurology.  Sent off labs for RPR, B12, TSH, SPEP/UPEP to be followed up by neurology.  Intact reflexes, less suspicious for GBS at this time. Will give patient instructions for outpatient EMG nerve conduction study to be obtained within next 4 weeks.  We will also give neurology outpatient resources.  I gave patient strict return precautions, including any worsening of weakness, paresthesias, or any new concerning neurological symptoms.  Patient and plan of care discussed with Attending physician, Dr. Alvino Chapel.     Final Clinical Impressions(s) / ED Diagnoses   Final diagnoses:  Weakness  Paresthesias  Foot drop, bilateral    ED Discharge Orders    None       Arnetha Massy, MD 08/18/17 2054    Davonna Belling, MD 08/19/17 0000

## 2017-08-18 NOTE — ED Notes (Signed)
Patient transported to MRI 

## 2017-08-18 NOTE — ED Notes (Signed)
ED Provider at bedside. 

## 2017-08-18 NOTE — ED Notes (Signed)
Called Pt in lobby for vital recheck, no response in lobby x1. 

## 2017-08-18 NOTE — ED Notes (Signed)
Pt aware urine sample is needed 

## 2017-08-19 ENCOUNTER — Encounter: Payer: Self-pay | Admitting: Neurology

## 2017-08-20 LAB — RPR: RPR Ser Ql: NONREACTIVE

## 2017-08-23 LAB — PROTEIN ELECTROPHORESIS, SERUM
A/G Ratio: 1.7 (ref 0.7–1.7)
ALPHA-1-GLOBULIN: 0.2 g/dL (ref 0.0–0.4)
ALPHA-2-GLOBULIN: 0.5 g/dL (ref 0.4–1.0)
Albumin ELP: 4.3 g/dL (ref 2.9–4.4)
BETA GLOBULIN: 1 g/dL (ref 0.7–1.3)
GAMMA GLOBULIN: 0.8 g/dL (ref 0.4–1.8)
Globulin, Total: 2.5 g/dL (ref 2.2–3.9)
TOTAL PROTEIN ELP: 6.8 g/dL (ref 6.0–8.5)

## 2017-09-27 ENCOUNTER — Ambulatory Visit: Payer: Medicaid Other | Admitting: Gastroenterology

## 2017-10-12 ENCOUNTER — Other Ambulatory Visit (INDEPENDENT_AMBULATORY_CARE_PROVIDER_SITE_OTHER): Payer: Medicaid Other

## 2017-10-12 ENCOUNTER — Encounter: Payer: Self-pay | Admitting: Neurology

## 2017-10-12 ENCOUNTER — Ambulatory Visit: Payer: Medicaid Other | Admitting: Neurology

## 2017-10-12 VITALS — BP 104/78 | HR 88 | Ht 69.0 in | Wt 155.0 lb

## 2017-10-12 DIAGNOSIS — G40909 Epilepsy, unspecified, not intractable, without status epilepticus: Secondary | ICD-10-CM | POA: Diagnosis not present

## 2017-10-12 DIAGNOSIS — Z720 Tobacco use: Secondary | ICD-10-CM | POA: Diagnosis not present

## 2017-10-12 DIAGNOSIS — M21372 Foot drop, left foot: Secondary | ICD-10-CM

## 2017-10-12 DIAGNOSIS — M21371 Foot drop, right foot: Secondary | ICD-10-CM

## 2017-10-12 DIAGNOSIS — G621 Alcoholic polyneuropathy: Secondary | ICD-10-CM | POA: Diagnosis not present

## 2017-10-12 LAB — C-REACTIVE PROTEIN: CRP: 0.1 mg/dL — ABNORMAL LOW (ref 0.5–20.0)

## 2017-10-12 LAB — SEDIMENTATION RATE: Sed Rate: 8 mm/hr (ref 0–20)

## 2017-10-12 NOTE — Patient Instructions (Addendum)
Check labs  NCS/EMG of the legs  Please try to stop drinking alcohol and smoking  Referral to [facility] for bilateral ankle foot orthotics

## 2017-10-12 NOTE — Progress Notes (Signed)
[facility] Clinic Note - Initial Visit   Date: 10/12/17  Cody Delacruz MRN: [id number] DOB: May 27, 1965   Dear Alvester Chou, NP:  Thank you for your kind referral of Cody Delacruz for consultation of bilateral leg weakness and numbness. Although his history is well known to you, please allow [place] to reiterate it for the purpose of our medical record. The patient was accompanied to the clinic by self.  History of Present Illness: Cody Delacruz is a 52 y.o. right-handed Caucasian male with alcohol abuse, tobacco use, depression, seizure disorder, GERD, hyperlipidemia presenting for evaluation of bilateral leg weakness.    Starting around early May 2019, he began having numbness and tingling of the feet and lower legs up to the level of the knees.  Over the next four weeks, he steadily developed greater difficulty with balance, weakness of the legs, and started having increased falls. He is falling 1-2 times per week and has sustained superficial injuries.  He usually crawls to something to pull himself up.  He has been using a cane since 2011.  He also complains of severe cramps.  He does not have similar symptoms in the hands.  He was drinking (2) 24 oz beers daily x 10 years and quit drinking 4 days ago. He smokes about 3 cigarretes daily and is trying to quit.  He was smoking 1ppd since the age of 89. He usually uses a wheelchair because of chronic low back pain for the past two years.    He has history of generalized seizures which started in 2001.  He takes depakote 584m three times.  He is compliant with medications.  He last seizure was 2 months ago.  He typically gets seizure once per month which lasts 5 minutes.  The last time he saw a neurologist [place]. He reports being toxic on a previous AED, but does not recall the name.  He does not drive.    Out-side paper records, electronic medical record, and images have been  reviewed where available and summarized as:  MRI lumbar spine wo contrast 08/18/2017: 1. Degenerative change of the lumbar spine without fracture or malalignment. 2. No canal stenosis. Mild to moderate RIGHT L5-S1 neural foraminal narrowing.  Labs 08/18/2017:  Vitamin B12 498, TSH 1.159, RPR neg, SPEP with IFE no M protein   Past Medical History:  Diagnosis Date  . Anxiety   . Back pain   . Depression   . Epilepsy, unspecified, not intractable, without status epilepticus ([place]   . Epilepsy, unspecified, not intractable, without status epilepticus ([place]   . GERD (gastroesophageal reflux disease)   . Hemorrhoids   . Hypercholesteremia 03/2017   210  . Neck pain   . Other acariasis    Hep C  . PTSD (post-traumatic stress disorder)   . Rectal bleeding   . Seizures ([facility]     Past Surgical History:  Procedure Laterality Date  . HERNIA REPAIR    . JOINT REPLACEMENT     Knee     Medications:  Outpatient Encounter Medications as of 10/12/2017  Medication Sig  . Cyanocobalamin (VITAMIN B-12 PO) Take by mouth.  . Cyanocobalamin (VITAMIN B-12) 2000 MCG TBCR Take by mouth.  . divalproex (DEPAKOTE ER) 500 MG 24 hr tablet Take 1 tablet (500 mg total) by mouth 3 (three) times daily.  . DULoxetine (CYMBALTA) 30 MG capsule Take 1 capsule (30 mg total) by mouth daily.  . folic  acid (FOLVITE) 1 MG tablet Take 1 tablet (1 mg total) by mouth daily.  . hydrocortisone (ANUSOL-HC) 25 MG suppository Place 1 suppository (25 mg total) rectally 2 (two) times daily.  Marland Kitchen lisinopril (PRINIVIL,ZESTRIL) 5 MG tablet TK 1 T PO QD  . LORazepam (ATIVAN) 0.5 MG tablet TAKE 1 TABLET BY MOUTH DAILY  . Multiple Vitamin (MULTIVITAMIN WITH MINERALS) TABS tablet Take 1 tablet by mouth daily.  . nicotine (NICODERM CQ - DOSED IN MG/24 HOURS) 21 mg/24hr patch Place 1 patch (21 mg total) onto the skin daily.  Marland Kitchen OLANZapine (ZYPREXA) 10 MG tablet Take 1 tablet (10 mg total) by mouth daily.  Marland Kitchen omeprazole (PRILOSEC) 20 MG  capsule Take 2 capsules (40 mg total) by mouth daily.  . pravastatin (PRAVACHOL) 40 MG tablet Take 1 tablet (40 mg total) by mouth daily.  . traMADol (ULTRAM) 50 MG tablet TAKE 1 TABLET BY MOUTH BID  . traZODone (DESYREL) 100 MG tablet Take 1 tablet (100 mg total) by mouth at bedtime. (Patient taking differently: Take 100 mg by mouth at bedtime as needed for sleep. )  . [DISCONTINUED] acetaminophen-codeine (TYLENOL #3) 300-30 MG tablet TAKE 1 TABLET BY MOUTH EVERY 6 HOURS AS NEEDED FOR MODERATE PAIN (Patient not taking: Reported on 04/19/2017)  . [DISCONTINUED] ondansetron (ZOFRAN) 4 MG tablet Take 1 tablet (4 mg total) by mouth every 4 (four) hours as needed for nausea. (Patient not taking: Reported on 08/18/2017)  . [DISCONTINUED] thiamine 100 MG tablet Take 1 tablet (100 mg total) by mouth daily. (Patient not taking: Reported on 08/18/2017)   No facility-administered encounter medications on file as of 10/12/2017.      Allergies:  Allergies  Allergen Reactions  . Codeine Other (See Comments)    Unknown childhood allergy    Family History: Family History  Problem Relation Age of Onset  . Cancer Mother 33       myosarcoma  . Lung cancer Father   . Emphysema Father   . Leukemia Sister 44  . Lung cancer Brother 4  . Cancer Maternal Grandmother     Social History: Social History   Tobacco Use  . Smoking status: Current Every Day Smoker    Packs/day: 0.25    Years: 40.00    Pack years: 10.00  . Smokeless tobacco: Never Used  . Tobacco comment: smoking 5 cigs/day  Substance Use Topics  . Alcohol use: Yes    Alcohol/week: 8.4 oz    Types: 14 Cans of beer per week    Comment: pt states "2 24oz daily when I can"  . Drug use: Not Currently    Types: Marijuana   Social History   Social History Narrative   He has been on disability since 2017.  He was previously painting billboards. He lives in a one story home.      Review of Systems:  CONSTITUTIONAL: No fevers, chills,  night sweats, or weight loss.   EYES: No visual changes or eye pain ENT: No hearing changes.  No history of nose bleeds.   RESPIRATORY: No cough, wheezing and shortness of breath.   CARDIOVASCULAR: Negative for chest pain, and palpitations.   GI: Negative for abdominal discomfort, blood in stools or black stools.  No recent change in bowel habits.   GU:  No history of incontinence.   MUSCLOSKELETAL: +history of joint pain or swelling.  +myalgias.   SKIN: Negative for lesions, rash, and itching.   HEMATOLOGY/ONCOLOGY: Negative for prolonged bleeding, bruising easily, and swollen nodes.  No history of cancer.   ENDOCRINE: Negative for cold or heat intolerance, polydipsia or goiter.   PSYCH:  +depression or anxiety symptoms.   NEURO: As Above.   Vital Signs:  BP 104/78   Pulse 88   Ht _0  (1.753 m)   Wt 155 lb (70.3 kg)   SpO2 94%   BMI 22.89 kg/m    General Medical Exam:   General:  Poorly groomed, comfortable.   Eyes/ENT: see cranial nerve examination.   Neck: No masses appreciated.  Full range of motion without tenderness.  No carotid bruits. Respiratory:  Clear to auscultation, good air entry bilaterally.   Cardiac:  Regular rate and rhythm, no murmur.   Extremities:  No deformities, edema, or skin discoloration.  Skin:  No rashes or lesions.  Neurological Exam: MENTAL STATUS including orientation to time, place, person, recent and remote memory, attention span and concentration, language, and fund of knowledge is fairly intact.  Speech is not dysarthric.  CRANIAL NERVES: II:  No visual field defects.  Unremarkable fundi.   III-IV-VI: Pupils equal round and reactive to light.  Normal conjugate, extra-ocular eye movements in all directions of gaze.  No nystagmus.  No ptosis.   V:  Normal facial sensation.     VII:  Normal facial symmetry and movements.  VIII:  Normal hearing and vestibular function.   IX-X:  Normal palatal movement.   XI:  Normal shoulder shrug and head  rotation.   XII:  Normal tongue strength and range of motion, no deviation or fasciculation.  MOTOR:  Moderate atrophy and loss of muscle bulk in the lower legs.  No fasciculations or abnormal movements.  No pronator drift.  Tone is normal.    Right Upper Extremity:    Left Upper Extremity:    Deltoid  5/5   Deltoid  5/5   Biceps  5/5   Biceps  5/5   Triceps  5/5   Triceps  5/5   Wrist extensors  5/5   Wrist extensors  5/5   Wrist flexors  5/5   Wrist flexors  5/5   Finger extensors  5/5   Finger extensors  5/5   Finger flexors  5/5   Finger flexors  5/5   Dorsal interossei  5/5   Dorsal interossei  5/5   Abductor pollicis  5/5   Abductor pollicis  5/5   Tone (Ashworth scale)  0  Tone (Ashworth scale)  0   Right Lower Extremity:    Left Lower Extremity:    Hip flexors  5/5   Hip flexors  5/5   Hip extensors  5/5   Hip extensors  5/5   Knee flexors  5/5   Knee flexors  5/5   Knee extensors  5/5   Knee extensors  5/5   Dorsiflexors  3/5   Dorsiflexors  3-/5   Plantarflexors  3+/5   Plantarflexors  3+/5   Toe extensors  2/5   Toe extensors  2/5   Toe flexors  3/5   Toe flexors  3/5   Tone (Ashworth scale)  0  Tone (Ashworth scale)  0   MSRs:  Right  Left brachioradialis 2+  brachioradialis 2+  biceps 2+  biceps 2+  triceps 2+  triceps 2+  patellar 2+  patellar 2+  ankle jerk 0  ankle jerk 0  Hoffman no  Hoffman no  plantar response down  plantar response down   SENSORY: Temperature, light touch, and pin prick reduced in a gradient pattern below the knees and absent distal to ankles bilaterally.  Rhomberg sign is positive.  COORDINATION/GAIT: Normal finger-to- nose-finger.  Intact rapid alternating movements bilaterally.  Unable to rise from a chair without using arms.  Gait wide-based with L > R foot drop, unsteady and assisted with cane    IMPRESSION: 1.  Alcohol-induced peripheral neuropathy.  Doubt symptoms are  due to an inflammatory/autoimmine-mediated process, but laboratory testing will be ordered to screen for these conditions as well as vitamin deficiency.  Check ESR, CRP, folate, vitamin B1, MMA, copper.  Vitamin B12, TSH, RPR, and SPEP with IFE is normal.   Proceed with NCS/EMG of the legs to evaluate for neuropathy vs polyradiculoneuropathy.   Referral to [facility] for bilateral AFO Strongly urged to stop alcohol and he admits to being sober for the past 4 days Fall precautions discussed, recommend using walker as cane is not providing adequate support  2.  Seizure disorder.  He continues to have GTCs about once per month, with greatest seizure-free period of 2 months.  His reduced frequency may be due to him trying to quit alcohol.  I will check valproate level and EEG.  In the meantime, continue depakote 547m three times daily.  Consider adding second agent, such as keppra going forward.  3.  Tobacco abuse.  Currently smoking 3 cigarettes daily, down from 1PPD.  Patient was informed of the dangers of tobacco abuse including stroke, cancer, and MI, as well as benefits of tobacco cessation.  Patient is willing to quit.  Approximately 5 mins were spent counseling patient cessation techniques. We discussed various methods to help quit smoking, including deciding on a date to quit, joining a support group, pharmacological agents- nicotine gum/patch/lozenges, chantix.  I will reassess his progress at the next follow-up visit   Thank you for allowing me to participate in patient's care.  If I can answer any additional questions, I would be pleased to do so.    Sincerely,    Loveda Colaizzi K. PPosey Pronto DO

## 2017-10-13 LAB — FOLATE: FOLATE: 16.5 ng/mL (ref >5.9)

## 2017-10-15 LAB — VALPROIC ACID LEVEL: Valproic Acid Lvl: <12.5 mg/L — ABNORMAL LOW (ref 50.0–100.0)

## 2017-10-15 LAB — VITAMIN B1: Vitamin B1 (Thiamine): 9 nmol/L (ref 8–30)

## 2017-10-15 LAB — METHYLMALONIC ACID, SERUM: METHYLMALONIC ACID, QUANT: 60 nmol/L — AB (ref 87–318)

## 2017-10-15 LAB — COPPER, SERUM: Copper: 70 ug/dL (ref 70–175)

## 2017-10-19 ENCOUNTER — Telehealth: Payer: Self-pay | Admitting: *Deleted

## 2017-10-19 NOTE — Telephone Encounter (Signed)
Left message for patient to call me back. 

## 2017-10-19 NOTE — Telephone Encounter (Signed)
-----   Message from Alda Berthold, DO sent at 10/18/2017  3:37 PM EDT ----- Please inform patient that his valproate levels are very low.  Can you verify that he is taking his medications as prescribed?  He should be taking depakote 500mg  three times daily.  Other labs are normal. Thanks.

## 2017-10-21 ENCOUNTER — Telehealth: Payer: Self-pay | Admitting: *Deleted

## 2017-10-21 NOTE — Telephone Encounter (Signed)
-----   Message from Alda Berthold, DO sent at 10/18/2017  3:37 PM EDT ----- Please inform patient that his valproate levels are very low.  Can you verify that he is taking his medications as prescribed?  He should be taking depakote 500mg  three times daily.  Other labs are normal. Thanks.

## 2017-10-21 NOTE — Telephone Encounter (Signed)
Left message for patient to call me back. 

## 2017-10-24 ENCOUNTER — Telehealth: Payer: Self-pay | Admitting: *Deleted

## 2017-10-24 NOTE — Telephone Encounter (Signed)
-----   Message from Alda Berthold, DO sent at 10/18/2017  3:37 PM EDT ----- Please inform patient that his valproate levels are very low.  Can you verify that he is taking his medications as prescribed?  He should be taking depakote 500mg  three times daily.  Other labs are normal. Thanks.

## 2017-10-24 NOTE — Telephone Encounter (Signed)
Patient is coming in tomorrow.  I will go over results and instructions with him.

## 2017-10-25 ENCOUNTER — Ambulatory Visit (INDEPENDENT_AMBULATORY_CARE_PROVIDER_SITE_OTHER): Payer: Medicaid Other | Admitting: Neurology

## 2017-10-25 DIAGNOSIS — G621 Alcoholic polyneuropathy: Secondary | ICD-10-CM

## 2017-10-25 DIAGNOSIS — M21371 Foot drop, right foot: Secondary | ICD-10-CM

## 2017-10-25 DIAGNOSIS — G5730 Lesion of lateral popliteal nerve, unspecified lower limb: Secondary | ICD-10-CM

## 2017-10-25 DIAGNOSIS — G610 Guillain-Barre syndrome: Secondary | ICD-10-CM | POA: Diagnosis not present

## 2017-10-25 DIAGNOSIS — M21372 Foot drop, left foot: Secondary | ICD-10-CM

## 2017-10-25 NOTE — Procedures (Signed)
[facility]  [address] [place], [place] [address] Tel: [phone] Fax:  [phone] Test Date:  10/25/2017  Patient: Cody Delacruz DOB: Jul 18, 1965 Physician: Narda Amber, DO  Sex: Male Height: 5\' 9"  Ref Phys: Narda Amber, DO  ID#: [id number] Temp: 33.8C Technician:    Patient Complaints: This is a 52 year old man referred for evaluation of bilateral feet numbness and tingling and foot drop.  NCV & EMG Findings: Extensive electrodiagnostic testing of the right lower extremity and additional studies of the left shows:  1. Bilateral sural and superficial peroneal sensory responses are within normal limits. 2. Bilateral peroneal motor responses at the extensor digitorum brevis are absent and severely reduced at the tibialis anterior.  Right peroneal (TA) shows prolonged distal onset latency (5.4 ms), reduced amplitude (0.9 mV), and decreased conduction velocity (Poplit-Fib Head, 29 m/s), and on the left, peroneal (TA) is prolonged (4.6 ms) and reduced (1.0 mV).  Bilateral tibial motor responses are within normal limits. 3. Bilateral tibial H reflex studies show prolonged latency. Right tibial F-wave is absent. 4. Severe active on chronic motor axon loss changes are seen affecting the tibialis anterior, fibularis longus, and extensor hallucis longus muscles. Sparse chronic motor axon loss changes are seen in the rectus femoris muscles bilaterally. There is no fibrillations in the biceps femoris short head, gluteus medius, or paraspinal muscles.   Impression: The electrophysiologic findings are most consistent with a subacute and severe common peroneal neuropathy, demyelinating and axon loss in type, affecting bilateral lower extremities.  Alternatively, a polyradiculoneuropathy can be considered given abnormal late responses.  Correlate clinically.  There is no evidence of a large fiber sensorimotor polyneuropathy.   ___________________________ Narda Amber,  DO    Nerve Conduction Studies Anti Sensory Summary Table   Site NR Peak (ms) Norm Peak (ms) P-T Amp (V) Norm P-T Amp  Left Sup Peroneal Anti Sensory (Ant Lat Mall)  33.8C  12 cm    3.5 <4.6 7.2 >4  Right Sup Peroneal Anti Sensory (Ant Lat Mall)  33.8C  12 cm    3.3 <4.6 6.3 >4  Left Sural Anti Sensory (Lat Mall)  33.8C  Calf    4.1 <4.6 6.2 >4  Right Sural Anti Sensory (Lat Mall)  33.8C  Calf    3.2 <4.6 4.7 >4   Motor Summary Table   Site NR Onset (ms) Norm Onset (ms) O-P Amp (mV) Norm O-P Amp Site1 Site2 Delta-0 (ms) Dist (cm) Vel (m/s) Norm Vel (m/s)  Left Peroneal Motor (Ext Dig Brev)  33.8C  Ankle NR  <6.0  >2.5 B Fib Ankle  0.0  >40  B Fib NR     Poplt B Fib  0.0  >40  Poplt NR            Right Peroneal Motor (Ext Dig Brev)  33.8C  Ankle NR  <6.0  >2.5 B Fib Ankle  0.0  >40  B Fib NR     Poplt B Fib  0.0  >40  Poplt NR            Left Peroneal TA Motor (Tib Ant)  33.8C  Fib Head    4.6 <4.5 1.0 >3 Poplit Fib Head 0.9 8.0 89 >40  Poplit    5.5  0.7         Right Peroneal TA Motor (Tib Ant)  33.8C  Fib Head    5.4 <4.5 0.9 >3 Poplit Fib Head 2.8 8.0 29 >40  Poplit  8.2  0.9         Left Tibial Motor (Abd Hall Brev)  33.8C  Ankle    4.0 <6.0 9.8 >4 Knee Ankle 10.2 44.0 43 >40  Knee    14.2  6.0         Right Tibial Motor (Abd Hall Brev)  33.8C  Ankle    3.2 <6.0 11.3 >4 Knee Ankle 10.7 43.0 40 >40  Knee    13.9  6.5          F Wave Studies   NR F-Lat (ms) Lat Norm (ms) L-R F-Lat (ms)  Right Tibial (Mrkrs) (Abd Hallucis)  33.8C  NR  <55    H Reflex Studies   NR H-Lat (ms) Lat Norm (ms) L-R H-Lat (ms)  Left Tibial (Gastroc)  33.8C     38.37 <35 1.77  Right Tibial (Gastroc)  33.8C     36.60 <35 1.77   EMG   Side Muscle Ins Act Fibs Psw Fasc Number Recrt Dur Dur. Amp Amp. Poly Poly. Comment  Right AntTibialis Nml 2+ Nml Nml SMU Rapid All 1+ All 1+ All 1+ N/A  Right Gastroc Nml Nml Nml Nml Nml Nml Nml Nml Nml Nml Nml Nml N/A  Right Flex Dig  Long Nml Nml Nml Nml 1- Mod-V Nml Nml Nml Nml Nml Nml N/A  Right RectFemoris Nml Nml Nml Nml 1- Rapid Some 1+ Some 1+ Some 1+ N/A  Right GluteusMed Nml Nml Nml Nml Nml Nml Nml Nml Nml Nml Nml Nml N/A  Right Lumbo Parasp Low Nml Nml Nml Nml NE - - - - - - - N/A  Right Fibularis Long Nml 2+ Nml Nml 3- Rapid Some 1+ Some 1+ Nml Nml N/A  Right BicepsFemS Nml Nml Nml Nml Nml Nml Nml Nml Nml Nml Nml Nml N/A  Right ExtHallLong Nml Nml Nml Nml SMU Rapid All 1+ All 1+ All 1+ N/A  Left Gastroc Nml Nml Nml Nml Nml Nml Nml Nml Nml Nml Nml Nml N/A  Left Flex Dig Long Nml Nml Nml Nml 1- Mod-V Nml Nml Nml Nml Nml Nml N/A  Left AntTibialis Nml 2+ Nml Nml SMU Rapid All 1+ All 1+ All 1+ N/A  Left GluteusMed Nml Nml Nml Nml Nml Nml Nml Nml Nml Nml Nml Nml N/A  Left BicepsFemS Nml Nml Nml Nml Nml Nml Nml Nml Nml Nml Nml Nml N/A  Left RectFemoris Nml Nml Nml Nml 1- Rapid Some 1+ Some 1+ Nml Nml N/A      Waveforms:

## 2017-10-25 NOTE — Progress Notes (Signed)
Follow-up Visit   Date: 10/25/17    Cody Delacruz MRN: [id number] DOB: [birth year]   Interim History: Cody Delacruz is a 52 y.o. right-handed Caucasian male with alcohol abuse, tobacco use, depression, seizure disorder, GERD, hyperlipidemia returning to the clinic for electrodiagnostic testing and to review his labs.  The patient was accompanied to the clinic by self.  History of present illness: Starting around early May 2019, he began having numbness and tingling of the feet and lower legs up to the level of the knees.  Over the next four weeks, he steadily developed greater difficulty with balance, weakness of the legs, and started having increased falls. He is falling 1-2 times per week and has sustained superficial injuries.  He usually crawls to something to pull himself up.  He has been using a cane since 2011.  He also complains of severe cramps.  He does not have similar symptoms in the hands.  He was drinking (2) 24 oz beers daily x 10 years and quit drinking 4 days ago. He smokes about 3 cigarretes daily and is trying to quit.  He was smoking 1ppd since the age of 88. He usually uses a wheelchair because of chronic low back pain for the past two years.    He has history of generalized seizures which started in 2001.  He takes depakote 589m three times.  He is compliant with medications.  He last seizure was 2 months ago.  He typically gets seizure once per month which lasts 5 minutes.  The last time he saw a neurologist [place]. He reports being toxic on a previous AED, but does not recall the name.  He does not drive.    UPDATE 10/25/2017:  He continues to have severe bilateral foot drop and is undergoing evaluation by orthotics for bilateral AFO.he tells me that he is waiting for insurance approval. His laboratory testing showed undetectable valproic acid levels and upon further questioning, patient admits to being noncompliant. He denies  any worsening weakness in the legs or new paresthesias or weakness in the arms.  Medications:  Current Outpatient Medications on File Prior to Visit  Medication Sig Dispense Refill  . Cyanocobalamin (VITAMIN B-12 PO) Take by mouth.    . Cyanocobalamin (VITAMIN B-12) 2000 MCG TBCR Take by mouth.    . divalproex (DEPAKOTE ER) 500 MG 24 hr tablet Take 1 tablet (500 mg total) by mouth 3 (three) times daily. 90 tablet 1  . DULoxetine (CYMBALTA) 30 MG capsule Take 1 capsule (30 mg total) by mouth daily. 30 capsule 1  . folic acid (FOLVITE) 1 MG tablet Take 1 tablet (1 mg total) by mouth daily. 30 tablet 1  . hydrocortisone (ANUSOL-HC) 25 MG suppository Place 1 suppository (25 mg total) rectally 2 (two) times daily. 12 suppository 0  . lisinopril (PRINIVIL,ZESTRIL) 5 MG tablet TK 1 T PO QD  5  . LORazepam (ATIVAN) 0.5 MG tablet TAKE 1 TABLET BY MOUTH DAILY  1  . Multiple Vitamin (MULTIVITAMIN WITH MINERALS) TABS tablet Take 1 tablet by mouth daily. 30 tablet 0  . nicotine (NICODERM CQ - DOSED IN MG/24 HOURS) 21 mg/24hr patch Place 1 patch (21 mg total) onto the skin daily. 28 patch 3  . OLANZapine (ZYPREXA) 10 MG tablet Take 1 tablet (10 mg total) by mouth daily. 30 tablet 1  . omeprazole (PRILOSEC) 20 MG capsule Take 2 capsules (40 mg total) by mouth daily. 60 capsule  2  . pravastatin (PRAVACHOL) 40 MG tablet Take 1 tablet (40 mg total) by mouth daily. 90 tablet 3  . traMADol (ULTRAM) 50 MG tablet TAKE 1 TABLET BY MOUTH BID  5  . traZODone (DESYREL) 100 MG tablet Take 1 tablet (100 mg total) by mouth at bedtime. (Patient taking differently: Take 100 mg by mouth at bedtime as needed for sleep. ) 30 tablet 3   No current facility-administered medications on file prior to visit.     Allergies:  Allergies  Allergen Reactions  . Codeine Other (See Comments)    Unknown childhood allergy    Review of Systems:  CONSTITUTIONAL: No fevers, chills, night sweats, or weight loss.  EYES: No visual  changes or eye pain ENT: No hearing changes.  No history of nose bleeds.   RESPIRATORY: No cough, wheezing and shortness of breath.   CARDIOVASCULAR: Negative for chest pain, and palpitations.   GI: Negative for abdominal discomfort, blood in stools or black stools.  No recent change in bowel habits.   GU:  No history of incontinence.   MUSCLOSKELETAL: No history of joint pain or swelling.  No myalgias.   SKIN: Negative for lesions, rash, and itching.   ENDOCRINE: Negative for cold or heat intolerance, polydipsia or goiter.   PSYCH:  No depression or anxiety symptoms.   NEURO: As Above.  Exam is deferred   Data: Labs 10/12/2017:  VPA <12, MMA 60, copper 70, vitamin B1 9, folate 16.5, ESR 8, CRP 0.1  NCS/EMG of the legs 10/25/2017: The electrophysiologic findings are most consistent with a subacute and severe common peroneal neuropathy, demyelinating and axon loss in type, affecting bilateral lower extremities.  Alternatively, a polyradiculoneuropathy can be considered given abnormal late responses.  Correlate clinically.  There is no evidence of a large fiber sensorimotor polyneuropathy.  IMPRESSION/PLAN: 1.  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  I would not expect bilateral peroneal neuropathy from compressive lesion and therefore need to evaluate for a widespread immune-mediated process. MRI of the lumbar spine without contrast from May 2019 did not show any nerve injury and the L5 level, an EMG of the proximal L5 muscles was normal.   The next step will be a lumbar puncture - check CSF cell count and diff, protein, glucose, IgG index, oligoclonal bands, flow cytology, CSF lyme PCR.  Contrary to my initial suspicion and to my surprise, there is no evidence of a large fiber sensorimotor polyneuropathy.  2.  Seizure disorder.  Medication compliance was strongly encouraged.  Continue depakote 546m TID.    The duration of this  appointment visit was 20 minutes of face-to-face time with the patient.  Greater than 50% of this time was spent in counseling, explanation of diagnosis, planning of further management, and coordination of care.   Thank you for allowing me to participate in patient's care.  If I can answer any additional questions, I would be pleased to do so.    Sincerely,    Shirel Mallis K. PPosey Pronto DO

## 2017-10-26 ENCOUNTER — Other Ambulatory Visit: Payer: Self-pay | Admitting: *Deleted

## 2017-10-26 DIAGNOSIS — G5730 Lesion of lateral popliteal nerve, unspecified lower limb: Secondary | ICD-10-CM

## 2017-10-26 DIAGNOSIS — M21372 Foot drop, left foot: Secondary | ICD-10-CM

## 2017-10-26 DIAGNOSIS — G610 Guillain-Barre syndrome: Secondary | ICD-10-CM

## 2017-10-26 DIAGNOSIS — M21371 Foot drop, right foot: Secondary | ICD-10-CM

## 2017-10-26 NOTE — Progress Notes (Signed)
I spoke with patient and informed him that we will be ordering the LP.  Roberta at [facility] notified and will contact patient.

## 2017-11-14 ENCOUNTER — Ambulatory Visit
Admission: RE | Admit: 2017-11-14 | Discharge: 2017-11-14 | Disposition: A | Discharge status: Home / Self Care | Payer: Medicaid Other | Source: Ambulatory Visit | Attending: Neurology | Admitting: Neurology

## 2017-11-14 ENCOUNTER — Telehealth: Payer: Self-pay | Admitting: Neurology

## 2017-11-14 ENCOUNTER — Other Ambulatory Visit (HOSPITAL_COMMUNITY)
Admission: RE | Admit: 2017-11-14 | Discharge: 2017-11-14 | Disposition: A | Discharge status: Home / Self Care | Payer: Medicaid Other | Source: Ambulatory Visit | Attending: Neurology | Admitting: Neurology

## 2017-11-14 DIAGNOSIS — M21372 Foot drop, left foot: Secondary | ICD-10-CM | POA: Diagnosis not present

## 2017-11-14 DIAGNOSIS — M21371 Foot drop, right foot: Secondary | ICD-10-CM | POA: Diagnosis not present

## 2017-11-14 DIAGNOSIS — G610 Guillain-Barre syndrome: Secondary | ICD-10-CM | POA: Insufficient documentation

## 2017-11-14 DIAGNOSIS — G5730 Lesion of lateral popliteal nerve, unspecified lower limb: Secondary | ICD-10-CM

## 2017-11-14 NOTE — Telephone Encounter (Signed)
Noted  

## 2017-11-14 NOTE — Telephone Encounter (Signed)
[facility] lmom regarding this patient and they also faxed papers to the office as well. Thanks

## 2017-11-14 NOTE — Telephone Encounter (Signed)
Patient wanted to let us know that he had his spinal tap done

## 2017-11-14 NOTE — Progress Notes (Signed)
Blood obtained from L AC for LP labs. Pt tolerated procedure well. Site is unremarkable. 

## 2017-11-14 NOTE — Discharge Instructions (Signed)

## 2017-11-17 ENCOUNTER — Telehealth: Payer: Self-pay | Admitting: Neurology

## 2017-11-17 NOTE — Telephone Encounter (Signed)
Please let him know that all the results are not back yet, but there is signs of inflammation suggesting that the nerves are being affected. Once all the results are back, I will call him to let him know the next step.    Donika K. Posey Pronto, DO

## 2017-11-17 NOTE — Telephone Encounter (Signed)
Please advise 

## 2017-11-17 NOTE — Telephone Encounter (Signed)
Patient wanting results from Monday 8.26.19.

## 2017-11-17 NOTE — Telephone Encounter (Signed)
Patient given results so far and informed that I will call him with the rest of them when they come in.

## 2017-11-20 LAB — PROTEIN, CSF: Total Protein, CSF: 70 mg/dL — ABNORMAL HIGH (ref 15–45)

## 2017-11-20 LAB — CSF CELL COUNT WITH DIFFERENTIAL
RBC Count, CSF: 0 cells/uL (ref 0–10)
WBC, CSF: 0 cells/uL (ref 0–5)

## 2017-11-20 LAB — BORRELIA SPECIES DNA, FLUID, PCR: B. burgdorferi DNA: NOT DETECTED

## 2017-11-20 LAB — CNS IGG SYNTHESIS RATE, CSF+BLOOD
ALBUMIN SERUM: 4.8 g/dL (ref 3.5–5.2)
ALBUMIN,CSF: 43.9 mg/dL — AB (ref 8.0–42.0)
CNS-IgG Synthesis Rate: -4.6 mg/24 h (ref <=3.3)
IGG TOTAL CSF: 4.9 mg/dL (ref 0.8–7.7)
IgG (Immunoglobin G), Serum: 1220 mg/dL (ref 600–1640)
IgG-Index: 0.44 (ref <0.66)

## 2017-11-20 LAB — OLIGOCLONAL BANDS, CSF + SERM

## 2017-11-20 LAB — GLUCOSE, CSF: Glucose, CSF: 67 mg/dL (ref 40–80)

## 2017-11-23 ENCOUNTER — Telehealth: Payer: Self-pay | Admitting: Neurology

## 2017-11-23 NOTE — Telephone Encounter (Signed)
I attempted to contact patient via phone today regarding the results of CSF, however there was no answer so a message was left for the patient to return my call.   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  I will discuss risks and benefits of IVMP vs IVIG for management of CIDP.  Donika K. Posey Pronto, DO

## 2017-11-26 NOTE — Telephone Encounter (Signed)
Called patient and discussed diagnosis of CIDP and management options.  He was informed of the risks and benefits of both IVMP and IVIG and would like to proceed with home infusions with IVIG, as he does not drive due to history of seizures and severe leg weakness/neuropathy.  We will start IVIG 2kg over 5 days, followed by 1mg /kg every 28 days.   All questions were answered.  I will see him back in 6 weeks.  Aaralyn Kil K. Posey Pronto, DO

## 2017-11-29 ENCOUNTER — Ambulatory Visit: Payer: Medicaid Other | Admitting: Gastroenterology

## 2017-11-29 NOTE — Telephone Encounter (Signed)
Patient notified that I will send in his referral tomorrow for the IVIG.

## 2017-12-11 IMAGING — MR MR LUMBAR SPINE W/O CM
4 of 5 series · 19 of 48 positions shown · non-contrast
Comparison: 05/17/2013

CLINICAL DATA: Chronic low back pain.

EXAM:
MRI LUMBAR SPINE WITHOUT CONTRAST
TECHNIQUE: Multiplanar, multisequence MR imaging of the lumbar spine was
performed. No intravenous contrast was administered.

[Series 3: T2 · sagittal · 4.0mm · 0.55mm/px · 6 of 15 slices shown (1 of 2)]
[im 1/15]
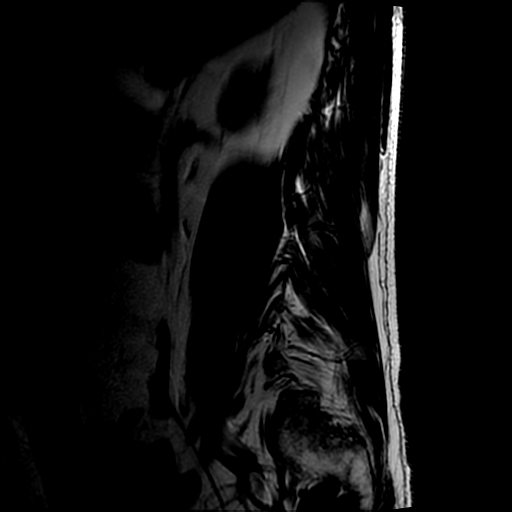
[im 3/15]
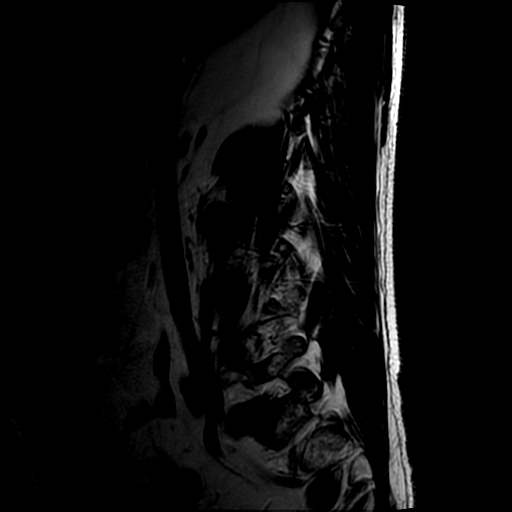
[im 6/15]
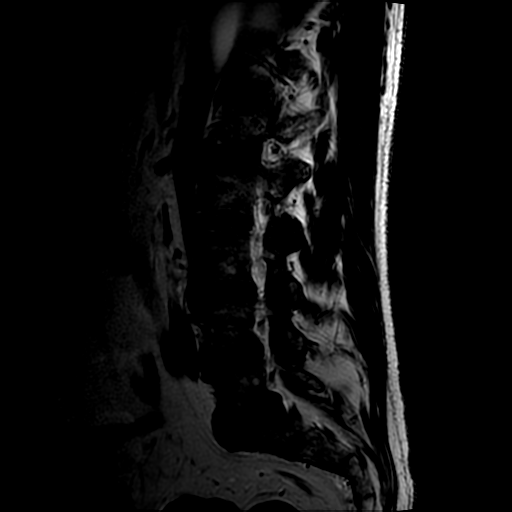
[im 9/15]
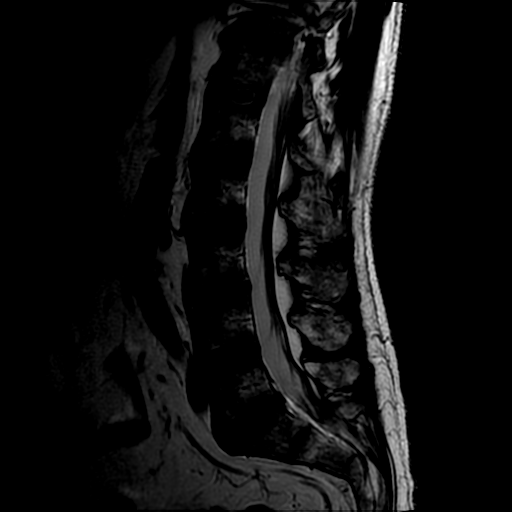
[im 12/15]
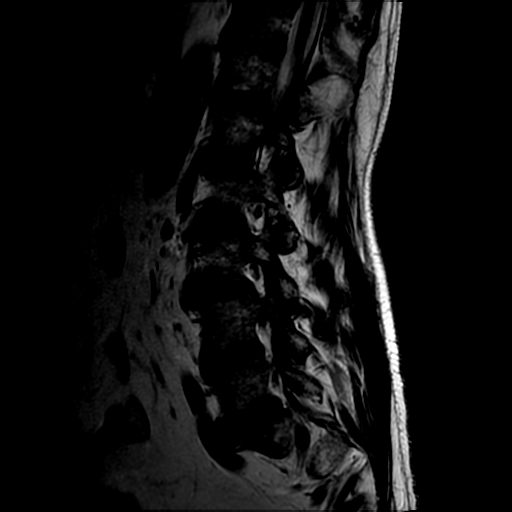
[im 15/15]
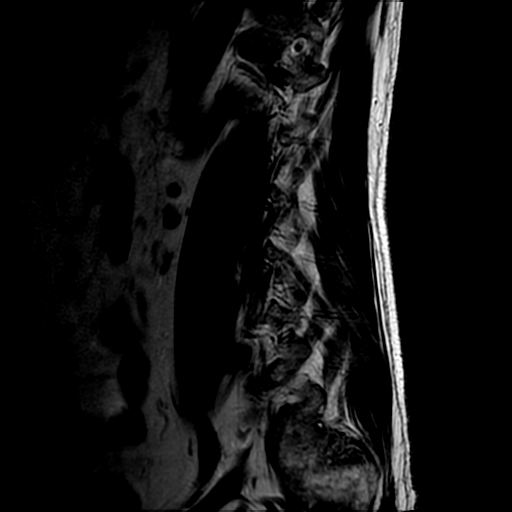

[Series 4: T1 · sagittal · 4.0mm · 0.55mm/px · 3 of 15 slices shown (1 of 2)]
[im 3/15]
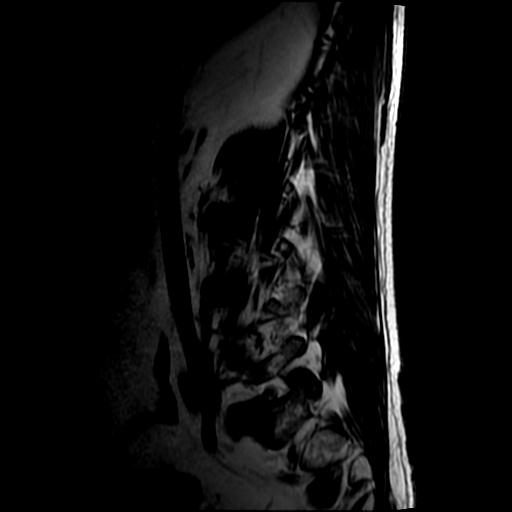
[im 9/15]
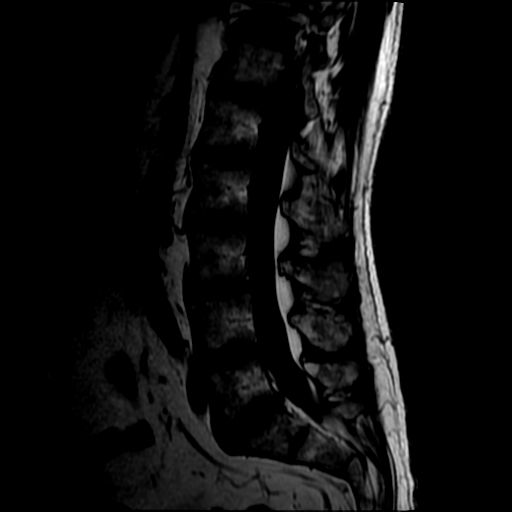
[im 15/15]
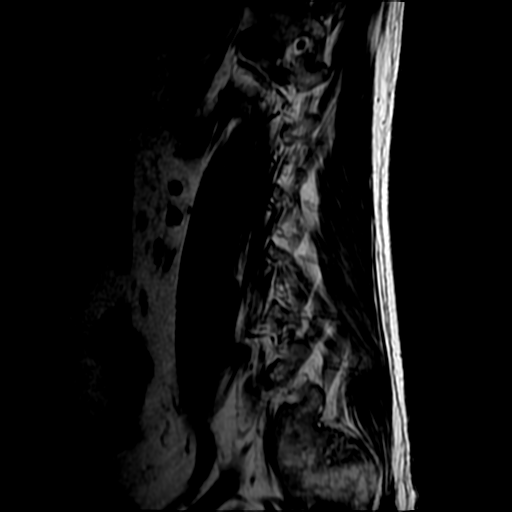

[Series 5: T2 · axial · 4.0mm · 0.39mm/px · z∈[-67,+96]mm · 7 of 35 slices shown (2 of 2)]
[im 1/35]
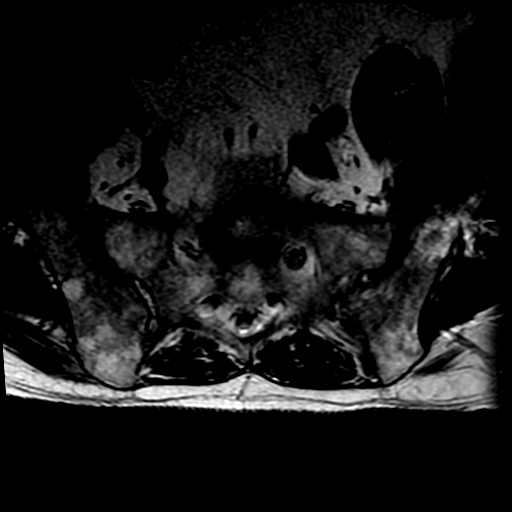
[im 5/35]
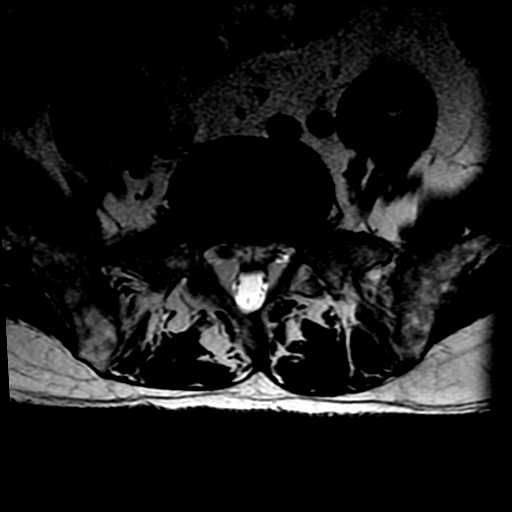
[im 10/35]
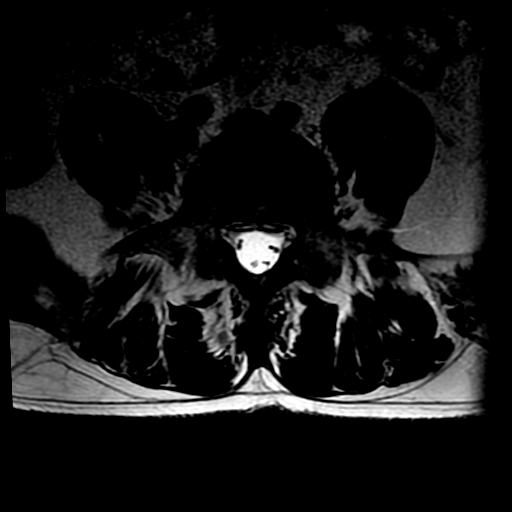
[im 15/35]
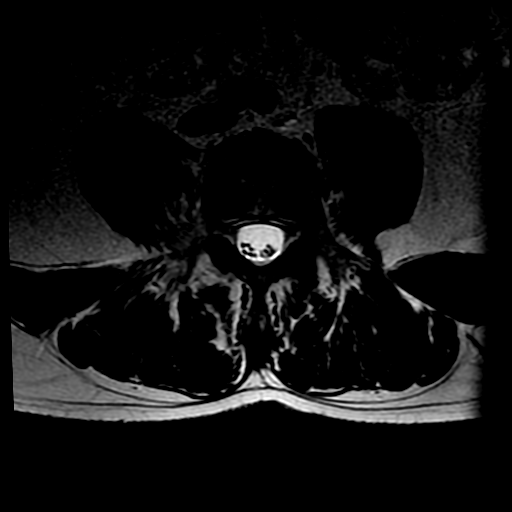
[im 18/35]
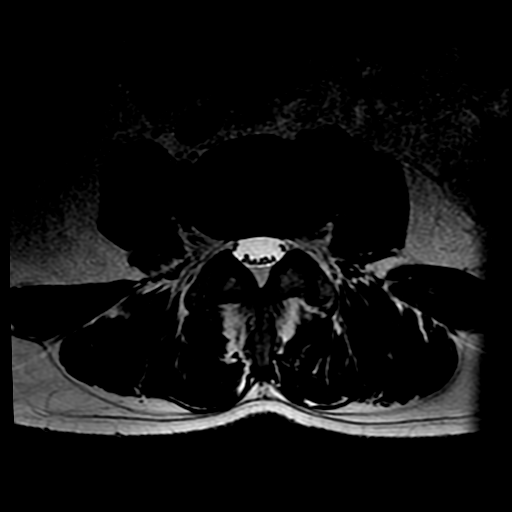
[im 20/35]
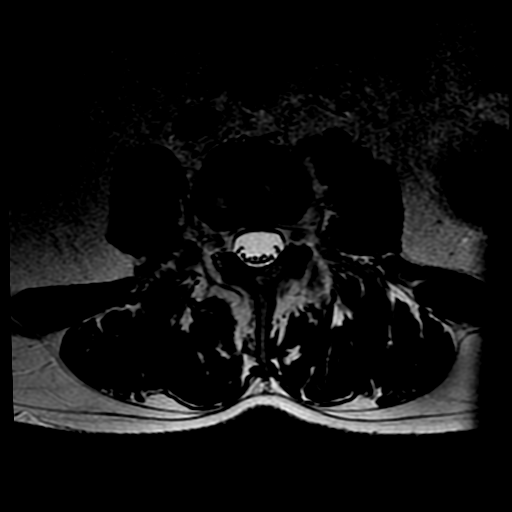
[im 30/35]
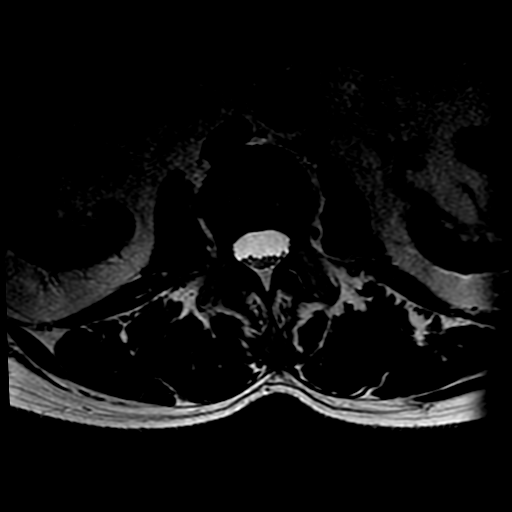

[Series 7: T1 · axial · 4.0mm · 0.39mm/px · z∈[-47,+96]mm · 3 of 35 slices shown (2 of 2)]
[im 5/35]
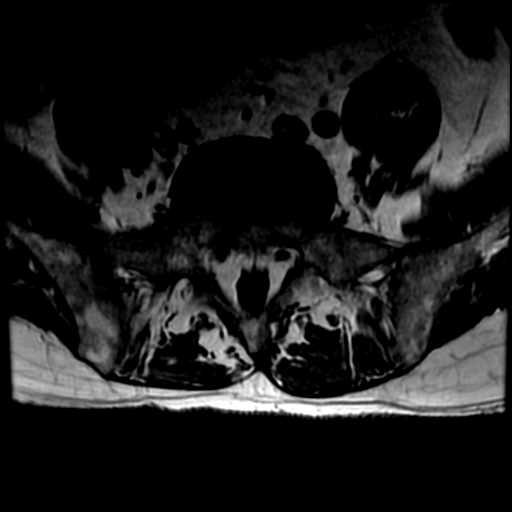
[im 18/35]
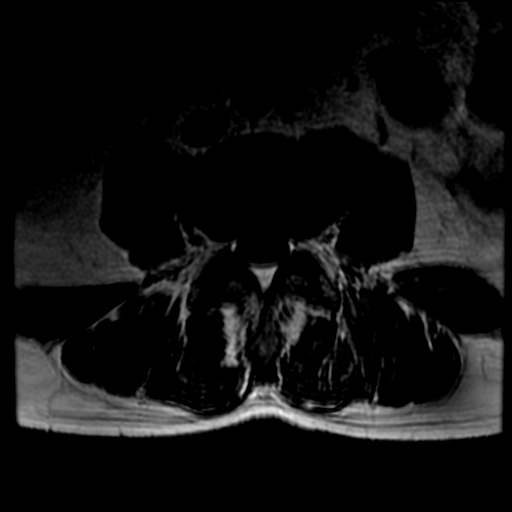
[im 30/35]
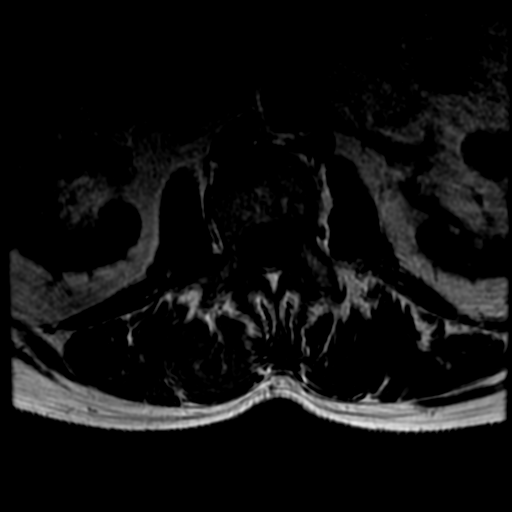

[19 of 48 positions shown; findings below may reference images not displayed]

FINDINGS: Segmentation:  Standard.

Alignment:  Mild lumbar dextroscoliosis.  No listhesis.

Vertebrae: No evidence of fracture or significant marrow edema.
Mildly heterogeneous bone marrow signal diffusely, similar to the
prior study and nonspecific. No suspicious focal osseous lesion
identified.

Conus medullaris: Extends to the L1 level and appears normal.

Paraspinal and other soft tissues: Small parapelvic renal cysts
bilaterally.

Disc levels:

L1-2 and L2-3:  Negative.

L3-4: Progressive disc desiccation with unchanged mild disc space
narrowing. Mild disc bulging and mild facet hypertrophy without
stenosis, unchanged.

L4-5: Progressive disc desiccation. Minimal disc bulging and facet
hypertrophy without stenosis.

L5-S1: Progressive disc desiccation with unchanged mild disc space
narrowing. Mild disc bulging asymmetric to the right and mild facet
hypertrophy result in mild right neural foraminal stenosis, stable
to minimally increased. A tiny right central annular fissure is
again noted. No spinal stenosis.
IMPRESSION: 1. Progressive disc desiccation L3-4 to L5-S1.
2. Mild right neural foraminal narrowing at L5-S1 due to disc
bulging, stable to minimally increased.

## 2017-12-12 ENCOUNTER — Telehealth: Payer: Self-pay | Admitting: Neurology

## 2017-12-12 NOTE — Telephone Encounter (Signed)
Noted  

## 2017-12-12 NOTE — Telephone Encounter (Signed)
Patient has started his IV Infusions and wanted you to know they had started. Thanks

## 2017-12-17 ENCOUNTER — Encounter (HOSPITAL_COMMUNITY): Payer: Self-pay

## 2017-12-17 ENCOUNTER — Emergency Department (HOSPITAL_COMMUNITY): Payer: Medicaid Other

## 2017-12-17 ENCOUNTER — Emergency Department (HOSPITAL_COMMUNITY)
Admission: EM | Admit: 2017-12-17 | Discharge: 2017-12-17 | Disposition: A | Discharge status: Home / Self Care | Payer: Medicaid Other | Attending: Emergency Medicine | Admitting: Emergency Medicine

## 2017-12-17 DIAGNOSIS — Y9389 Activity, other specified: Secondary | ICD-10-CM | POA: Insufficient documentation

## 2017-12-17 DIAGNOSIS — Z96659 Presence of unspecified artificial knee joint: Secondary | ICD-10-CM | POA: Insufficient documentation

## 2017-12-17 DIAGNOSIS — S0990XA Unspecified injury of head, initial encounter: Secondary | ICD-10-CM | POA: Diagnosis not present

## 2017-12-17 DIAGNOSIS — Y929 Unspecified place or not applicable: Secondary | ICD-10-CM | POA: Insufficient documentation

## 2017-12-17 DIAGNOSIS — F329 Major depressive disorder, single episode, unspecified: Secondary | ICD-10-CM | POA: Insufficient documentation

## 2017-12-17 DIAGNOSIS — W0110XA Fall on same level from slipping, tripping and stumbling with subsequent striking against unspecified object, initial encounter: Secondary | ICD-10-CM | POA: Diagnosis not present

## 2017-12-17 DIAGNOSIS — F17210 Nicotine dependence, cigarettes, uncomplicated: Secondary | ICD-10-CM | POA: Insufficient documentation

## 2017-12-17 DIAGNOSIS — Y998 Other external cause status: Secondary | ICD-10-CM | POA: Insufficient documentation

## 2017-12-17 DIAGNOSIS — Z79899 Other long term (current) drug therapy: Secondary | ICD-10-CM | POA: Diagnosis not present

## 2017-12-17 DIAGNOSIS — W19XXXA Unspecified fall, initial encounter: Secondary | ICD-10-CM

## 2017-12-17 DIAGNOSIS — F1010 Alcohol abuse, uncomplicated: Secondary | ICD-10-CM | POA: Diagnosis not present

## 2017-12-17 DIAGNOSIS — F419 Anxiety disorder, unspecified: Secondary | ICD-10-CM | POA: Insufficient documentation

## 2017-12-17 MED ORDER — ACETAMINOPHEN 325 MG PO TABS
650.0000 mg | ORAL_TABLET | Freq: Once | ORAL | Status: AC
  Administered 2017-12-17: 650 mg via ORAL
  Filled 2017-12-17: qty 2

## 2017-12-17 NOTE — ED Notes (Signed)
Bed: HF95 Expected date:  Expected time:  Means of arrival:  Comments: 52 yo fall

## 2017-12-17 NOTE — ED Notes (Signed)
Patient states he can not walk and needs transportation. [facility] has been called. Paper work in bin.

## 2017-12-17 NOTE — ED Triage Notes (Signed)
Patient arrived via [facility] from home. Patient called out for multiple falls. 2 falls earlier today. Found in his bed c/o of neck and lower back pain that is chronic and new pain. C-collar on. Patient states Etoh on board. Beer at bedside and smelt on breath. Patient has neuropathy and bilateral drop foot. Patient has a motorized scooter but also crawls around his home. 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 Last treatment yesterday. Patient states he has CIDP.

## 2017-12-17 NOTE — ED Provider Notes (Signed)
[facility] DEPT Provider Note   CSN: [id number] Arrival date & time: 12/17/17  1317     History   Chief Complaint Chief Complaint  Patient presents with  . Fall    HPI Cody Delacruz is a 52 y.o. male.  Patient with history of chronic inflammatory demyelinating polyneuropathy, alcoholic neuropathy who presents the ED after fall.  Patient states may be hit his head.  Patient is not on blood thinners.  Patient recently diagnosed with CIDP and just finished IVIG infusions.  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  Patient states that he has had some improvement following IVIG infusions but states that he continues to drink alcohol which he was drinking today.  Denies any headache, nausea, vomiting.  No back pain.  No extremity pain.  Patient is to continue IVIG infusions monthly.  The history is provided by the patient.  Fall  This is a new problem. The current episode started 3 to 5 hours ago. The problem occurs daily. The problem has been resolved. Pertinent negatives include no chest pain, no abdominal pain, no headaches and no shortness of breath. Nothing aggravates the symptoms. Nothing relieves the symptoms. The treatment provided no relief.    Past Medical History:  Diagnosis Date  . Anxiety   . Back pain   . Depression   . Epilepsy, unspecified, not intractable, without status epilepticus ([place])   . Epilepsy, unspecified, not intractable, without status epilepticus ([place])   . GERD (gastroesophageal reflux disease)   . Hemorrhoids   . Hypercholesteremia 03/2017   210  . Neck pain   . Other acariasis    Hep C  . PTSD (post-traumatic stress disorder)   . Rectal bleeding   . Seizures [facility])     Patient Active Problem List   Diagnosis Date Noted  . GI bleed 04/20/2017  . Rectal bleeding 04/19/2017  . Colitis 04/19/2017  . Rectal bleed  11/18/2016  . Dental abscess 09/01/2016  . Moderate episode of recurrent major depressive disorder ([place]) 09/01/2016  . Smoking addiction 09/01/2016  . Depression 08/08/2016  . Tobacco abuse 08/08/2016  . Alcohol abuse 08/08/2016  . Seizure ([place]) 08/08/2016  . Alcoholic intoxication with complication ([place])   . Numbness and tingling in both hands 01/22/2016  . Chronic pain syndrome 01/22/2016  . Assistance needed for mobility 01/22/2016  . Healthcare maintenance 03/10/2015  . Dyslipidemia 08/26/2014  . Bilateral low back pain with left-sided sciatica 02/21/2014  . GERD (gastroesophageal reflux disease) 05/01/2013  . Seizure disorder ([place]) 05/01/2013  . Neck pain, chronic 10/31/2012  . Chronic bilateral low back pain with left-sided sciatica 10/31/2012    Past Surgical History:  Procedure Laterality Date  . HERNIA REPAIR    . JOINT REPLACEMENT     Knee        Home Medications    Prior to Admission medications   Medication Sig Start Date End Date Taking? Authorizing Provider  Cyanocobalamin (VITAMIN B-12 PO) Take by mouth.   Yes [provider]  divalproex (DEPAKOTE ER) 500 MG 24 hr tablet Take 1 tablet (500 mg total) by mouth 3 (three) times daily. 04/21/17  Yes Emokpae, Courage, MD  DULoxetine (CYMBALTA) 30 MG capsule Take 1 capsule (30 mg total) by mouth daily. 04/21/17  Yes Emokpae, Courage, MD  folic acid (FOLVITE) 1 MG tablet Take 1 tablet (1 mg total) by mouth daily. 04/22/17  Yes Emokpae, Courage, MD  lisinopril (PRINIVIL,ZESTRIL) 5 MG tablet Take 5 mg by mouth daily.  07/04/17  Yes [provider]  LORazepam (ATIVAN) 0.5 MG tablet TAKE 1 TABLET BY MOUTH DAILY 07/28/17  Yes [provider]  Multiple Vitamin (MULTIVITAMIN WITH MINERALS) TABS tablet Take 1 tablet by mouth daily. 04/22/17  Yes Emokpae, Courage, MD  omeprazole (PRILOSEC) 20 MG capsule Take 2 capsules (40 mg total) by mouth daily. 04/21/17  Yes Emokpae, Courage, MD  pravastatin (PRAVACHOL)  40 MG tablet Take 1 tablet (40 mg total) by mouth daily. 05/19/16  Yes Tresa Garter, MD  traMADol (ULTRAM) 50 MG tablet Take 50 mg by mouth 2 (two) times daily.  07/29/17  Yes [provider]  traZODone (DESYREL) 100 MG tablet Take 1 tablet (100 mg total) by mouth at bedtime. Patient taking differently: Take 100 mg by mouth at bedtime as needed for sleep.  04/21/17  Yes Roxan Hockey, MD    Family History Family History  Problem Relation Age of Onset  . Cancer Mother 74       myosarcoma  . Lung cancer Father   . Emphysema Father   . Leukemia Sister 76  . Lung cancer Brother 43  . Cancer Maternal Grandmother     Social History Social History   Tobacco Use  . Smoking status: Current Every Day Smoker    Packs/day: 0.25    Years: 40.00    Pack years: 10.00  . Smokeless tobacco: Never Used  . Tobacco comment: smoking 5 cigs/day  Substance Use Topics  . Alcohol use: Yes    Alcohol/week: 14.0 standard drinks    Types: 14 Cans of beer per week    Comment: pt states "2 24oz daily when I can"  . Drug use: Not Currently    Types: Marijuana     Allergies   Codeine   Review of Systems Review of Systems  Constitutional: Negative for chills and fever.  HENT: Negative for ear pain and sore throat.   Eyes: Negative for pain and visual disturbance.  Respiratory: Negative for cough and shortness of breath.   Cardiovascular: Negative for chest pain and palpitations.  Gastrointestinal: Negative for abdominal pain and vomiting.  Genitourinary: Negative for dysuria and hematuria.  Musculoskeletal: Positive for gait problem. Negative for arthralgias, back pain, joint swelling, myalgias, neck pain and neck stiffness.  Skin: Negative for color change and rash.  Neurological: Positive for weakness. Negative for dizziness, tremors, seizures, syncope, facial asymmetry, speech difficulty, light-headedness, numbness and headaches.  All other systems reviewed and are  negative.    Physical Exam Updated Vital Signs  ED Triage Vitals  Enc Vitals Group     BP 12/17/17 1326 (!) 144/92     Pulse Rate 12/17/17 1326 100     Resp 12/17/17 1326 18     Temp 12/17/17 1326 98 F (36.7 C)     Temp Source 12/17/17 1326 Oral     SpO2 12/17/17 1325 100 %     Weight --      Height --      Head Circumference --      Peak Flow --      Pain Score --      Pain Loc --      Pain Edu? --      Excl. in [place]? --     Physical Exam  Constitutional: He is oriented to person, place, and time. He appears well-developed and well-nourished.  HENT:  Head: Normocephalic and atraumatic.  Eyes: Pupils are equal, round, and reactive to light. Conjunctivae and EOM are normal.  Neck: Normal range of motion. Neck supple.  Cardiovascular: Normal rate, regular rhythm, normal heart sounds and intact distal pulses.  No murmur heard. Pulmonary/Chest: Effort normal and breath sounds normal. No respiratory distress.  Abdominal: Soft. Bowel sounds are normal. He exhibits no distension. There is no tenderness.  Musculoskeletal: Normal range of motion. He exhibits no edema.  No midline spinal pain  Neurological: He is alert and oriented to person, place, and time. No cranial nerve deficit.  Patient with 5+ out of 5 strength in upper extremities, normal sensation in upper extremities, patient with 0+ out of 5 strength in bilateral dorsiflexion, 1+ out of 5 strength bilaterally and plantar flexion otherwise 5+ out of 5 strength in the lower extremities, decreased sensation bilaterally up to the knees otherwise normal sensation in lower extremities, no drift, normal finger-to-nose finger  Skin: Skin is warm and dry. Capillary refill takes less than 2 seconds.  Psychiatric: He has a normal mood and affect.  Nursing note and vitals reviewed.    ED Treatments / Results  Labs (all labs ordered are listed, but only abnormal results are displayed) Labs Reviewed - No data to  display  EKG None  Radiology Ct Head Wo Contrast  Result Date: 12/17/2017 CLINICAL DATA:  Pain after trauma EXAM: CT HEAD WITHOUT CONTRAST CT CERVICAL SPINE WITHOUT CONTRAST TECHNIQUE: Multidetector CT imaging of the head and cervical spine was performed following the standard protocol without intravenous contrast. Multiplanar CT image reconstructions of the cervical spine were also generated. COMPARISON:  Aug 07, 2016 FINDINGS: CT HEAD FINDINGS Brain: No subdural, epidural, or subarachnoid hemorrhage. Cerebellum, brainstem, and basal cisterns are normal. No mass effect or midline shift. The ventricles are normal. The sulci are mildly prominent suggesting mild volume loss. No acute cortical ischemia or infarct. Vascular: No hyperdense vessel or unexpected calcification. Skull: Normal. Negative for fracture or focal lesion. Sinuses/Orbits: No acute finding. Other: None. CT CERVICAL SPINE FINDINGS Alignment: Normal. Skull base and vertebrae: No acute fracture. No primary bone lesion or focal pathologic process. Soft tissues and spinal canal: No prevertebral fluid or swelling. No visible canal hematoma. Disc levels:  Mild multilevel degenerative changes. Upper chest: Negative. Other: No other abnormalities. IMPRESSION: 1. No acute intracranial abnormality. 2. No fracture or traumatic malalignment in the cervical spine. Mild multilevel degenerative disc disease. Electronically Signed   By: Dorise Bullion III M.D   On: 12/17/2017 14:49   Ct Cervical Spine Wo Contrast  Result Date: 12/17/2017 CLINICAL DATA:  Low back pain.  Multiple falls. EXAM: CT HEAD WITHOUT CONTRAST CT CERVICAL SPINE WITHOUT CONTRAST TECHNIQUE: Multidetector CT imaging of the head and cervical spine was performed following the standard protocol without intravenous contrast. Multiplanar CT image reconstructions of the cervical spine were also generated. COMPARISON:  None. FINDINGS: CT HEAD FINDINGs: No acute intracranial hemorrhage. No  focal mass lesion. No CT evidence of acute infarction. No midline shift or mass effect. No hydrocephalus. Basilar cisterns are patent. Mild atrophy. Vascular: No hyperdense vessel or unexpected calcification. Skull: Normal. Negative for fracture or focal lesion. Sinuses/Orbits: Paranasal sinuses and mastoid air cells are clear. Orbits are clear. Other: None. CT CERVICAL SPINE FINDINGS Alignment: Normal alignment of the cervical vertebral bodies. Skull base and vertebrae: Normal craniocervical junction. No loss of vertebral body height or disc height. Normal facet articulation. No evidence of fracture. Soft tissues and spinal canal: No prevertebral  soft tissue swelling. No perispinal or epidural hematoma. Disc levels:  Unremarkable Upper chest: Clear Other: None IMPRESSION: 1. No acute intracranial findings. No trauma. 2. Mild Cerebral atrophy. 3. No cervical spine fracture. Electronically Signed   By: Suzy Bouchard M.D.   On: 12/17/2017 14:54    Procedures Procedures (including critical care time)  Medications Ordered in ED Medications  acetaminophen (TYLENOL) tablet 650 mg (has no administration in time range)     Initial Impression / Assessment and Plan / ED Course  I have reviewed the triage vital signs and the nursing notes.  Pertinent labs & imaging results that were available during my care of the patient were reviewed by me and considered in my medical decision making (see chart for details).     Cody Delacruz is a 52 year old male with history of chronic inflammatory demyelinating polyneuropathy, alcohol abuse and alcoholic neuropathy who presents  to the ED after fall.  Patient with unremarkable vitals.  No fever.  Patient recently finished IVIG infusion over the last 5 days for new diagnosis of CIDP.  Patient states that he tried to get up from his wheelchair and fell forward hitting his head.  Did not lose consciousness.  Has some neck pain.  Has been drinking alcohol today.   Patient states that at baseline he is unable to use his legs.  He has orthotics that he is supposed to use but he has not worn.  He has decreased sensation in bilateral lower extremities up to his knees which is baseline, he has weakness and plantarflexion and no strength in dorsiflexion with appears baseline.  No other bony tenderness on exam.  CT head and neck were obtained that showed no acute fractures.  Contacted neurology to discuss case as patient recently had IVIG infusion.  It does not appear that patient has had much improvement of symptoms following this therapy however there is nothing further to do for this patient except to encourage him to quit drinking alcohol.  Suspect that most of his neuropathy is likely secondary to alcohol given the symptoms.  Patient has follow-up with neurology with Dr. Posey Pronto in several weeks.  He is going to continue monthly IVIG injections.  Prolonged discussion with the patient about abstaining from alcohol and seeking help about alcohol abuse.  He was given information to help with alcohol alcohol if wanted.  Recommend that he follow-up with primary care doctor to discuss any physical therapy that may be beneficial to him.  Patient was discharged from ED in good condition. Told to return to ED if symptoms worsen.  Final Clinical Impressions(s) / ED Diagnoses   Final diagnoses:  Fall, initial encounter  ETOH abuse    ED Discharge Orders    None       Lennice Sites, DO 12/17/17 1554

## 2017-12-17 NOTE — ED Notes (Signed)
Patient is calling his brother to come pick him up

## 2017-12-19 ENCOUNTER — Telehealth: Payer: Self-pay | Admitting: Neurology

## 2017-12-19 NOTE — Telephone Encounter (Signed)
Patient left msg with after hours to find out his next steps with treatment. He has just completed the last of his infusion therapy treatments. Please call him back at [phone]. Thanks!

## 2017-12-20 NOTE — Telephone Encounter (Signed)
Attempted to call patient but the call would not go through.  Will try again later.

## 2017-12-20 NOTE — Telephone Encounter (Signed)
Attempted to contact patient again but call would not go through.

## 2017-12-20 NOTE — Telephone Encounter (Signed)
Attempted to contact patient again but call would not go through.  Will try again later.

## 2017-12-20 NOTE — Telephone Encounter (Signed)
I called patient's home number but it was to a motel.

## 2018-01-10 ENCOUNTER — Ambulatory Visit: Payer: Medicaid Other | Admitting: Neurology

## 2018-01-13 ENCOUNTER — Ambulatory Visit: Payer: Medicaid Other | Admitting: Gastroenterology

## 2018-01-17 ENCOUNTER — Ambulatory Visit: Payer: Medicaid Other | Admitting: Neurology

## 2018-01-18 ENCOUNTER — Telehealth: Payer: Self-pay | Admitting: Neurology

## 2018-01-18 NOTE — Telephone Encounter (Signed)
Patient is calling in wanting to get a phneumonia shot if possible with his infusions. Please call him back [phone]. Thanks!

## 2018-01-18 NOTE — Telephone Encounter (Signed)
Patient notified that it is ok to have the pneumonia vaccine.

## 2018-01-18 NOTE — Telephone Encounter (Signed)
Yes, that's fine 

## 2018-01-18 NOTE — Telephone Encounter (Signed)
Ok for pneumonia shot?

## 2018-01-24 ENCOUNTER — Telehealth: Payer: Self-pay | Admitting: Neurology

## 2018-01-24 NOTE — Telephone Encounter (Signed)
Patient called reagarding needing a letter faxed to [facility] for his Therapy treatments and for updates. Please Call. Thanks

## 2018-02-01 NOTE — Telephone Encounter (Signed)
Patient gave me the number to call [employer].  [phone]

## 2018-02-13 ENCOUNTER — Ambulatory Visit: Payer: Medicaid Other | Admitting: Gastroenterology

## 2018-02-15 ENCOUNTER — Encounter: Payer: Self-pay | Admitting: *Deleted

## 2018-03-06 ENCOUNTER — Encounter: Payer: Self-pay | Admitting: Neurology

## 2018-03-06 ENCOUNTER — Other Ambulatory Visit (INDEPENDENT_AMBULATORY_CARE_PROVIDER_SITE_OTHER): Payer: Medicaid Other

## 2018-03-06 ENCOUNTER — Ambulatory Visit: Payer: Medicaid Other | Admitting: Neurology

## 2018-03-06 VITALS — BP 100/70 | HR 95 | Ht 69.0 in | Wt 149.0 lb

## 2018-03-06 DIAGNOSIS — F1010 Alcohol abuse, uncomplicated: Secondary | ICD-10-CM

## 2018-03-06 DIAGNOSIS — Z79899 Other long term (current) drug therapy: Secondary | ICD-10-CM

## 2018-03-06 DIAGNOSIS — G6181 Chronic inflammatory demyelinating polyneuritis: Secondary | ICD-10-CM | POA: Diagnosis not present

## 2018-03-06 DIAGNOSIS — G621 Alcoholic polyneuropathy: Secondary | ICD-10-CM

## 2018-03-06 DIAGNOSIS — G40909 Epilepsy, unspecified, not intractable, without status epilepticus: Secondary | ICD-10-CM

## 2018-03-06 DIAGNOSIS — Z720 Tobacco use: Secondary | ICD-10-CM

## 2018-03-06 NOTE — Progress Notes (Signed)
Follow-up Visit   Date: 03/06/18    Cody Delacruz MRN: [id number] DOB: 1965/09/22   Interim History: Cody Delacruz is a 52 y.o. right-handed Caucasian male with alcohol abuse, tobacco use, depression, seizure disorder, GERD, hyperlipidemia returning to the clinic for follow-up of polyradiculoneuropathy.  The patient was accompanied to the clinic by self.  History of present illness: Starting around early May 2019, he began having numbness and tingling of the feet and lower legs up to the level of the knees.  Over the next four weeks, he steadily developed greater difficulty with balance, weakness of the legs, and started having increased falls. He is falling 1-2 times per week and has sustained superficial injuries.  He usually crawls to something to pull himself up.  He has been using a cane since 2011.  He also complains of severe cramps.  He does not have similar symptoms in the hands.  He was drinking (2) 24 oz beers daily x 10 years and quit drinking 4 days ago. He smokes about 3 cigarretes daily and is trying to quit.  He was smoking 1ppd since the age of 8. He usually uses a wheelchair because of chronic low back pain for the past two years.    He has history of generalized seizures which started in 2001.  He takes depakote 578m three times.  He is compliant with medications.  He last seizure was 2 months ago.  He typically gets seizure once per month which lasts 5 minutes.  The last time he saw a neurologist [place]. He reports being toxic on a previous AED, but does not recall the name.  He does not drive.    UPDATE 10/25/2017:  He continues to have severe bilateral foot drop and is undergoing evaluation by orthotics for bilateral AFO.he tells me that he is waiting for insurance approval. His laboratory testing showed undetectable valproic acid levels and upon further questioning, patient admits to being noncompliant. He denies any worsening  weakness in the legs or new paresthesias or weakness in the arms.  UPDATE 03/06/2018:  In August he has NCS/EMG and CSF testing which was favoring polyradiculoneuropathy and therefore he was treated with IVIG.  He has noticed that he can dorsiflex the left > right foot better.  No change in numbness of the feet and he now has some numbness of the hands.  He continues to drink 2 beers daily. He has one ER visit in September with a fall and fractured his ribs.  He has been more compliant with taking depakote 5020mTID and noticed reduced frequency of seizures.  He had had 2 since his last visit, where as previously occurring every month.    Medications:  Current Outpatient Medications on File Prior to Visit  Medication Sig Dispense Refill  . Cyanocobalamin (VITAMIN B-12 PO) Take by mouth.    . divalproex (DEPAKOTE ER) 500 MG 24 hr tablet Take 1 tablet (500 mg total) by mouth 3 (three) times daily. 90 tablet 1  . DULoxetine (CYMBALTA) 30 MG capsule Take 1 capsule (30 mg total) by mouth daily. 30 capsule 1  . folic acid (FOLVITE) 1 MG tablet Take 1 tablet (1 mg total) by mouth daily. 30 tablet 1  . lisinopril (PRINIVIL,ZESTRIL) 5 MG tablet Take 5 mg by mouth daily.   5  . LORazepam (ATIVAN) 0.5 MG tablet TAKE 1 TABLET BY MOUTH DAILY  1  . Multiple Vitamin (MULTIVITAMIN WITH MINERALS)  TABS tablet Take 1 tablet by mouth daily. 30 tablet 0  . omeprazole (PRILOSEC) 20 MG capsule Take 2 capsules (40 mg total) by mouth daily. 60 capsule 2  . pravastatin (PRAVACHOL) 40 MG tablet Take 1 tablet (40 mg total) by mouth daily. 90 tablet 3  . traMADol (ULTRAM) 50 MG tablet Take 50 mg by mouth 2 (two) times daily.   5  . traZODone (DESYREL) 100 MG tablet Take 1 tablet (100 mg total) by mouth at bedtime. (Patient taking differently: Take 100 mg by mouth at bedtime as needed for sleep. ) 30 tablet 3   No current facility-administered medications on file prior to visit.     Allergies:  Allergies  Allergen  Reactions  . Codeine Other (See Comments)    Unknown childhood allergy    Review of Systems:  CONSTITUTIONAL: No fevers, chills, night sweats, or weight loss.  EYES: No visual changes or eye pain ENT: No hearing changes.  No history of nose bleeds.   RESPIRATORY: No cough, wheezing and shortness of breath.   CARDIOVASCULAR: Negative for chest pain, and palpitations.   GI: Negative for abdominal discomfort, blood in stools or black stools.  No recent change in bowel habits.   GU:  No history of incontinence.   MUSCLOSKELETAL: +history of joint pain or swelling.  No myalgias.   SKIN: Negative for lesions, rash, and itching.   ENDOCRINE: Negative for cold or heat intolerance, polydipsia or goiter.   PSYCH:  No depression or anxiety symptoms.   NEURO: As Above.   Vital Signs:  BP 100/70   Pulse 95   Ht _0  (1.753 m)   Wt 149 lb (67.6 kg)   SpO2 94%   BMI 22.00 kg/m    General Medical Exam:   General:  Dishelved, comfortable  Eyes/ENT: see cranial nerve examination.   Neck:  Full range of motion without tenderness.  No carotid bruits. Respiratory:  Clear to auscultation, good air entry bilaterally.   Cardiac:  Regular rate and rhythm, no murmur.   Ext:  Bilateral lower leg atrophy.   Neurological Exam: MENTAL STATUS including orientation to time, place, person, recent and remote memory, attention span and concentration, language, and fund of knowledge is normal.  Speech is not dysarthric.  CRANIAL NERVES:    Face is symmetric.  MOTOR:  Motor strength is 5/5 in all extremities, except 3/5 with bilateral dorsiflexion, plantar flexion, and toe flexion/extension.  Severe bilateral TA atrophy.  MSRs:  Reflexes are 2+/4 throughout, absent at the achilles bilaterally.  SENSORY:  Absent vibration distal to ankles bilaterally  COORDINATION/GAIT:  Gait assisted with bilateral AFO, appears wide-based, unsteady, improved with using a cane.   Data: MRI lumbar spine wo contrast  08/18/2017: 1. Degenerative change of the lumbar spine without fracture or malalignment. 2. No canal stenosis. Mild to moderate RIGHT L5-S1 neural foraminal narrowing.  Labs 08/18/2017:  Vitamin B12 498, TSH 1.159, RPR neg, SPEP with IFE no M protein Labs 10/12/2017:  VPA <12, MMA 60, copper 70, vitamin B1 9, folate 16.5, ESR 8, CRP 0.1  NCS/EMG of the legs 10/25/2017:  The electrophysiologic findings are most consistent with a subacute and severe common peroneal neuropathy, demyelinating and axon loss in type, affecting bilateral lower extremities. Alternatively, a polyradiculoneuropathy can be considered given abnormal late responses.  Correlate clinically. There is no evidence of a large fiber sensorimotor polyneuropathy.  CSF 11/14/2017:  R0 W0 P70 G67  No OCB, IgG index 0.44  Lyme neg,  cytology neg  IMPRESSION/PLAN: 1.  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  He has been treated with IVIG for the past 2 months and has some improvement with dorsiflexion.  Continue monthly IVIG x 6 months.  I also stressed the importance of abstaining from alcohol as to minimize progression of neuropathy.  Unfortunately, he if continues to drink alcohol there will be little that IVIG can improve.  Continue home PT/OT exercises.  He is very complaint using bilateral AFOs.   2.  Seizure disorder.  Seizure frequency has reduced to once every 2-3 months, whereas previously it would occur monthly. He reports being better compliant with his medications.  Continue depakote 550m three times daily.  Check levels. He does not drink.  3.  Alcohol abuse.  Strongly encouraged to stop drinking alcohol.  Check vitamin B1  4.  Tobacco abuse.  Currently smoking 2 cigarettes daily, down to 1 PPD.   Patient was informed of the dangers of tobacco abuse including stroke, cancer, and MI, as well as benefits of tobacco cessation. He is interested  quitting, but admits to rolling his own cigarettes daily.  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 We discussed various methods to help quit smoking, including deciding on a date to quit, joining a support group, pharmacological agents- nicotine gum/patch/lozenges, chantix.   I will reassess his progress at the next follow-up visit.  Return to clinic in 6 months  Thank you for allowing me to participate in patient's care.  If I can answer any additional questions, I would be pleased to do so.    Sincerely,    Jarryd Gratz K. PPosey Pronto DO

## 2018-03-06 NOTE — Patient Instructions (Signed)
Check labs  Stop smoking and alcohol  Continue depakote 500mg  three times daily  Continue monthly IVIG  Return to clinic in 6 months

## 2018-03-08 ENCOUNTER — Telehealth: Payer: Self-pay | Admitting: Neurology

## 2018-03-08 MED ORDER — LEVETIRACETAM 500 MG PO TABS: 500.0000 mg | ORAL_TABLET | Freq: Two times a day (BID) | ORAL | 5 refills | Status: DC

## 2018-03-08 NOTE — Telephone Encounter (Signed)
Called patient regarding depakote levels and low platelets (72).  Given that depakote can cause platelet dysfunction, I will taper him off this and start Keppra 500mg  BID.  Tapering schedule for depakote given to patient.   Jasai Sorg K. Posey Pronto, DO

## 2018-03-08 NOTE — Telephone Encounter (Signed)
Note faxed.

## 2018-03-10 LAB — VITAMIN B1: Vitamin B1 (Thiamine): 7 nmol/L — ABNORMAL LOW (ref 8–30)

## 2018-03-10 LAB — VALPROIC ACID LEVEL: VALPROIC ACID LVL: 45.7 mg/L — AB (ref 50.0–100.0)

## 2018-03-13 ENCOUNTER — Telehealth: Payer: Self-pay | Admitting: *Deleted

## 2018-03-13 NOTE — Telephone Encounter (Signed)
-----   Message from Alda Berthold, DO sent at 03/13/2018  1:07 PM EST ----- Please tell him that his vitamin B1 is very low - remind him to take thiamine 100mg  daily and abstain from alcohol.  Thanks.

## 2018-03-13 NOTE — Telephone Encounter (Signed)
Patient given results and instructions.   

## 2018-03-17 ENCOUNTER — Encounter: Payer: Self-pay | Admitting: *Deleted

## 2018-03-17 ENCOUNTER — Ambulatory Visit (INDEPENDENT_AMBULATORY_CARE_PROVIDER_SITE_OTHER): Payer: Medicaid Other | Admitting: Gastroenterology

## 2018-03-17 ENCOUNTER — Other Ambulatory Visit (INDEPENDENT_AMBULATORY_CARE_PROVIDER_SITE_OTHER): Payer: Medicaid Other

## 2018-03-17 ENCOUNTER — Encounter: Payer: Self-pay | Admitting: Gastroenterology

## 2018-03-17 VITALS — BP 106/70 | HR 93 | Ht 69.0 in | Wt 154.3 lb

## 2018-03-17 DIAGNOSIS — K625 Hemorrhage of anus and rectum: Secondary | ICD-10-CM | POA: Diagnosis not present

## 2018-03-17 DIAGNOSIS — K649 Unspecified hemorrhoids: Secondary | ICD-10-CM | POA: Diagnosis not present

## 2018-03-17 DIAGNOSIS — Z8619 Personal history of other infectious and parasitic diseases: Secondary | ICD-10-CM | POA: Diagnosis not present

## 2018-03-17 DIAGNOSIS — Z7289 Other problems related to lifestyle: Secondary | ICD-10-CM | POA: Diagnosis not present

## 2018-03-17 DIAGNOSIS — Z789 Other specified health status: Secondary | ICD-10-CM

## 2018-03-17 LAB — CBC WITH DIFFERENTIAL/PLATELET
BASOS ABS: 0 10*3/uL (ref 0.0–0.1)
Basophils Relative: 1 % (ref 0.0–3.0)
EOS ABS: 0 10*3/uL (ref 0.0–0.7)
EOS PCT: 0.9 % (ref 0.0–5.0)
HCT: 41 % (ref 39.0–52.0)
Hemoglobin: 14.2 g/dL (ref 13.0–17.0)
LYMPHS ABS: 1.3 10*3/uL (ref 0.7–4.0)
Lymphocytes Relative: 35.5 % (ref 12.0–46.0)
MCHC: 34.7 g/dL (ref 30.0–36.0)
MCV: 97.6 fl (ref 78.0–100.0)
MONO ABS: 0.5 10*3/uL (ref 0.1–1.0)
Monocytes Relative: 13.8 % — ABNORMAL HIGH (ref 3.0–12.0)
NEUTROS PCT: 48.8 % (ref 43.0–77.0)
Neutro Abs: 1.8 10*3/uL (ref 1.4–7.7)
Platelets: 98 10*3/uL — ABNORMAL LOW (ref 150.0–400.0)
RBC: 4.2 Mil/uL — ABNORMAL LOW (ref 4.22–5.81)
RDW: 14.1 % (ref 11.5–15.5)
WBC: 3.8 10*3/uL — ABNORMAL LOW (ref 4.0–10.5)

## 2018-03-17 LAB — COMPREHENSIVE METABOLIC PANEL
ALT: 96 U/L — AB (ref 0–53)
AST: 129 U/L — AB (ref 0–37)
Albumin: 4.4 g/dL (ref 3.5–5.2)
Alkaline Phosphatase: 61 U/L (ref 39–117)
BILIRUBIN TOTAL: 0.4 mg/dL (ref 0.2–1.2)
BUN: 6 mg/dL (ref 6–23)
CO2: 26 mEq/L (ref 19–32)
CREATININE: 0.63 mg/dL (ref 0.40–1.50)
Calcium: 9.3 mg/dL (ref 8.4–10.5)
Chloride: 99 mEq/L (ref 96–112)
GFR: 140.74 mL/min (ref >60.00)
GLUCOSE: 95 mg/dL (ref 70–99)
Potassium: 4.1 mEq/L (ref 3.5–5.1)
SODIUM: 135 meq/L (ref 135–145)
TOTAL PROTEIN: 8.3 g/dL (ref 6.0–8.3)

## 2018-03-17 LAB — PROTIME-INR
INR: 1 ratio (ref 0.8–1.0)
PROTHROMBIN TIME: 12 s (ref 9.6–13.1)

## 2018-03-17 NOTE — Patient Instructions (Signed)
If you are age 54 or older, your body mass index should be between 23-30. Your Body mass index is 22.79 kg/m. If this is out of the aforementioned range listed, please consider follow up with your Primary Care Provider.  If you are age 64 or younger, your body mass index should be between 19-25. Your Body mass index is 22.79 kg/m. If this is out of the aformentioned range listed, please consider follow up with your Primary Care Provider.   Please go to the lab in the basement of our building to have lab work done as you leave today. Hit "B" for basement when you get on the elevator.  When the doors open the lab is on your left.  We will call you with the results. Thank you.  You have been scheduled for a colonoscopy. Please follow written instructions given to you at your visit today.  Please pick up your prep supplies at the pharmacy within the next 1-3 days. If you use inhalers (even only as needed), please bring them with you on the day of your procedure. Your physician has requested that you go to www.startemmi.com and enter the access code given to you at your visit today. This web site gives a general overview about your procedure. However, you should still follow specific instructions given to you by our office regarding your preparation for the procedure.  We are giving you a free sample of Plenvu today to use for your prep for your colonoscopy on Monday.  Thank you for entrusting me with your care and for choosing [facility], Dr. [place] Cellar

## 2018-03-17 NOTE — Progress Notes (Signed)
HPI :  52 year old male with a history of CDIP, numerous colon polyps, history of hepatitis C, referred here by Alvester Chou NP in consultation for symptoms of rectal bleeding.  He states he's had symptoms of rectal bleeding for at least 5 years. He has red blood in his bowel movements with almost every bowel movement, however extent of bleeding is variable, can be heavy at times or just noted on the toilet paper. Bleeding is bright red in color. He denies any constipation, he averages 2 bowel movements per day. He denies any perianal pain with the bleeding. He denies any abdominal pain. He does feel some symptoms of hemorrhoids in regards to swelling and prolapse of tissue at times, he's been using HIDA Cortizone suppositories more recently for this.  He states he's had numerous colon polyps removed in the past. Previously endorses 15 colon polyps that were precancerous removed at one point, his last colonoscopy was performed in 2017 at which time he had 3 adenomas removed, the largest was 1 cm in size, done at [facility]. He states using the emergency room for significant rectal bleeding in January 2019. A CT scan of the abdomen and pelvis at that time showed mild diffuse bowel wall thickening of the left colon. He denies any diarrhea. No constipation or straining routinely.   Otherwise he does have a history of hepatitis C for which he was treated with IFN and Ribavirin for one year, several years ago. He states this eradicated his hep C and does not have any known cirrhosis. He has been told he has had low platelets of unclear etiology in the past. He drinks about 24 oz of beer x 2 on a daily basis, remotely drank more heavily. He is otherwise followed for CDIP and has been on immunotherapy for that. He uses a wheelchair when he is out of his house but can bear weight and use the bathroom in his home. No FH of colon cancer.   Last colonoscopy 04/2015 - 3 polyps, largest 1cm in  sigmoid colon - excellent prep, done at [facility] - Dr. Newman Pies - path all c/w adenomas  CT scan 04/19/2017 - mild diffuse bowel wall thickening of the distal descending colon, sigmoid and rectum suggesting colitis   Past Medical History:  Diagnosis Date  . Anxiety   . Back pain   . CIDP (chronic inflammatory demyelinating polyneuropathy) (HCC)    does therapy at house every 3 weeks per patient. immunotherapy. Next one is 03/29/2018  . Colon polyp    said that they saw them but didn't removed them  . Depression   . Epilepsy, unspecified, not intractable, without status epilepticus ([place])   . Epilepsy, unspecified, not intractable, without status epilepticus ([place])   . GERD (gastroesophageal reflux disease)   . Hemorrhoids   . History of hepatitis C    did 12 month of therapy per patient   . Hypercholesteremia 03/2017   210  . Hyperlipidemia   . Neck pain   . Other acariasis    Hep C  . PTSD (post-traumatic stress disorder)   . Rectal bleeding   . Seizures ([place])   . Tubular adenoma of colon 2017     Past Surgical History:  Procedure Laterality Date  . COLONOSCOPY  2017   [facility]. Dr Newman Pies. Said they could see 15 polpys but didn't remove them per patient   . ESOPHAGOGASTRODUODENOSCOPY     in [place]. before 2009 per patient  .  HERNIA REPAIR     inguinal per patient   . JOINT REPLACEMENT     Knee  . TOTAL KNEE ARTHROPLASTY Bilateral    Family History  Problem Relation Age of Onset  . Cancer Mother 75       myosarcoma, ovarian  . Lung cancer Father   . Emphysema Father   . Colonic polyp Father   . Leukemia Sister 91  . Lung cancer Brother 46  . Brain cancer Brother   . Cancer Maternal Grandmother    Social History   Tobacco Use  . Smoking status: Current Every Day Smoker    Packs/day: 0.25    Years: 40.00    Pack years: 10.00  . Smokeless tobacco: Never Used  . Tobacco comment: smoking 5 cigs/day  Substance Use Topics  . Alcohol use:  Yes    Alcohol/week: 14.0 standard drinks    Types: 14 Cans of beer per week    Comment: pt states "2 24oz daily when I can"  . Drug use: Not Currently    Types: Marijuana   Current Outpatient Medications  Medication Sig Dispense Refill  . Cyanocobalamin (VITAMIN B-12 PO) Take by mouth.    . DULoxetine (CYMBALTA) 30 MG capsule Take 1 capsule (30 mg total) by mouth daily. 30 capsule 1  . folic acid (FOLVITE) 1 MG tablet Take 1 tablet (1 mg total) by mouth daily. 30 tablet 1  . levETIRAcetam (KEPPRA) 500 MG tablet Take 1 tablet (500 mg total) by mouth 2 (two) times daily. 60 tablet 5  . lisinopril (PRINIVIL,ZESTRIL) 5 MG tablet Take 5 mg by mouth daily.   5  . LORazepam (ATIVAN) 0.5 MG tablet TAKE 1 TABLET BY MOUTH DAILY  1  . Multiple Vitamin (MULTIVITAMIN WITH MINERALS) TABS tablet Take 1 tablet by mouth daily. 30 tablet 0  . omeprazole (PRILOSEC) 20 MG capsule Take 2 capsules (40 mg total) by mouth daily. 60 capsule 2  . pravastatin (PRAVACHOL) 40 MG tablet Take 1 tablet (40 mg total) by mouth daily. 90 tablet 3  . traMADol (ULTRAM) 50 MG tablet Take 50 mg by mouth 2 (two) times daily.   5  . traZODone (DESYREL) 100 MG tablet Take 1 tablet (100 mg total) by mouth at bedtime. (Patient taking differently: Take 100 mg by mouth at bedtime as needed for sleep. ) 30 tablet 3  . Thiamine HCl (VITAMIN B-1 PO) Take 1 tablet by mouth daily.     No current facility-administered medications for this visit.    Allergies  Allergen Reactions  . Codeine Other (See Comments)    Unknown childhood allergy     Review of Systems: All systems reviewed and negative except where noted in HPI.    No results found.  Physical Exam: BP 106/70   Pulse 93   Ht 5\' 9"  (1.753 m)   Wt 154 lb 5 oz (70 kg)   BMI 22.79 kg/m  Constitutional: Pleasant, male in no acute distress, sitting in wheelchair HEENT: Normocephalic and atraumatic. Conjunctivae are normal. No scleral icterus. Neck supple.    Cardiovascular: Normal rate, regular rhythm.  Pulmonary/chest: Effort normal and breath sounds normal. No wheezing, rales or rhonchi. Abdominal: Soft, nondistended, nontender. There are no masses palpable. No hepatomegaly. DRE deferred to colonoscopy Extremities: no edema Lymphadenopathy: No cervical adenopathy noted. Neurological: Alert and oriented to person place and time. Skin: Skin is warm and dry. No rashes noted. Psychiatric: Normal mood and affect. Behavior is normal.   ASSESSMENT AND  PLAN: 52 year old male here for new patient assessment of the following issues:  Rectal bleeding / hemorrhoids - long-standing intermittent bleeding symptoms which is most likely due to hemorrhoids.  He's had a mild anemia over time which has been stable, but will recheck Hgb today to ensure no interval change (last checked Jan 2019). He has a history of numerous colon polyps, he is due for a surveillance colonoscopy in about a month. Given his symptoms of bleeding recommend we perform colonoscopy now to ensure no other cause, survey his colon for polyps. I discussed risks and benefits of colonoscopy and he wanted to proceed. Pending his colonoscopy shows hemorrhoids as likely cause, I offered him hemorrhoid banding if he is deemed to be a candidate for that. We discussed that in detail and he was interested if a candidate. His colonoscopy is scheduled to be done within the next few days, rectal exam deferred to his colonoscopy in that light. He agreed.   History of hepatitis C / alcohol use - sounds like this has been eradicated years ago, although he has ongoing significant alcohol use. Recommend he cut back on his alcohol use. He reports a history of thrombocytopenia, will check platelet count today as well as LFTs and INR to assess for lab abnormalities concerning for cirrhosis. If there are any concerns for this will need imaging of his liver. Prior CT in January showed steatosis but no cirrhotic changes.    [place] Cellar, MD [facility]  CC: Alvester Chou, NP

## 2018-03-20 ENCOUNTER — Encounter: Payer: Self-pay | Admitting: Gastroenterology

## 2018-03-20 ENCOUNTER — Ambulatory Visit (AMBULATORY_SURGERY_CENTER): Payer: Medicaid Other | Admitting: Gastroenterology

## 2018-03-20 ENCOUNTER — Other Ambulatory Visit: Payer: Self-pay | Admitting: *Deleted

## 2018-03-20 VITALS — BP 134/83 | HR 88 | Temp 98.9°F | Resp 12 | Ht 69.0 in | Wt 154.0 lb

## 2018-03-20 DIAGNOSIS — D127 Benign neoplasm of rectosigmoid junction: Secondary | ICD-10-CM | POA: Diagnosis not present

## 2018-03-20 DIAGNOSIS — D124 Benign neoplasm of descending colon: Secondary | ICD-10-CM

## 2018-03-20 DIAGNOSIS — D123 Benign neoplasm of transverse colon: Secondary | ICD-10-CM | POA: Diagnosis not present

## 2018-03-20 DIAGNOSIS — D125 Benign neoplasm of sigmoid colon: Secondary | ICD-10-CM | POA: Diagnosis not present

## 2018-03-20 DIAGNOSIS — K625 Hemorrhage of anus and rectum: Secondary | ICD-10-CM

## 2018-03-20 DIAGNOSIS — R748 Abnormal levels of other serum enzymes: Secondary | ICD-10-CM

## 2018-03-20 MED ORDER — SODIUM CHLORIDE 0.9 % IV SOLN: 500.0000 mL | Freq: Once | INTRAVENOUS | Status: DC

## 2018-03-20 NOTE — Progress Notes (Signed)
Called to room to assist during endoscopic procedure.  Patient ID and intended procedure confirmed with present staff. Received instructions for my participation in the procedure from the performing physician.  

## 2018-03-20 NOTE — Progress Notes (Signed)
Report given to PACU, vss 

## 2018-03-20 NOTE — Patient Instructions (Signed)
Handouts given for Polyps, Hemorrhoids and Diverticulosis.   YOU HAD AN ENDOSCOPIC PROCEDURE TODAY AT [facility]:   Refer to the procedure report that was given to you for any specific questions about what was found during the examination.  If the procedure report does not answer your questions, please call your gastroenterologist to clarify.  If you requested that your care partner not be given the details of your procedure findings, then the procedure report has been included in a sealed envelope for you to review at your convenience later.  YOU SHOULD EXPECT: Some feelings of bloating in the abdomen. Passage of more gas than usual.  Walking can help get rid of the air that was put into your GI tract during the procedure and reduce the bloating. If you had a lower endoscopy (such as a colonoscopy or flexible sigmoidoscopy) you may notice spotting of blood in your stool or on the toilet paper. If you underwent a bowel prep for your procedure, you may not have a normal bowel movement for a few days.  Please Note:  You might notice some irritation and congestion in your nose or some drainage.  This is from the oxygen used during your procedure.  There is no need for concern and it should clear up in a day or so.  SYMPTOMS TO REPORT IMMEDIATELY:   Following lower endoscopy (colonoscopy or flexible sigmoidoscopy):  Excessive amounts of blood in the stool  Significant tenderness or worsening of abdominal pains  Swelling of the abdomen that is new, acute  Fever of 100F or higher   For urgent or emergent issues, a gastroenterologist can be reached at any hour by calling [phone].   DIET:  We do recommend a small meal at first, but then you may proceed to your regular diet.  Drink plenty of fluids but you should avoid alcoholic beverages for 24 hours.  ACTIVITY:  You should plan to take it easy for the rest of today and you should NOT DRIVE or use heavy machinery until  tomorrow (because of the sedation medicines used during the test).    FOLLOW UP: Our staff will call the number listed on your records the next business day following your procedure to check on you and address any questions or concerns that you may have regarding the information given to you following your procedure. If we do not reach you, we will leave a message.  However, if you are feeling well and you are not experiencing any problems, there is no need to return our call.  We will assume that you have returned to your regular daily activities without incident.  If any biopsies were taken you will be contacted by phone or by letter within the next 1-3 weeks.  Please call us at [phone] if you have not heard about the biopsies in 3 weeks.    SIGNATURES/CONFIDENTIALITY: You and/or your care partner have signed paperwork which will be entered into your electronic medical record.  These signatures attest to the fact that that the information above on your After Visit Summary has been reviewed and is understood.  Full responsibility of the confidentiality of this discharge information lies with you and/or your care-partner.

## 2018-03-20 NOTE — Op Note (Signed)
[name] Procedure Date: 03/20/2018 11:24 AM MRN: [id number] Endoscopist: Remo Lipps P. Havery Moros , MD Age: 52 Referring MD:  Date of Birth: February 14, 1966 Gender: Male Account #: [id number] Procedure:                Colonoscopy Indications:              Rectal bleeding, history of colon polyps Medicines:                Monitored Anesthesia Care Procedure:                Pre-Anesthesia Assessment:                           - Prior to the procedure, a History and Physical                            was performed, and patient medications and                            allergies were reviewed. The patient's tolerance of                            previous anesthesia was also reviewed. The risks                            and benefits of the procedure and the sedation                            options and risks were discussed with the patient.                            All questions were answered, and informed consent                            was obtained. Prior Anticoagulants: The patient has                            taken no previous anticoagulant or antiplatelet                            agents. ASA Grade Assessment: III - A patient with                            severe systemic disease. After reviewing the risks                            and benefits, the patient was deemed in                            satisfactory condition to undergo the procedure.                           After obtaining informed consent, the colonoscope  was passed under direct vision. Throughout the                            procedure, the patient's blood pressure, pulse, and                            oxygen saturations were monitored continuously. The                            Colonoscope was introduced through the anus and                            advanced to the the terminal ileum, with                            identification of  the appendiceal orifice and IC                            valve. The colonoscopy was performed without                            difficulty. The patient tolerated the procedure                            well. The quality of the bowel preparation was                            good. The terminal ileum, ileocecal valve,                            appendiceal orifice, and rectum were photographed. Scope In: 11:28:33 AM Scope Out: 12:03:36 PM Scope Withdrawal Time: 0 hours 29 minutes 6 seconds  Total Procedure Duration: 0 hours 35 minutes 3 seconds  Findings:                 Hemorrhoids were found on perianal exam.                           The terminal ileum appeared normal.                           Two sessile polyps were found in the transverse                            colon. The polyps were 3 to 4 mm in size. These                            polyps were removed with a cold snare. Resection                            and retrieval were complete.                           A 7 mm polyp was found in the descending colon.  The                            polyp was sessile. The polyp was removed with a                            cold snare. Resection and retrieval were complete.                           Two sessile polyps were found in the descending                            colon. The polyps were diminutive in size. These                            polyps were removed with a cold biopsy forceps.                            Resection and retrieval were complete.                           Three sessile polyps were found in the sigmoid                            colon. The polyps were 2 to 5 mm in size. These                            polyps were removed with a cold snare. Resection                            and retrieval were complete.                           A 6 mm polyp was found in the sigmoid colon. The                            polyp was pedunculated. The polyp was removed with                             a hot snare. Resection and retrieval were complete.                           Two sessile polyps were found in the recto-sigmoid                            colon. The polyps were 3 to 5 mm in size. These                            polyps were removed with a cold snare. Resection                            and retrieval were complete.  Multiple small-mouthed diverticula were found in                            the sigmoid colon.                           Internal hemorrhoids were found during                            retroflexion. The hemorrhoids were moderate.                           The exam was otherwise without abnormality. The IC                            valve was normal, photo was not taken accidentally. Complications:            No immediate complications. Estimated blood loss:                            Minimal. Estimated Blood Loss:     Estimated blood loss was minimal. Impression:               - Hemorrhoids found on perianal exam.                           - The examined portion of the ileum was normal.                           - Two 3 to 4 mm polyps in the transverse colon,                            removed with a cold snare. Resected and retrieved.                           - One 7 mm polyp in the descending colon, removed                            with a cold snare. Resected and retrieved.                           - Two diminutive polyps in the descending colon,                            removed with a cold biopsy forceps. Resected and                            retrieved.                           - Three 2 to 5 mm polyps in the sigmoid colon,                            removed with a cold snare. Resected and retrieved.                           -  One 6 mm polyp in the sigmoid colon, removed with                            a hot snare. Resected and retrieved.                           - Two 3 to 5 mm polyps at the  recto-sigmoid colon,                            removed with a cold snare. Resected and retrieved.                           - Diverticulosis in the sigmoid colon.                           - Internal hemorrhoids, inflamed.                           - The examination was otherwise normal.                           Hemorrhoids are the cause of the patient's bleeding. Recommendation:           - Patient has a contact number available for                            emergencies. The signs and symptoms of potential                            delayed complications were discussed with the                            patient. Return to normal activities tomorrow.                            Written discharge instructions were provided to the                            patient.                           - Resume previous diet.                           - Continue present medications.                           - Await pathology results.                           - Follow up in the office for hemorrhoid banding                            procedure to treat internal hemorrhoids. Remo Lipps P. Armbruster, MD 03/20/2018 12:14:08 PM This report has been signed electronically.

## 2018-03-20 NOTE — Progress Notes (Signed)
Last seizure 02/06/18. Informed Cody Palau CRNA and Dr Wilma Flavin by [name] RN examined by Cody Palau CRNA Cleared for procedure. Patient will take bus home, informed recovery nurse and [name] RN

## 2018-03-21 ENCOUNTER — Telehealth: Payer: Self-pay | Admitting: *Deleted

## 2018-03-21 ENCOUNTER — Telehealth: Payer: Self-pay

## 2018-03-21 NOTE — Telephone Encounter (Signed)
Pt calling back and states he is doing great.

## 2018-03-21 NOTE — Telephone Encounter (Signed)
-----   Message from Cody Flock, MD sent at 03/20/2018 12:37 PM EST ----- Regarding: banding Jan can you book this patient for a routine hemorrhoid banding? Thanks! Dr. Loni Muse

## 2018-03-21 NOTE — Telephone Encounter (Signed)
  Follow up Call-  Call back number 03/20/2018  Post procedure Call Back phone  # [phone], room 45  Permission to leave phone message Yes  Some recent data might be hidden     Patient questions:  Do you have a fever, pain , or abdominal swelling? No. Pain Score  0 *  Have you tolerated food without any problems? Yes.    Have you been able to return to your normal activities? Yes.    Do you have any questions about your discharge instructions: Diet   No. Medications  No. Follow up visit  No.  Do you have questions or concerns about your Care? No.  Actions: * If pain score is 4 or above: No action needed, pain <4.

## 2018-03-21 NOTE — Telephone Encounter (Signed)
LM for pt to call back to schedule banding appts.

## 2018-03-21 NOTE — Telephone Encounter (Signed)
  Follow up Call-  Call back number 03/20/2018  Post procedure Call Back phone  # [phone], room 40  Permission to leave phone message Yes  Some recent data might be hidden     Patient questions:  Do you have a fever, pain , or abdominal swelling? No. Pain Score  0 *  Have you tolerated food without any problems? Yes.    Have you been able to return to your normal activities? Yes.    Do you have any questions about your discharge instructions: Diet   No. Medications  No. Follow up visit  No.  Do you have questions or concerns about your Care? No.  Actions: * If pain score is 4 or above: No action needed, pain <4.

## 2018-03-23 ENCOUNTER — Other Ambulatory Visit: Payer: Self-pay

## 2018-03-23 DIAGNOSIS — R748 Abnormal levels of other serum enzymes: Secondary | ICD-10-CM

## 2018-03-23 NOTE — Telephone Encounter (Signed)
Cody Delacruz has a call into the pt about another matter and will discuss banding appts when he calls her back.

## 2018-03-23 NOTE — Progress Notes (Signed)
Lab work orders entered per MD recommendations;

## 2018-03-23 NOTE — Telephone Encounter (Signed)
Left another message for pt to call back and schedule bandings

## 2018-03-24 ENCOUNTER — Telehealth: Payer: Self-pay

## 2018-03-24 NOTE — Telephone Encounter (Signed)
-----   Message from Yetta Flock, MD sent at 03/24/2018  7:51 AM EST ----- Yes that's fine, complete US okay, thanks for asking

## 2018-03-27 ENCOUNTER — Encounter: Payer: Self-pay | Admitting: Gastroenterology

## 2018-03-27 ENCOUNTER — Telehealth: Payer: Self-pay | Admitting: Gastroenterology

## 2018-03-27 ENCOUNTER — Ambulatory Visit (HOSPITAL_COMMUNITY): Admission: RE | Admit: 2018-03-27 | Payer: Medicaid Other | Source: Ambulatory Visit

## 2018-03-27 NOTE — Telephone Encounter (Signed)
Called and spoke with patient- patient reports he "did not know about the ultrasound but I called back to the office and told them I was feeling fine and asked if there was anything else I was supposed to do and they told me no"; patient was informed of MD recommendations concerning elevated liver enzymes and the need for additional blood work; patient was given the number [phone] in order to reschedule his missed appt for the ultrasound; patient aware of the need for the blood work and the ultrasound in order for the MD to continue to provider care to him; Patient verbalized understanding of information/instructions; Patient was advised to call back if questions/concerns arise;

## 2018-03-27 NOTE — Telephone Encounter (Signed)
Pt returned your call and would like another call at his home phone [phone], ext [phone].

## 2018-03-31 ENCOUNTER — Ambulatory Visit: Payer: Medicaid Other | Admitting: Gastroenterology

## 2018-03-31 ENCOUNTER — Encounter: Payer: Self-pay | Admitting: Gastroenterology

## 2018-03-31 ENCOUNTER — Other Ambulatory Visit: Payer: Medicaid Other

## 2018-03-31 VITALS — BP 108/74 | HR 68 | Ht 69.0 in | Wt 155.0 lb

## 2018-03-31 DIAGNOSIS — Z8619 Personal history of other infectious and parasitic diseases: Secondary | ICD-10-CM

## 2018-03-31 DIAGNOSIS — K642 Third degree hemorrhoids: Secondary | ICD-10-CM | POA: Diagnosis not present

## 2018-03-31 DIAGNOSIS — R748 Abnormal levels of other serum enzymes: Secondary | ICD-10-CM

## 2018-03-31 DIAGNOSIS — Z789 Other specified health status: Secondary | ICD-10-CM

## 2018-03-31 DIAGNOSIS — Z7289 Other problems related to lifestyle: Secondary | ICD-10-CM | POA: Diagnosis not present

## 2018-03-31 NOTE — Progress Notes (Signed)
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  He's had ongoing rectal bleeding for years. He had a colonoscopy with me on 03/20/18 confirming that internal hemmorhoids are the cause for his symptoms. He also had numerous adenomas and is due for repeat colonoscopy in 3 years. He has grade III hemorrhoids, he has to periodically reduce the prolapse. He is requesting therapy and hoping to proceed with banding but is concerned about his bleeding risk.   He otherwise was noted to have abnormal liver enzymes on more recent labs. AST 129, ALT 96, normal AP and bili. INR 1.0. His platelets are in the 90s. I recommended an [place] to assess for cirrhosis. This is scheduled for next week. He also is due for follow up labs to ensure no other evidence of chronic liver diseases. He drinks about 24 oz of beer x 2 on a daily basis, remotely drank more heavily. Otherwise he does have a history of hepatitis C for which he was treated with IFN and Ribavirin for one year, several years ago. He states this eradicated his hep C and does not have any known cirrhosis.  Colonoscopy 03/20/18 - 11 polyps removed, most adenomas, internal hemorrhoids Colonoscopy 04/2015 - 3 polyps, largest 1cm in sigmoid colon - excellent prep, done at [facility] - Dr. Newman Pies - path all c/w adenomas  CT scan 04/19/2017 - mild diffuse bowel wall thickening of the distal descending colon, sigmoid and rectum suggesting colitis  Past Medical History:  Diagnosis Date  . Anxiety   . Arthritis   . Back pain   . CIDP (chronic inflammatory demyelinating polyneuropathy) (HCC)    does therapy at house every 3 weeks per patient. immunotherapy. Next one is 03/29/2018  . Colon polyp    said that they saw them but didn't removed them  . Depression   . Epilepsy, unspecified, not intractable, without status epilepticus ([place])   .  Epilepsy, unspecified, not intractable, without status epilepticus ([place])   . GERD (gastroesophageal reflux disease)   . Hemorrhoids   . History of hepatitis C    did 12 month of therapy per patient   . Hypercholesteremia 03/2017   210  . Hyperlipidemia   . Hypertension   . Neck pain   . Other acariasis    Hep C  . PTSD (post-traumatic stress disorder)   . Rectal bleeding   . Seizures ([place])   . Tubular adenoma of colon 2017     Past Surgical History:  Procedure Laterality Date  . COLONOSCOPY  2017   [facility]. Dr Newman Pies. Said they could see 15 polpys but didn't remove them per patient   . ESOPHAGOGASTRODUODENOSCOPY     in [place]. before 2009 per patient  . ganglion cyst removal    . HERNIA REPAIR     inguinal per patient   . JOINT REPLACEMENT     Knee  . TOTAL KNEE ARTHROPLASTY Bilateral    Family History  Problem Relation Age of Onset  . Cancer Mother 74       myosarcoma, ovarian  . Lung cancer Father   . Emphysema Father   . Colonic polyp Father   . Leukemia Sister 37  . Lung cancer Brother 86  . Brain cancer Brother   . Cancer Maternal Grandmother   . Colon cancer Neg Hx   . Colon polyps  Neg Hx   . Esophageal cancer Neg Hx   . Rectal cancer Neg Hx   . Stomach cancer Neg Hx    Social History   Tobacco Use  . Smoking status: Current Every Day Smoker    Packs/day: 0.25    Years: 40.00    Pack years: 10.00  . Smokeless tobacco: Never Used  . Tobacco comment: smoking 5 cigs/day  Substance Use Topics  . Alcohol use: Yes    Alcohol/week: 0.0 standard drinks    Comment: pt states "2 24oz daily when I can"  . Drug use: Not Currently    Types: Marijuana   Current Outpatient Medications  Medication Sig Dispense Refill  . Cyanocobalamin (VITAMIN B-12 PO) Take by mouth.    . DULoxetine (CYMBALTA) 30 MG capsule Take 1 capsule (30 mg total) by mouth daily. 30 capsule 1  . folic acid (FOLVITE) 1 MG tablet Take 1 tablet (1 mg total) by mouth daily. 30  tablet 1  . Immune Globulin, Human, 0.5 GM/10ML SOLN Inject into the vein once a week.    . levETIRAcetam (KEPPRA) 500 MG tablet Take 1 tablet (500 mg total) by mouth 2 (two) times daily. 60 tablet 5  . lisinopril (PRINIVIL,ZESTRIL) 5 MG tablet Take 5 mg by mouth daily.   5  . LORazepam (ATIVAN) 0.5 MG tablet TAKE 1 TABLET BY MOUTH DAILY  1  . Multiple Vitamin (MULTIVITAMIN WITH MINERALS) TABS tablet Take 1 tablet by mouth daily. 30 tablet 0  . omeprazole (PRILOSEC) 20 MG capsule Take 2 capsules (40 mg total) by mouth daily. 60 capsule 2  . pravastatin (PRAVACHOL) 40 MG tablet Take 1 tablet (40 mg total) by mouth daily. 90 tablet 3  . Thiamine HCl (VITAMIN B-1 PO) Take 1 tablet by mouth daily.    . Thiamine HCl (VITAMIN B-1) 250 MG tablet Take 250 mg by mouth daily.    . traMADol (ULTRAM) 50 MG tablet Take 50 mg by mouth 2 (two) times daily.   5  . traZODone (DESYREL) 100 MG tablet Take 1 tablet (100 mg total) by mouth at bedtime. (Patient taking differently: Take 100 mg by mouth at bedtime as needed for sleep. ) 30 tablet 3   No current facility-administered medications for this visit.    Allergies  Allergen Reactions  . Codeine Other (See Comments)    Unknown childhood allergy     Review of Systems: All systems reviewed and negative except where noted in HPI.   Lab Results  Component Value Date   WBC 3.8 (L) 03/17/2018   HGB 14.2 03/17/2018   HCT 41.0 03/17/2018   MCV 97.6 03/17/2018   PLT 98.0 (L) 03/17/2018    Lab Results  Component Value Date   CREATININE 0.63 03/17/2018   BUN 6 03/17/2018   NA 135 03/17/2018   K 4.1 03/17/2018   CL 99 03/17/2018   CO2 26 03/17/2018    Lab Results  Component Value Date   ALT 96 (H) 03/17/2018   AST 129 (H) 03/17/2018   ALKPHOS 61 03/17/2018   BILITOT 0.4 03/17/2018    Lab Results  Component Value Date   INR 1.0 03/17/2018   INR 1.04 11/18/2016     Physical Exam: BP 108/74   Pulse 68   Ht 5\' 9"  (1.753 m)   Wt 155 lb  (70.3 kg)   BMI 22.89 kg/m  Constitutional: Pleasant,male in no acute distress, in wheelchair. HEENT: Normocephalic and atraumatic. Conjunctivae are normal. No scleral  icterus. Neck supple.  Cardiovascular: Normal rate, regular rhythm.  Pulmonary/chest: Effort normal and breath sounds normal. No wheezing, rales or rhonchi. Abdominal: Soft, nondistended, nontender.  There are no masses palpable. No hepatomegaly. Extremities: no edema Lymphadenopathy: No cervical adenopathy noted. Neurological: Alert and oriented to person place and time. Skin: Skin is warm and dry. No rashes noted. Psychiatric: Normal mood and affect. Behavior is normal.   ASSESSMENT AND PLAN: 53 year old male here for reassessment of the following issues:   Grade 3 hemorrhoids - ongoing symptoms which are long-standing. I think he would benefit from hemorrhoid banding therapy. We discussed risks and benefits of this. Given his thrombocytopenia and history of liver disease, it raises the question for possible cirrhosis currently. This may increase his risk for bleeding from hemorrhoid banding if he has cirrhosis. He is anxious about the bleeding risk, we discussed options. We'll await his ultrasound and follow-up labs to assess for presence of cirrhosis prior to proceeding. Hemorrhoid symptoms are stable at this time. He was agreeable with the plan.  Elevated liver enzymes / alcohol use / history of hepatitis C - I think alcohol is driving his elevated liver enzymes. We discussed his alcohol use and I recommended he abstain completely. His thrombocytopenia raises the question for underlying cirrhosis. He is scheduled for ultrasound of his liver next week. I will also send basic labs to ensure no other evidence of chronic liver diseases. We will also repeat hepatitis C RNA to ensure negative, and check for immunity to hepatitis A and B. He agreed  Carteret Cellar, MD [facility]

## 2018-04-03 ENCOUNTER — Ambulatory Visit (HOSPITAL_COMMUNITY)
Admission: RE | Admit: 2018-04-03 | Discharge: 2018-04-03 | Disposition: A | Discharge status: Home / Self Care | Payer: Medicaid Other | Source: Ambulatory Visit | Attending: Gastroenterology | Admitting: Gastroenterology

## 2018-04-03 ENCOUNTER — Telehealth: Payer: Self-pay | Admitting: *Deleted

## 2018-04-03 ENCOUNTER — Telehealth: Payer: Self-pay | Admitting: Neurology

## 2018-04-03 DIAGNOSIS — R748 Abnormal levels of other serum enzymes: Secondary | ICD-10-CM | POA: Insufficient documentation

## 2018-04-03 LAB — HEPATITIS C RNA QUANTITATIVE
HCV Quantitative Log: <1.18 Log IU/mL
HCV RNA, PCR, QN: <15 IU/mL

## 2018-04-03 LAB — IRON, TOTAL/TOTAL IRON BINDING CAP
%SAT: 14 % (calc) — ABNORMAL LOW (ref 20–48)
Iron: 44 ug/dL — ABNORMAL LOW (ref 50–180)
TIBC: 323 mcg/dL (calc) (ref 250–425)

## 2018-04-03 LAB — HEPATITIS A ANTIBODY, TOTAL: Hepatitis A AB,Total: REACTIVE — AB

## 2018-04-03 LAB — HEPATITIS B SURFACE ANTIGEN: Hepatitis B Surface Ag: NONREACTIVE

## 2018-04-03 LAB — FERRITIN: Ferritin: 61 ng/mL (ref 38–380)

## 2018-04-03 LAB — HEPATITIS B SURFACE ANTIBODY,QUALITATIVE: Hep B S Ab: REACTIVE — AB

## 2018-04-03 NOTE — Telephone Encounter (Signed)
Janett Billow (the infusion nurse) called to let us know that patient is not doing well and is actually getting weaker.  She is requesting to increase dose or frequency.  Please advise.  [phone]

## 2018-04-03 NOTE — Telephone Encounter (Signed)
It would be best to reassess him in the office, because it is very likely that he is still drinking alcohol which is causing his worsening neuropathy.  Keep IVIG as scheduled.  Donika K. Posey Pronto, DO

## 2018-04-03 NOTE — Telephone Encounter (Signed)
"  yOUR FAVORITE PERSON" PER YOUR PATIENT IS NEEDING PRESCRIPTION FOR THE NUMBING SPRAY FOR HIS INFUSIONS BECAUSE THEY USE A RAKE NEEDLE IN IT AND ITS TOUGH ON HIM. pLEASE [place] AT [phone]. THANKS!

## 2018-04-04 NOTE — Telephone Encounter (Signed)
Patient coming in on Thursday.

## 2018-04-04 NOTE — Telephone Encounter (Signed)
Patient coming in on Thursday.  Will discuss then.

## 2018-04-06 ENCOUNTER — Encounter: Payer: Self-pay | Admitting: Neurology

## 2018-04-06 ENCOUNTER — Ambulatory Visit: Payer: Medicaid Other | Admitting: Neurology

## 2018-04-06 VITALS — BP 120/80 | HR 85 | Ht 69.0 in | Wt 154.0 lb

## 2018-04-06 DIAGNOSIS — G621 Alcoholic polyneuropathy: Secondary | ICD-10-CM

## 2018-04-06 DIAGNOSIS — G8314 Monoplegia of lower limb affecting left nondominant side: Secondary | ICD-10-CM | POA: Diagnosis not present

## 2018-04-06 DIAGNOSIS — G40909 Epilepsy, unspecified, not intractable, without status epilepticus: Secondary | ICD-10-CM

## 2018-04-06 DIAGNOSIS — G6181 Chronic inflammatory demyelinating polyneuritis: Secondary | ICD-10-CM

## 2018-04-06 NOTE — Progress Notes (Signed)
Follow-up Visit   Date: 04/06/18    Cody Delacruz MRN: [id number] DOB: 01-10-1966   Interim History: Cody Delacruz is a 53 y.o. right-handed Caucasian male with alcohol abuse, tobacco use, depression, seizure disorder, GERD, alcoholic cirrhosis, and hyperlipidemia returning to the clinic for follow-up of polyradiculoneuropathy.  The patient was accompanied to the clinic by self.  History of present illness: Starting around early May 2019, he began having numbness and tingling of the feet and lower legs up to the level of the knees.  Over the next four weeks, he steadily developed greater difficulty with balance, weakness of the legs, and started having increased falls. He is falling 1-2 times per week and has sustained superficial injuries.  He usually crawls to something to pull himself up.  He has been using a cane since 2011.  He was drinking (2) 24 oz beers daily x 10 years.  He usually uses a wheelchair because of chronic low back pain for the past two years.    He has history of generalized seizures which started in 2001.  He takes depakote 537m three times.  He is noncompliant with medications.  He last seizure was 2 months ago.  He typically gets seizure once per month which lasts 5 minutes.  The last time he saw a neurologist [id number]. He reports being toxic on a previous AED, but does not recall the name.  He does not drive.    UPDATE 03/06/2018:  In August 2019, he has NCS/EMG and CSF testing which was favoring polyradiculoneuropathy and therefore he was treated with IVIG.  He has noticed that he can dorsiflex the left > right foot better.  No change in numbness of the feet and he now has some numbness of the hands.  He continues to drink 2 beers daily. He has one ER visit in September with a fall and fractured his ribs.  He has been more compliant with taking depakote 5016mTID and noticed reduced frequency of seizures.  He had had 2 since  his last visit, where as previously occurring every month.   UPDATE 04/06/2018:  He scheduled sooner visit because of increased falls and inability to raise his left leg.  He developed weakness of the left leg about a week ago, there was no pain associated with it.  He also has some incontinence.  He has been getting IVIG for the past 4 months and he has not noticed any benefit.  He has reduced his alcohol intake since his last visit.  He was told he has early signs of cirrhosis.  He has been doing well on keppra with no interval seizures.  VPA was stopped due to low platelet count.   Medications:  Current Outpatient Medications on File Prior to Visit  Medication Sig Dispense Refill  . Cyanocobalamin (VITAMIN B-12 PO) Take by mouth.    . DULoxetine (CYMBALTA) 30 MG capsule Take 1 capsule (30 mg total) by mouth daily. 30 capsule 1  . folic acid (FOLVITE) 1 MG tablet Take 1 tablet (1 mg total) by mouth daily. 30 tablet 1  . Immune Globulin, Human, 0.5 GM/10ML SOLN Inject into the vein once a week.    . levETIRAcetam (KEPPRA) 500 MG tablet Take 1 tablet (500 mg total) by mouth 2 (two) times daily. 60 tablet 5  . lisinopril (PRINIVIL,ZESTRIL) 5 MG tablet Take 5 mg by mouth daily.   5  . LORazepam (ATIVAN) 0.5 MG  tablet TAKE 1 TABLET BY MOUTH DAILY  1  . Multiple Vitamin (MULTIVITAMIN WITH MINERALS) TABS tablet Take 1 tablet by mouth daily. 30 tablet 0  . omeprazole (PRILOSEC) 20 MG capsule Take 2 capsules (40 mg total) by mouth daily. 60 capsule 2  . pravastatin (PRAVACHOL) 40 MG tablet Take 1 tablet (40 mg total) by mouth daily. 90 tablet 3  . Thiamine HCl (VITAMIN B-1 PO) Take 1 tablet by mouth daily.    . Thiamine HCl (VITAMIN B-1) 250 MG tablet Take 250 mg by mouth daily.    . traMADol (ULTRAM) 50 MG tablet Take 50 mg by mouth 2 (two) times daily.   5  . traZODone (DESYREL) 100 MG tablet Take 1 tablet (100 mg total) by mouth at bedtime. (Patient taking differently: Take 100 mg by mouth at bedtime  as needed for sleep. ) 30 tablet 3   No current facility-administered medications on file prior to visit.     Allergies:  Allergies  Allergen Reactions  . Codeine Other (See Comments)    Unknown childhood allergy    Review of Systems:  CONSTITUTIONAL: No fevers, chills, night sweats, or weight loss.  EYES: No visual changes or eye pain ENT: No hearing changes.  No history of nose bleeds.   RESPIRATORY: No cough, wheezing and shortness of breath.   CARDIOVASCULAR: Negative for chest pain, and palpitations.   GI: Negative for abdominal discomfort, blood in stools or black stools.  No recent change in bowel habits.   GU:  No history of incontinence.   MUSCLOSKELETAL: +history of joint pain or swelling.  No myalgias.   SKIN: Negative for lesions, rash, and itching.   ENDOCRINE: Negative for cold or heat intolerance, polydipsia or goiter.   PSYCH:  No depression or anxiety symptoms.   NEURO: As Above.   Vital Signs:  BP 120/80   Pulse 85   Ht '5\' 9"'  (1.753 m)   Wt 154 lb (69.9 kg)   SpO2 97%   BMI 22.74 kg/m    General Medical Exam:   General:  comfortable, sitting in wheelchair Eyes/ENT: see cranial nerve examination.   Neck:   No carotid bruits. Respiratory:  Clear to auscultation, good air entry bilaterally.   Cardiac:  Regular rate and rhythm, no murmur.   Ext:  Severe lower leg atrophy  Neurological Exam: MENTAL STATUS including orientation to time, place, person, recent and remote memory, attention span and concentration, language, and fund of knowledge is normal.  Speech is not dysarthric.  CRANIAL NERVES:    Face is symmetric.  MOTOR:  Severe TA atrophy, no fasciculations or abnormal movements.  No pronator drift.  Tone is normal.    Right Upper Extremity:    Left Upper Extremity:    Deltoid  5/5   Deltoid  5/5   Biceps  5/5   Biceps  5/5   Triceps  5/5   Triceps  5/5   Wrist extensors  5/5   Wrist extensors  5/5   Wrist flexors  5/5   Wrist flexors  5/5     Finger extensors  5/5   Finger extensors  5/5   Finger flexors  5/5   Finger flexors  5/5   Dorsal interossei  5/5   Dorsal interossei  5/5   Abductor pollicis  5/5   Abductor pollicis  5/5   Tone (Ashworth scale)  0  Tone (Ashworth scale)  0   Right Lower Extremity:    Left Lower  Extremity:    Hip flexors  5/5   Hip flexors  2/5   Hip extensors  5/5   Hip extensors  4/5   Knee flexors  5/5   Knee flexors  3/5   Knee extensors  5/5   Knee extensors  3/5   Dorsiflexors  3/5   Dorsiflexors  3/5   Plantarflexors  3/5   Plantarflexors  3/5   Toe extensors  2/5   Toe extensors  2/5   Toe flexors  2/5   Toe flexors  2/5   Tone (Ashworth scale)  0  Tone (Ashworth scale)  0   MSRs:  Reflexes are 2+/4 throughout, absent at the achilles bilaterally.  SENSORY:  Absent vibration distal to ankles bilaterally  COORDINATION/GAIT:  Gait not assessed due to the severity of his left leg weakness.   Data: MRI lumbar spine wo contrast 08/18/2017: 1. Degenerative change of the lumbar spine without fracture or malalignment. 2. No canal stenosis. Mild to moderate RIGHT L5-S1 neural foraminal narrowing.  Labs 08/18/2017:  Vitamin B12 498, TSH 1.159, RPR neg, SPEP with IFE no M protein Labs 10/12/2017:  VPA <12, MMA 60, copper 70, vitamin B1 9, folate 16.5, ESR 8, CRP 0.1  NCS/EMG of the legs 10/25/2017:  The electrophysiologic findings are most consistent with a subacute and severe common peroneal neuropathy, demyelinating and axon loss in type, affecting bilateral lower extremities. Alternatively, a polyradiculoneuropathy can be considered given abnormal late responses.  Correlate clinically. There is no evidence of a large fiber sensorimotor polyneuropathy.  CSF 11/14/2017:  R0 W0 P70 G67  No OCB, IgG index 0.44  Lyme neg, cytology neg  Lab Results  Component Value Date   CHOL 196 05/19/2016   HDL 60 05/19/2016   LDLCALC 105 (H) 05/19/2016   TRIG 154 (H) 05/19/2016   CHOLHDL 3.3 05/19/2016      IMPRESSION/PLAN: 1.  Left proximal leg weakness - new.  I am not sure what has caused this abrupt change in his exam, as his proximal strength was previously 5/5. I will plan to get ASAP CT head to look for signs of ACA territory stroke, especially since onset was sudden.  If this is negative, the next step is MRI lumbar spine wwo contrast.  2.  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  He has been treated with IVIG for the past 3 months and has some improvement with dorsiflexion.  Continue monthly IVIG x 6 months.  If there is no improvement at 6 months, it is reasonable to discontinue IVIG as his neuropathy may be moreso associated with thiamine deficiency and neurotoxicity from alcohol use, than truly inflammatory-mediated.  Continue home PT/OT exercises. Avoid ambulating, advised to use a wheelchair at all times  3.  Seizure disorder.  No interval seizures since his last visit.  He was previously on depakote which was discontinued due to platelet dysfunction and now takes Keppra 515m twice daily.  4.  History of alcohol abuse.  Stopped alcohol use in late 2019, abstinence was strongly encouraged.     5.  Thiamine deficiency.  Continue vitamin B1 1037mdaily.  Further recommendations pending results  Thank you for allowing me to participate in patient's care.  If I can answer any additional questions, I would be pleased to do so.    Sincerely,    Donika K. PaPosey ProntoDO

## 2018-04-06 NOTE — Patient Instructions (Addendum)
MRI lumbar spine without contrast  Continue IVIG every 3 weeks  Try to use your wheelchair at all times  Home physical therapy  Continue keppra 500mg  twice daily  Continue to abstain from alcohol   We will call you with the results

## 2018-04-07 ENCOUNTER — Other Ambulatory Visit: Payer: Self-pay | Admitting: *Deleted

## 2018-04-07 DIAGNOSIS — R29898 Other symptoms and signs involving the musculoskeletal system: Secondary | ICD-10-CM

## 2018-04-07 NOTE — Progress Notes (Signed)
Patient is already scheduled for CT on 04/21/2018.  I requested a sooner appointment but he can't due to his schedule.

## 2018-04-12 ENCOUNTER — Encounter: Payer: Self-pay | Admitting: Gastroenterology

## 2018-04-12 ENCOUNTER — Ambulatory Visit (INDEPENDENT_AMBULATORY_CARE_PROVIDER_SITE_OTHER): Payer: Medicaid Other | Admitting: Gastroenterology

## 2018-04-12 VITALS — BP 118/78 | HR 68 | Ht 69.0 in | Wt 156.2 lb

## 2018-04-12 DIAGNOSIS — K642 Third degree hemorrhoids: Secondary | ICD-10-CM | POA: Diagnosis not present

## 2018-04-12 DIAGNOSIS — K7460 Unspecified cirrhosis of liver: Secondary | ICD-10-CM | POA: Diagnosis not present

## 2018-04-12 DIAGNOSIS — Z7289 Other problems related to lifestyle: Secondary | ICD-10-CM | POA: Diagnosis not present

## 2018-04-12 DIAGNOSIS — Z8619 Personal history of other infectious and parasitic diseases: Secondary | ICD-10-CM

## 2018-04-12 DIAGNOSIS — Z789 Other specified health status: Secondary | ICD-10-CM

## 2018-04-12 NOTE — Progress Notes (Signed)
HPI :  53 y/o male with a history of hep C, routine alcohol use, CIDP, hemorrhoids, here for a follow up visit.   He's had ongoing rectal bleeding for years. He had a colonoscopy with me on 03/20/18 confirming that internal hemmorhoids are the cause for his symptoms. He also had numerous adenomas and is due for repeat colonoscopy in 3 years. He has grade III hemorrhoids, he has to periodically reduce the prolapse. He previously was requesting therapy for hemorrhoids however we held off at the last visit given he was feeling better and there was a concern about underlying cirrhosis of the liver. He states he is not having any constipation and moving his bowels pretty well. He says he has not had any rectal bleeding for the past few weeks since his colonoscopy and generally hemorrhoids are not bothering him too much right now. I  He had an US of the abdomen done on 04/03/18 showing fatty infiltration of the liver with subtle nodularity of the liver worrisome for early cirrhosis. Spleen was normal in size. He has some mild thrombocytopenia but INR normal. We discussed the findings and the likelihood for underlying cirrhosis. He previously was treated for hepatitis C remotely using interferon and ribavirin. Follow-up HCV RNA was negative. He does continue to drink 24 ounces of beer 2 on a daily basis. He states previously drank more heavily. He is planning on stopping alcohol in the near future. His son recently moved in with him to help take care of him. He's not had an upper endoscopy in several years per his report.   Colonoscopy 03/20/18 - 11 polyps removed, most adenomas, internal hemorrhoids Colonoscopy 04/2015 - 3 polyps, largest 1cm in sigmoid colon - excellent prep, done at [facility] - Dr. Newman Pies - path all c/w adenomas     Past Medical History:  Diagnosis Date  . Anxiety   . Arthritis   . Back pain   . CIDP (chronic inflammatory demyelinating polyneuropathy) (HCC)    does  therapy at house every 3 weeks per patient. immunotherapy. Next one is 03/29/2018  . Cirrhosis ([place])    due to combination of hep C and alcohol  . Colon polyp   . Depression   . Epilepsy, unspecified, not intractable, without status epilepticus ([place])   . Epilepsy, unspecified, not intractable, without status epilepticus ([place])   . GERD (gastroesophageal reflux disease)   . Hemorrhoids   . History of hepatitis C    did 12 month of therapy per patient   . Hypercholesteremia 03/2017   210  . Hyperlipidemia   . Hypertension   . Neck pain   . Other acariasis    Hep C  . PTSD (post-traumatic stress disorder)   . Rectal bleeding   . Seizures ([place])   . Tubular adenoma of colon 2017     Past Surgical History:  Procedure Laterality Date  . COLONOSCOPY  2017   [facility]. Dr Newman Pies. Said they could see 15 polpys but didn't remove them per patient   . ESOPHAGOGASTRODUODENOSCOPY     in [place]. before 2009 per patient  . ganglion cyst removal    . HERNIA REPAIR     inguinal per patient   . JOINT REPLACEMENT     Knee  . TOTAL KNEE ARTHROPLASTY Bilateral    Family History  Problem Relation Age of Onset  . Cancer Mother 42       myosarcoma, ovarian  . Lung cancer Father   .  Emphysema Father   . Colonic polyp Father   . Leukemia Sister 4  . Lung cancer Brother 62  . Brain cancer Brother   . Cancer Maternal Grandmother   . Colon cancer Neg Hx   . Colon polyps Neg Hx   . Esophageal cancer Neg Hx   . Rectal cancer Neg Hx   . Stomach cancer Neg Hx    Social History   Tobacco Use  . Smoking status: Current Every Day Smoker    Packs/day: 0.25    Years: 40.00    Pack years: 10.00  . Smokeless tobacco: Never Used  . Tobacco comment: smoking 5 cigs/day  Substance Use Topics  . Alcohol use: Yes    Alcohol/week: 0.0 standard drinks    Comment: pt states "2 24oz daily when I can"  . Drug use: Not Currently    Types: Marijuana   Current Outpatient Medications    Medication Sig Dispense Refill  . Cyanocobalamin (VITAMIN B-12 PO) Take by mouth.    . DULoxetine (CYMBALTA) 30 MG capsule Take 1 capsule (30 mg total) by mouth daily. 30 capsule 1  . folic acid (FOLVITE) 1 MG tablet Take 1 tablet (1 mg total) by mouth daily. 30 tablet 1  . Immune Globulin, Human, 0.5 GM/10ML SOLN Inject into the vein every 21 ( twenty-one) days.    Marland Kitchen levETIRAcetam (KEPPRA) 500 MG tablet Take 1 tablet (500 mg total) by mouth 2 (two) times daily. 60 tablet 5  . lisinopril (PRINIVIL,ZESTRIL) 5 MG tablet Take 5 mg by mouth daily.   5  . LORazepam (ATIVAN) 0.5 MG tablet TAKE 1 TABLET BY MOUTH DAILY  1  . Multiple Vitamin (MULTIVITAMIN WITH MINERALS) TABS tablet Take 1 tablet by mouth daily. 30 tablet 0  . omeprazole (PRILOSEC) 20 MG capsule Take 2 capsules (40 mg total) by mouth daily. 60 capsule 2  . pravastatin (PRAVACHOL) 40 MG tablet Take 1 tablet (40 mg total) by mouth daily. 90 tablet 3  . Thiamine HCl (VITAMIN B-1 PO) Take 1 tablet by mouth daily.    . Thiamine HCl (VITAMIN B-1) 250 MG tablet Take 250 mg by mouth daily.    . traMADol (ULTRAM) 50 MG tablet Take 50 mg by mouth 2 (two) times daily.   5  . traZODone (DESYREL) 100 MG tablet Take 1 tablet (100 mg total) by mouth at bedtime. (Patient taking differently: Take 100 mg by mouth at bedtime as needed for sleep. ) 30 tablet 3   No current facility-administered medications for this visit.    Allergies  Allergen Reactions  . Codeine Other (See Comments)    Unknown childhood allergy     Review of Systems: All systems reviewed and negative except where noted in HPI.    US Abdomen Complete  Result Date: 04/03/2018 CLINICAL DATA:  Elevated liver function tests. History of hepatitis C. EXAM: ABDOMEN ULTRASOUND COMPLETE COMPARISON:  CT abdomen and pelvis 04/19/2017. FINDINGS: Gallbladder: No gallstones or wall thickening visualized. No sonographic Murphy sign noted by sonographer. Common bile duct: Diameter: 0.3 cm  Liver: No focal lesion is identified. Echogenicity is diffusely increased consistent with fatty infiltration. Subtle nodularity of the liver is identified. Portal vein is patent on color Doppler imaging with normal direction of blood flow towards the liver. IVC: No abnormality visualized. Pancreas: Visualized portion unremarkable. Spleen: Size and appearance within normal limits. Right Kidney: Length: 9.7 cm. Echogenicity within normal limits. No mass or hydronephrosis visualized. Left Kidney: Length: 11 cm. Echogenicity within  normal limits. No mass or hydronephrosis visualized. Abdominal aorta: No aneurysm visualized. Other findings: None.  No ascites. IMPRESSION: Fatty infiltration of the liver. Subtle nodularity of the liver border is worrisome for early cirrhosis. The examination is otherwise negative. Electronically Signed   By: Inge Rise M.D.   On: 04/03/2018 09:56   Lab Results  Component Value Date   ALT 96 (H) 03/17/2018   AST 129 (H) 03/17/2018   ALKPHOS 61 03/17/2018   BILITOT 0.4 03/17/2018    Lab Results  Component Value Date   CREATININE 0.63 03/17/2018   BUN 6 03/17/2018   NA 135 03/17/2018   K 4.1 03/17/2018   CL 99 03/17/2018   CO2 26 03/17/2018    Lab Results  Component Value Date   ALT 96 (H) 03/17/2018   AST 129 (H) 03/17/2018   ALKPHOS 61 03/17/2018   BILITOT 0.4 03/17/2018    Lab Results  Component Value Date   INR 1.0 03/17/2018   INR 1.04 11/18/2016   Lab Results  Component Value Date   WBC 3.8 (L) 03/17/2018   HGB 14.2 03/17/2018   HCT 41.0 03/17/2018   MCV 97.6 03/17/2018   PLT 98.0 (L) 03/17/2018      Physical Exam: BP 118/78   Pulse 68   Ht 5\' 9"  (1.753 m)   Wt 156 lb 4 oz (70.9 kg)   BMI 23.07 kg/m  Constitutional: Pleasant male in no acute distress, in wheelchair HEENT: Normocephalic and atraumatic. Conjunctivae are normal. No scleral icterus. Neck supple.  Cardiovascular: Normal rate, regular rhythm.  Pulmonary/chest:  Effort normal and breath sounds normal. No wheezing, rales or rhonchi. Abdominal: Soft, nondistended, nontender. There are no masses palpable. No hepatomegaly. Extremities: no edema Lymphadenopathy: No cervical adenopathy noted. Neurological: Alert and oriented to person place and time. Skin: Skin is warm and dry. No rashes noted. Psychiatric: Normal mood and affect. Behavior is normal.   ASSESSMENT AND PLAN: 53 year old male here for reassessment of the following issues:  Cirrhosis / alcohol use / history of hep C - newly diagnosed cirrhosis based on ultrasound lab findings. I suspect this is due to a combination of ongoing alcohol use and his history of hepatitis C. His hepatitis C has been eradicated. He continues to drink alcohol on a daily basis as outlined. I discussed what cirrhosis is with him, long-term risks for decompensation and HCC. I highly recommend that he completely abstain from alcohol. He reports he is working on this but declined assistance with medications or counseling which was offered him today. As he goes this process if he has any symptoms of withdrawal last him to contact me we can give her medication to prevent that. Otherwise I discussed need for ongoing surveillance ultrasound in 6 months. We will repeat labs at that time as well. He is due for an upper endoscopy to screen for esophageal varices. I discussed risks and benefits of EGD and anesthesia with him and he wanted to proceed. Further recommendations pending results.  Hemorrhoids - grade III hemorrhoids, bothering him previously but symptoms have since abated. I do think he is a candidate for banding if needed in the future, as he has no rectal varices and has compensated cirrhosis, however given his improvement in symptoms over time will hold off on banding for now, he can follow up as needed for this issue.   [place] Cellar, MD [facility]

## 2018-04-12 NOTE — Patient Instructions (Addendum)
Normal BMI (Body Mass Index- based on height and weight) is between 19 and 25. Your BMI today is Body mass index is 23.07 kg/m. Marland Kitchen Please consider follow up  regarding your BMI with your Primary Care Provider.  You have been scheduled for an endoscopy. Please follow written instructions given to you at your visit today. If you use inhalers (even only as needed), please bring them with you on the day of your procedure. Your physician has requested that you go to www.startemmi.com and enter the access code given to you at your visit today. This web site gives a general overview about your procedure. However, you should still follow specific instructions given to you by our office regarding your preparation for the procedure.   Thank you for entrusting me with your care and for choosing [facility], Dr. [place] Cellar

## 2018-04-18 ENCOUNTER — Encounter: Payer: Self-pay | Admitting: *Deleted

## 2018-04-18 DIAGNOSIS — R569 Unspecified convulsions: Secondary | ICD-10-CM | POA: Insufficient documentation

## 2018-04-19 ENCOUNTER — Encounter: Payer: Medicaid Other | Admitting: Gastroenterology

## 2018-04-20 ENCOUNTER — Encounter: Payer: Self-pay | Admitting: Gastroenterology

## 2018-04-20 ENCOUNTER — Ambulatory Visit (AMBULATORY_SURGERY_CENTER): Payer: Medicaid Other | Admitting: Gastroenterology

## 2018-04-20 VITALS — BP 106/64 | HR 96 | Temp 99.8°F | Resp 17 | Wt 154.0 lb

## 2018-04-20 DIAGNOSIS — K7460 Unspecified cirrhosis of liver: Secondary | ICD-10-CM | POA: Diagnosis not present

## 2018-04-20 DIAGNOSIS — K449 Diaphragmatic hernia without obstruction or gangrene: Secondary | ICD-10-CM | POA: Diagnosis not present

## 2018-04-20 MED ORDER — SODIUM CHLORIDE 0.9 % IV SOLN: 500.0000 mL | Freq: Once | INTRAVENOUS | Status: DC

## 2018-04-20 NOTE — Progress Notes (Signed)
Report given to PACU, vss 

## 2018-04-20 NOTE — Op Note (Signed)
[place] Patient Name: Cody Delacruz Procedure Date: 04/20/2018 8:53 AM MRN: [id number] Endoscopist: Cody Delacruz , MD Age: 53 Referring MD:  Date of Birth: [birth year] Gender: Male Account #: [id number] Procedure:                Upper GI endoscopy Indications:              Cirrhosis rule out esophageal varices (history of                            hep C eradicated, continued alcohol use) Medicines:                Monitored Anesthesia Care Procedure:                Pre-Anesthesia Assessment:                           - Prior to the procedure, a History and Physical                            was performed, and patient medications and                            allergies were reviewed. The patient's tolerance of                            previous anesthesia was also reviewed. The risks                            and benefits of the procedure and the sedation                            options and risks were discussed with the patient.                            All questions were answered, and informed consent                            was obtained. Prior Anticoagulants: The patient has                            taken no previous anticoagulant or antiplatelet                            agents. ASA Grade Assessment: III - A patient with                            severe systemic disease. After reviewing the risks                            and benefits, the patient was deemed in                            satisfactory condition to undergo the procedure.  After obtaining informed consent, the endoscope was                            passed under direct vision. Throughout the                            procedure, the patient's blood pressure, pulse, and                            oxygen saturations were monitored continuously. The                            Endoscope was introduced through the mouth, and                            advanced  to the second part of duodenum. The upper                            GI endoscopy was accomplished without difficulty.                            The patient tolerated the procedure well. Scope In: Scope Out: Findings:                 Esophagogastric landmarks were identified: the                            Z-line was found at 39 cm, the gastroesophageal                            junction was found at 39 cm and the upper extent of                            the gastric folds was found at 41 cm from the                            incisors.                           A 2 cm hiatal hernia was present.                           The exam of the esophagus was otherwise normal. No                            esophageal varices.                           The entire examined stomach was normal. No gastric                            varices.                           The duodenal bulb and second portion of  the                            duodenum were normal. Complications:            No immediate complications. Estimated blood loss:                            None. Estimated Blood Loss:     Estimated blood loss: none. Impression:               - Esophagogastric landmarks identified.                           - 2 cm hiatal hernia.                           - Normal esophagus - no esophageal varices                           - Normal stomach - no gastric varices                           - Normal duodenal bulb and second portion of the                            duodenum. Recommendation:           - Patient has a contact number available for                            emergencies. The signs and symptoms of potential                            delayed complications were discussed with the                            patient. Return to normal activities tomorrow.                            Written discharge instructions were provided to the                            patient.                            - Resume previous diet.                           - Continue present medications.                           - Repeat upper endoscopy in 2 years for screening                            purposes.                           - Alcohol  abstinence - will discuss with patient                            referral to behavioral health for assitance with                            this Cody Delacruz. Cody Degraffenreid, MD 04/20/2018 9:10:43 AM This report has been signed electronically.

## 2018-04-20 NOTE — Patient Instructions (Signed)
Handout given on GERD protocol/hiatal hernia Alcohol abstinence Repeat EGD in 2 years   YOU HAD AN ENDOSCOPIC PROCEDURE TODAY AT [facility]:   Refer to the procedure report that was given to you for any specific questions about what was found during the examination.  If the procedure report does not answer your questions, please call your gastroenterologist to clarify.  If you requested that your care partner not be given the details of your procedure findings, then the procedure report has been included in a sealed envelope for you to review at your convenience later.  YOU SHOULD EXPECT: Some feelings of bloating in the abdomen. Passage of more gas than usual.  Walking can help get rid of the air that was put into your GI tract during the procedure and reduce the bloating. If you had a lower endoscopy (such as a colonoscopy or flexible sigmoidoscopy) you may notice spotting of blood in your stool or on the toilet paper. If you underwent a bowel prep for your procedure, you may not have a normal bowel movement for a few days.  Please Note:  You might notice some irritation and congestion in your nose or some drainage.  This is from the oxygen used during your procedure.  There is no need for concern and it should clear up in a day or so.  SYMPTOMS TO REPORT IMMEDIATELY:    Following upper endoscopy (EGD)  Vomiting of blood or coffee ground material  New chest pain or pain under the shoulder blades  Painful or persistently difficult swallowing  New shortness of breath  Fever of 100F or higher  Black, tarry-looking stools  For urgent or emergent issues, a gastroenterologist can be reached at any hour by calling [phone].   DIET:  We do recommend a small meal at first, but then you may proceed to your regular diet.  Drink plenty of fluids but you should avoid alcoholic beverages for 24 hours.  ACTIVITY:  You should plan to take it easy for the rest of today and you  should NOT DRIVE or use heavy machinery until tomorrow (because of the sedation medicines used during the test).    FOLLOW UP: Our staff will call the number listed on your records the next business day following your procedure to check on you and address any questions or concerns that you may have regarding the information given to you following your procedure. If we do not reach you, we will leave a message.  However, if you are feeling well and you are not experiencing any problems, there is no need to return our call.  We will assume that you have returned to your regular daily activities without incident.  If any biopsies were taken you will be contacted by phone or by letter within the next 1-3 weeks.  Please call us at [phone] if you have not heard about the biopsies in 3 weeks.    SIGNATURES/CONFIDENTIALITY: You and/or your care partner have signed paperwork which will be entered into your electronic medical record.  These signatures attest to the fact that that the information above on your After Visit Summary has been reviewed and is understood.  Full responsibility of the confidentiality of this discharge information lies with you and/or your care-partner.

## 2018-04-21 ENCOUNTER — Telehealth: Payer: Self-pay

## 2018-04-21 ENCOUNTER — Ambulatory Visit
Admission: RE | Admit: 2018-04-21 | Discharge: 2018-04-21 | Disposition: A | Discharge status: Home / Self Care | Payer: Medicaid Other | Source: Ambulatory Visit | Attending: Neurology | Admitting: Neurology

## 2018-04-21 ENCOUNTER — Other Ambulatory Visit: Payer: Self-pay

## 2018-04-21 DIAGNOSIS — G8314 Monoplegia of lower limb affecting left nondominant side: Secondary | ICD-10-CM

## 2018-04-21 DIAGNOSIS — G6181 Chronic inflammatory demyelinating polyneuritis: Secondary | ICD-10-CM

## 2018-04-21 DIAGNOSIS — F1010 Alcohol abuse, uncomplicated: Secondary | ICD-10-CM

## 2018-04-21 DIAGNOSIS — R29898 Other symptoms and signs involving the musculoskeletal system: Secondary | ICD-10-CM

## 2018-04-21 NOTE — Telephone Encounter (Signed)
-----   Message from Yetta Flock, MD sent at 04/21/2018  1:23 PM EST ----- Regarding: RE: Referral Yes if there is no referral there, could you help place one? It is for alcohol abuse. Thanks much ----- Message ----- From: Algernon Huxley, RN Sent: 04/21/2018  10:56 AM EST To: Yetta Flock, MD Subject: Referral                                       Does a referral need to be made to behavioral health on this gentleman? Saw mention of it on colon report from yesterday.  Thanks, [employer]

## 2018-04-21 NOTE — Telephone Encounter (Signed)
  Follow up Call-  Call back number 04/20/2018 03/20/2018  Post procedure Call Back phone  # [phone], room 78  Permission to leave phone message Yes Yes  Some recent data might be hidden     Patient questions:  Do you have a fever, pain , or abdominal swelling? No. Pain Score  0 *  Have you tolerated food without any problems? Yes.    Have you been able to return to your normal activities? No. Per son had some "seizures" last night.  They have contacted his pcp and plan on seeing them today. Do you have any questions about your discharge instructions: Diet   No. Medications  No. Follow up visit  No.  Do you have questions or concerns about your Care? No.  Actions: * If pain score is 4 or above:*please note had seizures last pm and following-up with pcp

## 2018-04-21 NOTE — Telephone Encounter (Signed)
Referral entered in epic. 

## 2018-04-25 ENCOUNTER — Telehealth: Payer: Self-pay | Admitting: *Deleted

## 2018-04-25 NOTE — Telephone Encounter (Signed)
-----   Message from Alda Berthold, DO sent at 04/21/2018  5:35 PM EST ----- Please inform patient that CT head lumbar spine looks good - age-related changes, but nothing to explain his left leg weakness.  I would recommend repeating his NCS/EMG of the left > right leg to look for interval change. If he is agreeable would need to be performed after the 2nd week of February. Thanks.

## 2018-04-25 NOTE — Telephone Encounter (Signed)
Brandy left message for patient to call back and schedule the NCS.

## 2018-04-26 ENCOUNTER — Other Ambulatory Visit: Payer: Self-pay

## 2018-04-26 DIAGNOSIS — F1010 Alcohol abuse, uncomplicated: Secondary | ICD-10-CM

## 2018-04-27 ENCOUNTER — Other Ambulatory Visit: Payer: Self-pay | Admitting: *Deleted

## 2018-04-27 DIAGNOSIS — G6181 Chronic inflammatory demyelinating polyneuritis: Secondary | ICD-10-CM

## 2018-04-28 ENCOUNTER — Ambulatory Visit: Payer: Medicaid Other | Admitting: Gastroenterology

## 2018-05-09 ENCOUNTER — Ambulatory Visit (INDEPENDENT_AMBULATORY_CARE_PROVIDER_SITE_OTHER): Payer: Medicaid Other | Admitting: Neurology

## 2018-05-09 DIAGNOSIS — G6181 Chronic inflammatory demyelinating polyneuritis: Secondary | ICD-10-CM

## 2018-05-09 DIAGNOSIS — G40909 Epilepsy, unspecified, not intractable, without status epilepticus: Secondary | ICD-10-CM

## 2018-05-09 DIAGNOSIS — F1010 Alcohol abuse, uncomplicated: Secondary | ICD-10-CM | POA: Diagnosis not present

## 2018-05-09 NOTE — Procedures (Signed)
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  NCV & EMG Findings: Extensive electrodiagnostic testing of the left lower extremity and additional studies of the right shows:  1. Bilateral sural and superficial peroneal sensory responses are within normal limits and show improved amplitude as compared to study dated 10/25/2017. 2. Bilateral peroneal motor responses show reduced amplitude (L1.4, R1.2 mV) and decreased conduction velocity (B Fib-Ankle, L35, R36 m/s), which was previously absent.  Peroneal motor response on the left is normal and reduced on the right (2.7 mV), both of which were previously severely reduced (R0.9, L1.60mV) and prolonged.  Bilateral tibial motor responses are within normal limits.   3. Bilateral tibial H reflex studies show prolonged latencies.   4. Chronic motor axonal loss changes are seen affecting L3-L5 myotomes bilaterally, which is worse above the knees.  There is no evidence of accompanied active denervation.   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  Compared to prior study on October 25, 2017, there has been a significant improvement below the knees, and interval worsening proximally.   ___________________________ Narda Amber, DO    Nerve Conduction Studies Anti Sensory Summary Table   Site NR Peak (ms) Norm Peak (ms) P-T Amp (V) Norm P-T Amp  Left Sup Peroneal Anti Sensory (Ant Lat Mall)  32C  12 cm    2.7 <4.6 13.3 >4  Right Sup Peroneal Anti Sensory (Ant Lat Mall)  32C  12 cm    2.6  <4.6 10.2 >4  Left Sural Anti Sensory (Lat Mall)  32C  Calf    3.7 <4.6 12.9 >4  Right Sural Anti Sensory (Lat Mall)  32C  Calf    3.9 <4.6 11.5 >4   Motor Summary Table   Site NR Onset (ms) Norm Onset (ms) O-P Amp (mV) Norm O-P Amp Site1 Site2 Delta-0 (ms) Dist (cm) Vel (m/s) Norm Vel (m/s)  Left Peroneal Motor (Ext Dig Brev)  32C  Ankle    4.6 <6.0 1.4 >2.5 B Fib Ankle 10.3 36.0 35 >40  B Fib    14.9  1.2  Poplt B Fib 1.7 8.0 47 >40  Poplt    16.6  1.2         Right Peroneal Motor (Ext Dig Brev)  32C  Ankle    4.6 <6.0 1.2 >2.5 B Fib Ankle 10.9 39.0 36 >40  B Fib    15.5  0.8  Poplt B Fib 2.0 8.0 40 >40  Poplt    17.5  0.8         Left Peroneal TA Motor (Tib Ant)  32C  Fib Head    4.5 <4.5 3.1 >3 Poplit Fib Head 1.4 8.0 57 >40  Poplit    5.9  3.0         Right Peroneal TA Motor (Tib Ant)  32C  Fib Head    3.9 <4.5 2.7 >3 Poplit Fib Head 1.5 8.0 53 >40  Poplit    5.4  2.3         Left Tibial Motor (Abd Hall Brev)  32C  Ankle    4.1 <6.0 10.0 >4 Knee Ankle 10.7 43.0 40 >40  Knee    14.8  6.4         Right Tibial Motor (Abd Hall Brev)  32C  Ankle    4.4 <6.0 12.5 >4 Knee Ankle 10.7 45.0 42 >40  Knee    15.1  9.1          H Reflex Studies   NR H-Lat (ms) Lat Norm (ms) L-R H-Lat (ms)  Left Tibial (Gastroc)  32C     39.32 <35 2.72  Right Tibial (Gastroc)  32C     36.60 <35 2.72   EMG   Side Muscle Ins Act Fibs Psw Fasc Number Recrt Dur Dur. Amp Amp. Poly Poly. Comment  Right RectFemoris Nml Nml Nml Nml 3- Rapid All 1+ All 1+ All 1+ N/A  Right AdductorLong Nml Nml Nml Nml 3- Rapid Many 1+ Many 1+ Nml Nml N/A  Right BicepsFemS Nml Nml Nml Nml Nml Nml Nml Nml Nml Nml Nml Nml N/A  Right Flex Dig Long Nml Nml Nml Nml Nml Nml Nml Nml Nml Nml Nml Nml N/A  Right Gastroc Nml Nml Nml Nml Nml Nml Nml Nml Nml Nml Nml Nml N/A  Right GluteusMed Nml Nml Nml Nml Nml Nml Nml Nml Nml Nml Nml Nml N/A  Right AntTibialis Nml Nml Nml Nml 2- Rapid Many 1+ Some 1+ Some 1+ N/A  Left  RectFemoris Nml Nml Nml Nml SMU Rapid All 1+ All 1+ All 1+ N/A  Left AntTibialis Nml Nml Nml Nml 2- Rapid All 1+ All 1+ All 1+ N/A  Left Gastroc Nml Nml Nml Nml 1- Rapid Few 1+ Few 1+ Nml Nml N/A  Left AdductorLong Nml Nml Nml Nml 3- Rapid Many 1+ Many 1+ Nml Nml N/A  Left GluteusMed Nml Nml Nml Nml Nml Nml Nml Nml Nml Nml Nml Nml N/A  Left Iliacus Nml Nml Nml Nml Nml Nml Nml Nml Nml Nml Nml Nml N/A  Left Lumbo Parasp Low Nml Nml Nml Nml Nml Nml Nml Nml Nml Nml Nml Nml N/A  Left Flex Dig Long Nml Nml Nml Nml Nml Nml Nml Nml Nml Nml Nml Nml N/A      Waveforms:

## 2018-05-09 NOTE — Progress Notes (Signed)
Follow-up Visit   Date: 05/09/18    Cody Delacruz MRN: [id number] DOB: [birth year]   Interim History: Cody Delacruz is a 53 y.o. right-handed Caucasian male with alcohol abuse, tobacco use, depression, seizure disorder, GERD, alcoholic cirrhosis, and hyperlipidemia returning to the clinic for follow-up of polyradiculoneuropathy.  The patient was accompanied to the clinic by self.  History of present illness: Starting around early May 2019, he began having numbness and tingling of the feet and lower legs up to the level of the knees.  Over the next four weeks, he steadily developed greater difficulty with balance, weakness of the legs, and started having increased falls. He is falling 1-2 times per week and has sustained superficial injuries.  He usually crawls to something to pull himself up.  He has been using a cane since 2011.  He was drinking (2) 24 oz beers daily x 10 years.  He usually uses a wheelchair because of chronic low back pain for the past two years.    He has history of generalized seizures which started in 2001.  He takes depakote 559m three times.  He is noncompliant with medications.  He last seizure was 2 months ago.  He typically gets seizure once per month which lasts 5 minutes.  The last time he saw a neurologist [id number]. He reports being toxic on a previous AED, but does not recall the name.  He does not drive.    UPDATE 03/06/2018:  In August 2019, he has NCS/EMG and CSF testing which was favoring polyradiculoneuropathy and therefore he was treated with IVIG.  He has noticed that he can dorsiflex the left > right foot better.  No change in numbness of the feet and he now has some numbness of the hands.  He continues to drink 2 beers daily. He has one ER visit in September with a fall and fractured his ribs.  He has been more compliant with taking depakote 5058mTID and noticed reduced frequency of seizures.  He had had 2 since  his last visit, where as previously occurring every month.   UPDATE 04/06/2018:  He scheduled sooner visit because of increased falls and inability to raise his left leg.  He developed weakness of the left leg about a week ago, there was no pain associated with it.  He also has some incontinence.  He has been getting IVIG for the past 4 months and he has not noticed any benefit.  He has reduced his alcohol intake since his last visit.  He was told he has early signs of cirrhosis.  He has been doing well on keppra with no interval seizures.  VPA was stopped due to low platelet count.  UPDATE 05/09/2018:  Due to left leg weakness, patient had MRI lumbar spine which did not show any nerve impingement to explain his symptoms.  He also had CT head without contrast, no signs of stroke.  He returned to the clinic today to undergo electrodiagnostic testing to determine if there has been any benefit with IVIG and to better clarify the nature of his new proximal left leg weakness.  He continues to drink alcohol regularly, despite his medical providers strongly advising against this.   Medications:  Current Outpatient Medications on File Prior to Visit  Medication Sig Dispense Refill  . Cyanocobalamin (VITAMIN B-12 PO) Take by mouth.    . DULoxetine (CYMBALTA) 30 MG capsule Take 1 capsule (30 mg total) by  mouth daily. 30 capsule 1  . Immune Globulin, Human, 0.5 GM/10ML SOLN Inject into the vein every 21 ( twenty-one) days.    Marland Kitchen levETIRAcetam (KEPPRA) 500 MG tablet Take 1 tablet (500 mg total) by mouth 2 (two) times daily. 60 tablet 5  . lisinopril (PRINIVIL,ZESTRIL) 5 MG tablet Take 5 mg by mouth daily.   5  . LORazepam (ATIVAN) 0.5 MG tablet TAKE 1 TABLET BY MOUTH DAILY  1  . Multiple Vitamin (MULTIVITAMIN WITH MINERALS) TABS tablet Take 1 tablet by mouth daily. 30 tablet 0  . omeprazole (PRILOSEC) 20 MG capsule Take 2 capsules (40 mg total) by mouth daily. 60 capsule 2  . pravastatin (PRAVACHOL) 40 MG tablet  Take 1 tablet (40 mg total) by mouth daily. 90 tablet 3  . Thiamine HCl (VITAMIN B-1 PO) Take 1 tablet by mouth daily.    . Thiamine HCl (VITAMIN B-1) 250 MG tablet Take 250 mg by mouth daily.    . traMADol (ULTRAM) 50 MG tablet Take 50 mg by mouth 2 (two) times daily.   5  . traZODone (DESYREL) 100 MG tablet Take 1 tablet (100 mg total) by mouth at bedtime. (Patient taking differently: Take 100 mg by mouth at bedtime as needed for sleep. ) 30 tablet 3   No current facility-administered medications on file prior to visit.     Allergies:  Allergies  Allergen Reactions  . Codeine Other (See Comments)    Unknown childhood allergy    Review of Systems:  CONSTITUTIONAL: No fevers, chills, night sweats, or weight loss.  EYES: No visual changes or eye pain ENT: No hearing changes.  No history of nose bleeds.   RESPIRATORY: No cough, wheezing and shortness of breath.   CARDIOVASCULAR: Negative for chest pain, and palpitations.   GI: Negative for abdominal discomfort, blood in stools or black stools.  No recent change in bowel habits.   GU:  No history of incontinence.   MUSCLOSKELETAL: +history of joint pain or swelling.  No myalgias.   SKIN: Negative for lesions, rash, and itching.   ENDOCRINE: Negative for cold or heat intolerance, polydipsia or goiter.   PSYCH:  No depression or anxiety symptoms.   NEURO: As Above.   Vital Signs:  There were no vitals taken for this visit.  Neurological Exam: MENTAL STATUS including orientation to time, place, person, recent and remote memory, attention span and concentration, language, and fund of knowledge is normal.  Speech is not dysarthric.  CRANIAL NERVES:    Face is symmetric.  MOTOR:  TA muscle bulk is improved, there is moderate bilateral quadriceps atrophy.   No pronator drift.  Tone is normal.    Right Upper Extremity:    Left Upper Extremity:    Deltoid  5/5   Deltoid  5/5   Biceps  5/5   Biceps  5/5   Triceps  5/5   Triceps  5/5     Wrist extensors  5/5   Wrist extensors  5/5   Wrist flexors  5/5   Wrist flexors  5/5   Finger extensors  5/5   Finger extensors  5/5   Finger flexors  5/5   Finger flexors  5/5   Dorsal interossei  5/5   Dorsal interossei  5/5   Abductor pollicis  5/5   Abductor pollicis  5/5   Tone (Ashworth scale)  0  Tone (Ashworth scale)  0   Right Lower Extremity:    Left Lower Extremity:  Hip flexors  4/5   Hip flexors  2/5   Hip extensors  5/5   Hip extensors  4/5   Knee flexors  5/5   Knee flexors  4/5   Knee extensors  5/5   Knee extensors  3/5   Dorsiflexors  5-/5   Dorsiflexors  5-/5   Plantarflexors  5-/5   Plantarflexors  5/5   Toe extensors  4/5   Toe extensors  4/5   Toe flexors  4/5   Toe flexors  4/5   Tone (Ashworth scale)  0  Tone (Ashworth scale)  0   COORDINATION/GAIT:  Severe sensory ataxia, gait is wide-based and weakness of the left leg, no foot drop (improved).   Data: MRI lumbar spine wo contrast 08/18/2017: 1. Degenerative change of the lumbar spine without fracture or malalignment. 2. No canal stenosis. Mild to moderate RIGHT L5-S1 neural foraminal narrowing.  Labs 08/18/2017:  Vitamin B12 498, TSH 1.159, RPR neg, SPEP with IFE no M protein Labs 10/12/2017:  VPA <12, MMA 60, copper 70, vitamin B1 9, folate 16.5, ESR 8, CRP 0.1  NCS/EMG of the legs 10/25/2017:  The electrophysiologic findings are most consistent with a subacute and severe common peroneal neuropathy, demyelinating and axon loss in type, affecting bilateral lower extremities. Alternatively, a polyradiculoneuropathy can be considered given abnormal late responses.  Correlate clinically. There is no evidence of a large fiber sensorimotor polyneuropathy.  NCS/EMG of the legs 05/09/2018:  The electrophysiologic findings continue to show a polyradiculoneuropathy affecting bilateral lower extremities.  Compared to prior study on October 25, 2017, there has been a significant improvement below the knees, and  interval worsening proximally.  CSF 11/14/2017:  R0 W0 P70 G67  No OCB, IgG index 0.44  Lyme neg, cytology neg  Lab Results  Component Value Date   CHOL 196 05/19/2016   HDL 60 05/19/2016   LDLCALC 105 (H) 05/19/2016   TRIG 154 (H) 05/19/2016   CHOLHDL 3.3 05/19/2016   MRI lumbar spine 04/21/2018: The dominant finding is central and rightward protrusion at L5-S1. Slight mass effect on the RIGHT S1 nerve root. No compressive foraminal narrowing.   IMPRESSION/PLAN: 1.  Polyradiculoneuropathy, motor variant of CIDP.  Patient had electrodiagnostic testing today which shows a significant improvement in motor responses below the knee, however he continues to have weakness proximally, affecting quadriceps and adductors. There is no structural impingement in the lumbar spine to explain his weakness.  No evidence of stroke on CT head.  His distal strength is now full and intact whereas previously he had bilateral foot drop.  Given his response to IVIG, I will continue him on 1 mg/kg every 21 days.  Referral will be sent for home physical therapy.  I had advised him to start walking without using AFOs as to strengthen legs, and to always use walker due to instability.  2.  Alcohol abuse contributing to neuropathy, sensory ataxia, and vitamin deficiency.  I have reiterated the importance of alcohol abstinence to allow neural recovery.  Continue thiamine 126m daily.  3.  Seizure disorder, continue keppra 5018mBID. Medication compliance stressed.   Return to clinic in 3 months  Greater than 50% of this 30 minute visit was spent in counseling, explanation of diagnosis, planning of further management, and coordination of care.   Thank you for allowing me to participate in patient's care.  If I can answer any additional questions, I would be pleased to do so.    Sincerely,   K. Posey Pronto, DO

## 2018-05-10 ENCOUNTER — Encounter: Payer: Medicaid Other | Admitting: Gastroenterology

## 2018-05-16 ENCOUNTER — Encounter: Payer: Medicaid Other | Admitting: Gastroenterology

## 2018-07-17 ENCOUNTER — Telehealth (INDEPENDENT_AMBULATORY_CARE_PROVIDER_SITE_OTHER): Payer: Medicaid Other | Admitting: Neurology

## 2018-07-17 ENCOUNTER — Telehealth: Payer: Self-pay | Admitting: Neurology

## 2018-07-17 ENCOUNTER — Other Ambulatory Visit: Payer: Self-pay

## 2018-07-17 ENCOUNTER — Encounter: Payer: Self-pay | Admitting: Neurology

## 2018-07-17 ENCOUNTER — Encounter: Payer: Self-pay | Admitting: *Deleted

## 2018-07-17 VITALS — Ht 69.0 in | Wt 153.0 lb

## 2018-07-17 DIAGNOSIS — G6181 Chronic inflammatory demyelinating polyneuritis: Secondary | ICD-10-CM | POA: Diagnosis not present

## 2018-07-17 DIAGNOSIS — G40909 Epilepsy, unspecified, not intractable, without status epilepticus: Secondary | ICD-10-CM

## 2018-07-17 DIAGNOSIS — F1010 Alcohol abuse, uncomplicated: Secondary | ICD-10-CM

## 2018-07-17 MED ORDER — LEVETIRACETAM 500 MG PO TABS: 500.0000 mg | ORAL_TABLET | Freq: Two times a day (BID) | ORAL | 3 refills | Status: DC

## 2018-07-17 NOTE — Progress Notes (Signed)
   Virtual Visit via Telephone Note The purpose of this virtual visit is to provide medical care while limiting exposure to the novel coronavirus.    Consent was obtained for video visit:  Yes.   Answered questions that patient had about telehealth interaction:  Yes.   I discussed the limitations, risks, security and privacy concerns of performing an evaluation and management service by telemedicine. I also discussed with the patient that there may be a patient responsible charge related to this service. The patient expressed understanding and agreed to proceed.  Pt location: Home Physician Location: office Name of referring provider:  Alvester Chou, NP I connected with Cody Delacruz at patients initiation/request on 07/17/2018 at  9:30 AM EDT by video enabled telemedicine application and verified that I am speaking with the correct person using two identifiers. Pt MRN:  [id number] Pt DOB:  1966-03-18 Telephone Participants:  Cody Delacruz   History of Present Illness: This is a 53 y.o. male with with alcohol abuse, tobacco use, depression, seizure disorder, GERD, alcoholic cirrhosis, and hyperlipidemia returning for follow-up of polyradiculoneuropathy and seizures.  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 Imaging of the brain and lumbar spine did not show structural lesion.  He has noticed that he has improved left drop foot, but continues to feel weak in the upper leg.  He is compliant with using AFOs. He is falling about twice per week when using a cane.  He is not using a walker.  Several weeks ago, he had one breakthrough seizure.  He has good adherence and tolerability to Keppra 500mg  BID.  He stopped alcohol consumption about 2-3 weeks ago at the encouragement of his doctors and girlfriend.     He has reduced smoking 3 cigarettes  daily, down from half-pack daily. He is hoping to quit.     Assessment and Plan:  1.  Chronic inflammatory polyradiculoneuropathy - motor variant.  Improved distal strength but with persistent proximal leg weakness.   - Continue IVIG 1g/kg every 21 days  - Continue physical therapy exercises  - Start using a rollator  2.  Seizure disorder, well-controlled with only one breakthrough seizures  - Continue Keppra 500mg  BID  - Encouraged alcohol cessation  3.  Alcohol abuse contributing to neuropathy, sensory atacxa, and vitamin deficiency.   - Praised him for alcohol cessation and encouraged him to abstain  - Continue thiamine 100mg  daily  Follow Up Instructions:   I discussed the assessment and treatment plan with the patient. The patient was provided an opportunity to ask questions and all were answered. The patient agreed with the plan and demonstrated an understanding of the instructions.   The patient was advised to call back or seek an in-person evaluation if the symptoms worsen or if the condition fails to improve as anticipated.  Follow-up in 4 months  Total time spent:  30 min  Alda Berthold, DO

## 2018-07-17 NOTE — Telephone Encounter (Signed)
Patient returning call to Ashley.

## 2018-07-18 NOTE — Telephone Encounter (Signed)
I spoke with patient and informed him that we will set up his next appointment and call him.

## 2018-07-20 ENCOUNTER — Other Ambulatory Visit: Payer: Self-pay

## 2018-07-20 ENCOUNTER — Telehealth: Payer: Self-pay | Admitting: *Deleted

## 2018-07-20 ENCOUNTER — Ambulatory Visit (INDEPENDENT_AMBULATORY_CARE_PROVIDER_SITE_OTHER): Payer: Medicaid Other

## 2018-07-20 ENCOUNTER — Ambulatory Visit (HOSPITAL_COMMUNITY)
Admission: EM | Admit: 2018-07-20 | Discharge: 2018-07-20 | Disposition: A | Discharge status: Home / Self Care | Payer: Medicaid Other | Attending: Physician Assistant | Admitting: Physician Assistant

## 2018-07-20 ENCOUNTER — Encounter (HOSPITAL_COMMUNITY): Payer: Self-pay

## 2018-07-20 DIAGNOSIS — W19XXXA Unspecified fall, initial encounter: Secondary | ICD-10-CM

## 2018-07-20 DIAGNOSIS — S300XXA Contusion of lower back and pelvis, initial encounter: Secondary | ICD-10-CM | POA: Diagnosis not present

## 2018-07-20 NOTE — Discharge Instructions (Signed)
X-ray negative for fracture or dislocation.  Use ice compress for at least 15 to 20 minutes each time for the next 3 days.  He can then switch to warm compress to help dissolve the bruising.  Ibuprofen/Tylenol for pain.  Follow-up with PCP for further evaluation if symptoms not improving.  If experiencing numbness, tingling of the inner thighs, loss of bladder or bowel control, go to the emergency department for further evaluation.

## 2018-07-20 NOTE — ED Provider Notes (Signed)
[facility]    CSN: [id number] Arrival date & time: 07/20/18  1253     History   Chief Complaint Chief Complaint  Patient presents with  . Fall  . Back Pain    HPI Cody Delacruz is a 53 y.o. male.   53 year old male with history of chronic inflammatory demyelinating polyneuropathy, cirrhosis, epilepsy, hep C, HLD, HTN, comes in for evaluation after fall.  Patient states he either fell last night or this morning, states was "involved in something with my girlfriend" and cannot recall when he fell.  He admits to alcohol use.  Denies drug use.  States at baseline he is on the electric wheelchair.  He got off the wheelchair to use the restroom, when he slipped and fell onto his buttock.  He has swelling and contusion to the region with painful movement.  He has still been able to walk since the fall.  Denies radiation of pain.  Denies saddle anesthesia, loss of bladder or bowel control.  He has not taken anything for symptoms.     Past Medical History:  Diagnosis Date  . Anxiety   . Arthritis   . Back pain   . CIDP (chronic inflammatory demyelinating polyneuropathy) (HCC)    does therapy at house every 3 weeks per patient. immunotherapy. Next one is 03/29/2018  . Cirrhosis ([place])    due to combination of hep C and alcohol  . Colon polyp   . Depression   . Epilepsy, unspecified, not intractable, without status epilepticus ([place])   . Epilepsy, unspecified, not intractable, without status epilepticus ([place])   . GERD (gastroesophageal reflux disease)   . Hemorrhoids   . History of hepatitis C    did 12 month of therapy per patient   . Hypercholesteremia 03/2017   210  . Hyperlipidemia   . Hypertension   . Neck pain   . Other acariasis    Hep C  . PTSD (post-traumatic stress disorder)   . Rectal bleeding   . Seizures ([place])    last seizure approx 03/22/2018  . Tubular adenoma of colon 2017    Patient Active Problem List   Diagnosis Date Noted  . Seizures ([place])    . GI bleed 04/20/2017  . Rectal bleeding 04/19/2017  . Colitis 04/19/2017  . Rectal bleed 11/18/2016  . Dental abscess 09/01/2016  . Moderate episode of recurrent major depressive disorder ([place]) 09/01/2016  . Smoking addiction 09/01/2016  . Depression 08/08/2016  . Tobacco abuse 08/08/2016  . Alcohol abuse 08/08/2016  . Seizure ([place]) 08/08/2016  . Alcoholic intoxication with complication ([place])   . Numbness and tingling in both hands 01/22/2016  . Chronic pain syndrome 01/22/2016  . Assistance needed for mobility 01/22/2016  . Healthcare maintenance 03/10/2015  . Dyslipidemia 08/26/2014  . Bilateral low back pain with left-sided sciatica 02/21/2014  . Hepatitis C antibody test positive 05/28/2013  . GERD (gastroesophageal reflux disease) 05/01/2013  . Seizure disorder ([place]) 05/01/2013  . Neck pain, chronic 10/31/2012  . Chronic bilateral low back pain with left-sided sciatica 10/31/2012    Past Surgical History:  Procedure Laterality Date  . COLONOSCOPY  2017   [facility]. Dr Newman Pies. Said they could see 15 polpys but didn't remove them per patient   . ESOPHAGOGASTRODUODENOSCOPY     in [place]. before 2009 per patient  . ganglion cyst removal    . HERNIA REPAIR     inguinal per patient   . KNEE ARTHROSCOPY Bilateral  Knee  . TOTAL KNEE ARTHROPLASTY Bilateral        Home Medications    Prior to Admission medications   Medication Sig Start Date End Date Taking? Authorizing Provider  Cyanocobalamin (VITAMIN B-12 PO) Take by mouth.    [provider]  DULoxetine (CYMBALTA) 30 MG capsule Take 1 capsule (30 mg total) by mouth daily. 04/21/17   Roxan Hockey, MD  Immune Globulin, Human, 0.5 GM/10ML SOLN Inject into the vein every 21 ( twenty-one) days.    [provider]  levETIRAcetam (KEPPRA) 500 MG tablet Take 1 tablet (500 mg total) by mouth 2 (two) times daily. 07/17/18   Patel, Arvin Collard K, DO  lisinopril (PRINIVIL,ZESTRIL) 5 MG tablet Take  5 mg by mouth daily.  07/04/17   [provider]  LORazepam (ATIVAN) 0.5 MG tablet TAKE 1 TABLET BY MOUTH DAILY 07/28/17   [provider]  Multiple Vitamin (MULTIVITAMIN WITH MINERALS) TABS tablet Take 1 tablet by mouth daily. 04/22/17   Roxan Hockey, MD  omeprazole (PRILOSEC) 40 MG capsule Take 40 mg by mouth daily. 06/06/18   [provider]  pravastatin (PRAVACHOL) 40 MG tablet Take 1 tablet (40 mg total) by mouth daily. 05/19/16   Tresa Garter, MD  Thiamine HCl (VITAMIN B-1 PO) Take 1 tablet by mouth daily.    [provider]  Thiamine HCl (VITAMIN B-1) 250 MG tablet Take 250 mg by mouth daily.    [provider]  traMADol (ULTRAM) 50 MG tablet Take 50 mg by mouth 2 (two) times daily.  07/29/17   [provider]  traZODone (DESYREL) 100 MG tablet Take 1 tablet (100 mg total) by mouth at bedtime. Patient taking differently: Take 100 mg by mouth at bedtime as needed for sleep.  04/21/17   Roxan Hockey, MD    Family History Family History  Problem Relation Age of Onset  . Cancer Mother 71       myosarcoma, ovarian  . Lung cancer Father   . Emphysema Father   . Colonic polyp Father   . Leukemia Sister 72  . Lung cancer Brother 81  . Brain cancer Brother   . Cancer Maternal Grandmother   . Colon cancer Neg Hx   . Colon polyps Neg Hx   . Esophageal cancer Neg Hx   . Rectal cancer Neg Hx   . Stomach cancer Neg Hx     Social History Social History   Tobacco Use  . Smoking status: Current Every Day Smoker    Packs/day: 0.25    Years: 40.00    Pack years: 10.00  . Smokeless tobacco: Never Used  . Tobacco comment: smoking 5 cigs/day (he rolls his own)  Substance Use Topics  . Alcohol use: Yes    Alcohol/week: 0.0 standard drinks    Comment: pt states "2 24oz daily when I can"  . Drug use: Yes    Types: Marijuana    Comment: weekly     Allergies   Codeine   Review of Systems Review of Systems  Reason unable  to perform ROS: See HPI as above.     Physical Exam Triage Vital Signs ED Triage Vitals  Enc Vitals Group     BP 07/20/18 1312 (!) 152/100     Pulse Rate 07/20/18 1312 86     Resp 07/20/18 1312 17     Temp 07/20/18 1312 98.4 F (36.9 C)     Temp Source 07/20/18 1312 Oral  SpO2 07/20/18 1312 100 %     Weight --      Height --      Head Circumference --      Peak Flow --      Pain Score 07/20/18 1310 8     Pain Loc --      Pain Edu? --      Excl. in [place]? --    No data found.  Updated Vital Signs BP (!) 152/100 (BP Location: Left Arm)   Pulse 86   Temp 98.4 F (36.9 C) (Oral)   Resp 17   SpO2 100%   Physical Exam Constitutional:      General: He is not in acute distress.    Appearance: He is well-developed. He is not diaphoretic.  HENT:     Head: Normocephalic and atraumatic.  Eyes:     Conjunctiva/sclera: Conjunctivae normal.     Pupils: Pupils are equal, round, and reactive to light.  Cardiovascular:     Rate and Rhythm: Normal rate and regular rhythm.     Heart sounds: Normal heart sounds. No murmur. No friction rub. No gallop.   Pulmonary:     Effort: Pulmonary effort is normal. No accessory muscle usage or respiratory distress.     Breath sounds: Normal breath sounds. No stridor. No decreased breath sounds, wheezing, rhonchi or rales.  Musculoskeletal:       Back:     Comments: Exam limited as patient sitting in wheelchair. Swelling with contusion to the low lumbar/sacrum region. Diffuse tenderness to palpation. Tenderness to palpation of left posterior hip region. Patient able to extend, flex, internal and external rotate bilateral hips while sitting in wheelchair. Strength was deferred. Sensation intact and equal bilaterally.    Skin:    General: Skin is warm and dry.  Neurological:     Mental Status: He is alert and oriented to person, place, and time.      UC Treatments / Results  Labs (all labs ordered are listed, but only abnormal results are  displayed) Labs Reviewed - No data to display  EKG None  Radiology Dg Lumbar Spine Complete  Result Date: 07/20/2018 CLINICAL DATA:  Pain following fall EXAM: LUMBAR SPINE - COMPLETE 4+ VIEW COMPARISON:  Lumbar radiographs Aug 12, 2017 and lumbar MRI April 21, 2018 FINDINGS: Frontal, lateral, spot lumbosacral lateral, and bilateral oblique views were obtained. There are 5 non-rib-bearing lumbar type vertebral bodies. There is lower lumbar levoscoliosis, stable. There is no demonstrable fracture or spondylolisthesis. There is disc space narrowing at L3-4 and L5-S1, similar to prior studies. Other disc spaces appear unremarkable. There is no appreciable facet arthropathy. There is aortic atherosclerosis. IMPRESSION: Stable disc space narrowing at L3-4 and L5-S1. Appearance is stable compared to prior studies. Lower lumbar levoscoliosis. No fracture or spondylolisthesis. No progression of arthropathy evident. Aortic Atherosclerosis (ICD10-I70.0). Electronically Signed   By: Lowella Grip III M.D.   On: 07/20/2018 14:01   Dg Pelvis 1-2 Views  Result Date: 07/20/2018 CLINICAL DATA:  Status post fall, pain EXAM: PELVIS - 1-2 VIEW COMPARISON:  None. FINDINGS: There is no evidence of pelvic fracture or diastasis. No pelvic bone lesions are seen. IMPRESSION: Negative. Electronically Signed   By: Kathreen Devoid   On: 07/20/2018 13:51    Procedures Procedures (including critical care time)  Medications Ordered in UC Medications - No data to display  Initial Impression / Assessment and Plan / UC Course  I have reviewed the triage vital signs and the  nursing notes.  Pertinent labs & imaging results that were available during my care of the patient were reviewed by me and considered in my medical decision making (see chart for details).    X-ray negative for fracture or dislocation.  Will have patient do ice compress to the hematoma for the next 3 days, they can switch to warm compress.   Ibuprofen/Tylenol for pain.  Continue to monitor symptoms.  Return precautions given.  Patient expresses understanding and agrees to plan.  Final Clinical Impressions(s) / UC Diagnoses   Final diagnoses:  Traumatic hematoma of lower back, initial encounter  Fall, initial encounter   ED Prescriptions    None        Ok Edwards, PA-C 07/20/18 1449

## 2018-07-20 NOTE — ED Triage Notes (Signed)
Patient presents to Urgent Care with complaints of lowe back pain since last night after falling onto his tailbone while getting up to use the bathroom. Patient states his tailbone is swollen and there is ecchymosis to his left buttcheek.

## 2018-07-20 NOTE — Telephone Encounter (Signed)
Patient called stating that he was getting out of his wheelchair to walk to the bathroom and fell.  He said that he is in a lot of pain and thinks he may have broken his hip.  Instructed him to go to UC to have xray done and call me back to let me know what is going on.

## 2018-08-16 ENCOUNTER — Telehealth: Payer: Self-pay | Admitting: Neurology

## 2018-08-16 NOTE — Telephone Encounter (Signed)
LVM to return call to the office.

## 2018-08-16 NOTE — Telephone Encounter (Signed)
Patient need to talk to someone about getting a walker

## 2018-08-17 NOTE — Telephone Encounter (Signed)
LVM--cannot in touch with the pt.

## 2018-08-18 ENCOUNTER — Ambulatory Visit: Payer: Medicaid Other | Admitting: Neurology

## 2018-09-06 ENCOUNTER — Telehealth: Payer: Self-pay | Admitting: Neurology

## 2018-09-06 NOTE — Telephone Encounter (Signed)
Patient is calling in about next appointment. He is falling a lot and I'm not sure if he needs an E-visit or in office. He was last seen on 07/17/18 doing a VV. Please let us know how to schedule. We need to call [phone] Room [phone] per patient. Thanks!

## 2018-09-06 NOTE — Telephone Encounter (Addendum)
Falling a lot.  Walks with cane  Unable to get walker- another Dr. Does not want him to have.  Not dizzy. Complains that legs just do not want to work. Legs "wobble"  States that he is taking medications as prescribed.

## 2018-09-07 NOTE — Telephone Encounter (Signed)
Pt is able to come at 3:00om on 09/14/18. Appt scheduled.

## 2018-09-07 NOTE — Telephone Encounter (Signed)
I will need to assess him in the office next week.

## 2018-09-08 ENCOUNTER — Ambulatory Visit: Payer: Medicaid Other | Admitting: Neurology

## 2018-09-12 ENCOUNTER — Telehealth: Payer: Self-pay | Admitting: Neurology

## 2018-09-12 NOTE — Telephone Encounter (Signed)
Patient left vm needing to speak with you about some questions he had. Thanks!

## 2018-09-12 NOTE — Telephone Encounter (Signed)
Left message for pt to return call .  Romney Rm [phone] -pts number

## 2018-09-14 ENCOUNTER — Ambulatory Visit: Payer: Medicaid Other | Admitting: Neurology

## 2018-09-21 ENCOUNTER — Ambulatory Visit: Payer: Medicaid Other | Admitting: Neurology

## 2018-09-21 ENCOUNTER — Other Ambulatory Visit: Payer: Self-pay

## 2018-09-21 ENCOUNTER — Ambulatory Visit (INDEPENDENT_AMBULATORY_CARE_PROVIDER_SITE_OTHER): Payer: Medicaid Other | Admitting: Neurology

## 2018-09-21 ENCOUNTER — Encounter: Payer: Self-pay | Admitting: Neurology

## 2018-09-21 VITALS — BP 121/90 | HR 90 | Temp 97.0°F | Ht 69.0 in | Wt 149.5 lb

## 2018-09-21 DIAGNOSIS — R278 Other lack of coordination: Secondary | ICD-10-CM

## 2018-09-21 DIAGNOSIS — G621 Alcoholic polyneuropathy: Secondary | ICD-10-CM | POA: Diagnosis not present

## 2018-09-21 DIAGNOSIS — G40909 Epilepsy, unspecified, not intractable, without status epilepticus: Secondary | ICD-10-CM | POA: Diagnosis not present

## 2018-09-21 DIAGNOSIS — G6181 Chronic inflammatory demyelinating polyneuritis: Secondary | ICD-10-CM | POA: Diagnosis not present

## 2018-09-21 NOTE — Patient Instructions (Addendum)
Start home physical therapy for balance training  Prescription for Rollator   Continue monthly IVIG  Continue Keppra 500mg  twice daily  Try to stop smoking  Return to clinic 4 months

## 2018-09-21 NOTE — Progress Notes (Signed)
Follow-up Visit   Date: 09/21/18    Cody Delacruz MRN: [id number] DOB: 1966-02-01   Interim History: Cody Delacruz is a 53 y.o. right-handed Caucasian male with alcohol abuse, tobacco use, depression, seizure disorder, GERD, alcoholic cirrhosis, and hyperlipidemia returning to the clinic for follow-up of polyradiculoneuropathy.  The patient was accompanied to the clinic by self.  History of present illness: Starting around early May 2019, he began having numbness and tingling of the feet and lower legs up to the level of the knees.  Over the next four weeks, he steadily developed greater difficulty with balance, weakness of the legs, and started having increased falls. He has been using a cane since 2011.  He was drinking (2) 24 oz beers daily x 10 years.  He usually uses a wheelchair because of chronic low back pain. In August 2019, he has NCS/EMG and CSF testing which was favoring polyradiculoneuropathy and therefore he was started on IVIG.  He began noticing distal improvement, however in early January, he developed acute worsening of left leg weakness, to the point where he was unable to raise the leg.  MRI lumbar spine did not show nerve impingement.  CT of the head was without stroke.  He underwent repeat EMG in February 2020 which showed improved polyradiculoneuropathy distally, and interval worsening proximally.  He has history of generalized seizures which started in 2001.  He takes depakote 522m three times.  He is noncompliant with medications and gets breakthrough seizures. He typically gets seizure once per month which lasts 5 minutes.  The last time he saw a neurologist [id number]. He reports being toxic on a previous AED, but does not recall the name.  He does not drive.  Due to chronic thrombocytopenia, I stopped his Depakote and started him on Keppra 500 mg twice daily in late 2019. He has been complaint with Keppra and seizures are less  frequent, occurring once every 1 to 2 months.  UPDATE 09/21/2018: He is here for follow-up visit.  He has abstained from alcohol for the past few months.  He continues to get IVIG and has improved strength in the legs.  He is still falling frequently because of severe imbalance, about 2-3 times per week.  He is able to stand up by himself.  Most of his falls occur when he is walking unassisted or with a cane when maneuvering around his hotel room.  He denies any new numbness or tingling.  He is requesting a Rollator to help when he is not using his scooter.   Medications:  Current Outpatient Medications on File Prior to Visit  Medication Sig Dispense Refill  . Cyanocobalamin (VITAMIN B-12 PO) Take by mouth.    . DULoxetine (CYMBALTA) 30 MG capsule Take 1 capsule (30 mg total) by mouth daily. 30 capsule 1  . Immune Globulin, Human, 0.5 GM/10ML SOLN Inject into the vein every 21 ( twenty-one) days.    .Marland KitchenlevETIRAcetam (KEPPRA) 500 MG tablet Take 1 tablet (500 mg total) by mouth 2 (two) times daily. 180 tablet 3  . lisinopril (PRINIVIL,ZESTRIL) 5 MG tablet Take 5 mg by mouth daily.   5  . LORazepam (ATIVAN) 0.5 MG tablet TAKE 1 TABLET BY MOUTH DAILY  1  . Multiple Vitamin (MULTIVITAMIN WITH MINERALS) TABS tablet Take 1 tablet by mouth daily. 30 tablet 0  . omeprazole (PRILOSEC) 40 MG capsule Take 40 mg by mouth daily.    . pravastatin (PRAVACHOL)  40 MG tablet Take 1 tablet (40 mg total) by mouth daily. 90 tablet 3  . traMADol (ULTRAM) 50 MG tablet Take 50 mg by mouth 2 (two) times daily.   5  . traZODone (DESYREL) 100 MG tablet Take 1 tablet (100 mg total) by mouth at bedtime. (Patient taking differently: Take 100 mg by mouth at bedtime as needed for sleep. ) 30 tablet 3   No current facility-administered medications on file prior to visit.     Allergies:  Allergies  Allergen Reactions  . Codeine Other (See Comments)    Unknown childhood allergy    Review of Systems:  CONSTITUTIONAL: No  fevers, chills, night sweats, or weight loss.  EYES: No visual changes or eye pain ENT: No hearing changes.  No history of nose bleeds.   RESPIRATORY: No cough, wheezing and shortness of breath.   CARDIOVASCULAR: Negative for chest pain, and palpitations.   GI: Negative for abdominal discomfort, blood in stools or black stools.  No recent change in bowel habits.   GU:  No history of incontinence.   MUSCLOSKELETAL: +history of joint pain or swelling.  No myalgias.   SKIN: Negative for lesions, rash, and itching.   ENDOCRINE: Negative for cold or heat intolerance, polydipsia or goiter.   PSYCH:  No depression or anxiety symptoms.   NEURO: As Above.   Vital Signs:  BP 121/90   Pulse 90   Temp (!) 97 F (36.1 C)   Ht '5\' 9"'  (1.753 m)   Wt 149 lb 8 oz (67.8 kg) Comment: patient reported  SpO2 98%   BMI 22.08 kg/m   Neurological Exam: MENTAL STATUS including orientation to time, place, person, recent and remote memory, attention span and concentration, language, and fund of knowledge is normal.  Speech is not dysarthric.  CRANIAL NERVES:    Face is symmetric.  MOTOR:  TA muscle bulk is improved, there is moderate bilateral quadriceps atrophy.   No pronator drift.  Tone is normal.    Right Upper Extremity:    Left Upper Extremity:    Deltoid  5/5   Deltoid  5/5   Biceps  5/5   Biceps  5/5   Triceps  5/5   Triceps  5/5   Wrist extensors  5/5   Wrist extensors  5/5   Wrist flexors  5/5   Wrist flexors  5/5   Finger extensors  5/5   Finger extensors  5/5   Finger flexors  5/5   Finger flexors  5/5   Dorsal interossei  5/5   Dorsal interossei  5/5   Abductor pollicis  5/5   Abductor pollicis  5/5   Tone (Ashworth scale)  0  Tone (Ashworth scale)  0   Right Lower Extremity:    Left Lower Extremity:    Hip flexors  5-/5   Hip flexors * 5-/5   Hip extensors  5/5   Hip extensors  5/5   Knee flexors  5/5   Knee flexors  5/5   Knee extensors  5/5   Knee extensors  5-/5   Dorsiflexors   5-/5   Dorsiflexors  5-/5   Plantarflexors  5-/5   Plantarflexors  5/5   Toe extensors  4/5   Toe extensors  4/5   Toe flexors  4/5   Toe flexors  4/5   Tone (Ashworth scale)  0  Tone (Ashworth scale)  0   MSRs:  Right        Left brachioradialis 3+  3+  biceps 2+  2+  triceps 2+  2+  patellar 2+  2+  ankle jerk 0  0  Hoffman no  no  plantar response down  down   COORDINATION/GAIT:  Severe sensory ataxia, with unsteady gait.  He is able to stand up from chair without using arms to push off (markedly improved)   Data: MRI lumbar spine wo contrast 08/18/2017: 1. Degenerative change of the lumbar spine without fracture or malalignment. 2. No canal stenosis. Mild to moderate RIGHT L5-S1 neural foraminal narrowing.  Labs 08/18/2017:  Vitamin B12 498, TSH 1.159, RPR neg, SPEP with IFE no M protein Labs 10/12/2017:  VPA <12, MMA 60, copper 70, vitamin B1 9, folate 16.5, ESR 8, CRP 0.1  NCS/EMG of the legs 10/25/2017:  The electrophysiologic findings are most consistent with a subacute and severe common peroneal neuropathy, demyelinating and axon loss in type, affecting bilateral lower extremities. Alternatively, a polyradiculoneuropathy can be considered given abnormal late responses.  Correlate clinically. There is no evidence of a large fiber sensorimotor polyneuropathy.  NCS/EMG of the legs 05/09/2018:  The electrophysiologic findings continue to show a polyradiculoneuropathy affecting bilateral lower extremities.  Compared to prior study on October 25, 2017, there has been a significant improvement below the knees, and interval worsening proximally.  CSF 11/14/2017:  R0 W0 P70 G67  No OCB, IgG index 0.44  Lyme neg, cytology neg  Lab Results  Component Value Date   CHOL 196 05/19/2016   HDL 60 05/19/2016   LDLCALC 105 (H) 05/19/2016   TRIG 154 (H) 05/19/2016   CHOLHDL 3.3 05/19/2016   MRI lumbar spine 04/21/2018: The dominant finding is central  and rightward protrusion at L5-S1. Slight mass effect on the RIGHT S1 nerve root. No compressive foraminal narrowing.   IMPRESSION/PLAN: 1.  Polyradiculoneuropathy, motor variant of CIDP.  Symptoms initially manifested with bilateral foot drop which then progressed with proximal leg weakness.  He has responded very well to IVIG and today's has significantly improved strength in the legs.  Left hip flexion was previously 2/5 and is 5-/5 today.  He does have severe sensory ataxia leading to his imbalance and falls.   I will continue him on IVIG 1 mg/KG every 21 days  Start home physical therapy for balance  Prescription provided for rollator  He no longer needs to use his AFOs         2.  Alcohol abuse contributing to neuropathy, sensory ataxia, and vitamin deficiency.  I praised the patient for abstaining from alcohol.  Continue thiamine 135m daily.  3.  Seizure disorder, continue keppra 5023mBID. Medication compliance stressed.   Return to clinic in 4 months    Thank you for allowing me to participate in patient's care.  If I can answer any additional questions, I would be pleased to do so.    Sincerely,    Donika K. PaPosey ProntoDO

## 2018-09-25 ENCOUNTER — Other Ambulatory Visit: Payer: Self-pay

## 2018-09-25 DIAGNOSIS — R278 Other lack of coordination: Secondary | ICD-10-CM

## 2018-09-25 DIAGNOSIS — G6181 Chronic inflammatory demyelinating polyneuritis: Secondary | ICD-10-CM

## 2018-09-25 DIAGNOSIS — G40909 Epilepsy, unspecified, not intractable, without status epilepticus: Secondary | ICD-10-CM

## 2018-09-25 DIAGNOSIS — G621 Alcoholic polyneuropathy: Secondary | ICD-10-CM

## 2018-09-25 NOTE — Progress Notes (Signed)
[facility] is not accecpting medicare patients at this time so new referral placed to [facility].

## 2018-09-27 ENCOUNTER — Telehealth: Payer: Self-pay | Admitting: Neurology

## 2018-09-27 NOTE — Telephone Encounter (Signed)
Patient left msg with after hours about prescription for the tramadol and walker. He is needing more info on this. Please call him at [phone]. Thanks!

## 2018-09-27 NOTE — Telephone Encounter (Signed)
Advised patient home health should be contacting him about his walker and that Dr Posey Pronto does not prescribe controlled pain meds.

## 2018-09-27 NOTE — Telephone Encounter (Signed)
Please inform patient that I have not written tramadol for him in the past.  He would need to request through his PCP, as I do not prescribe controlled pain medications.

## 2018-09-27 NOTE — Telephone Encounter (Signed)
Attempted to reach patient regarding his concern.  Order for walker was sent to [facility] home health who should be contacting patient.  Tramadol request has been sent to Dr Posey Pronto.

## 2018-10-05 ENCOUNTER — Telehealth: Payer: Self-pay | Admitting: Neurology

## 2018-10-05 NOTE — Telephone Encounter (Signed)
Left message for pt to contact [facility] for instructions   at [phone]

## 2018-10-05 NOTE — Telephone Encounter (Signed)
Left message with the after hour service on 10-05-18 @12 :55 pm    Caller stats DR Posey Pronto ordered PT to come to the house they did not show up at all. He needs to the number to PT place so he can call them please call him back with that information

## 2018-10-10 NOTE — Telephone Encounter (Signed)
Spoke with patient.  Advised him that we are waiting for [facility] to return our enquire about the referral.  Will contact patient again as soon as we get a response.

## 2018-10-10 NOTE — Telephone Encounter (Signed)
Patient is calling in again about walker and PT. Thanks!

## 2018-10-10 NOTE — Telephone Encounter (Signed)
Contacted patient to call [facility] referral has been placed, patient to call them

## 2018-10-10 NOTE — Telephone Encounter (Signed)
Patient has called [facility] and still has yet to get someone to come out for PT and does not have his walker. Please call him back at [phone]. Thanks!

## 2018-10-17 ENCOUNTER — Telehealth: Payer: Self-pay | Admitting: Neurology

## 2018-10-17 NOTE — Telephone Encounter (Signed)
Please call [facility] and see if they can assist with getting him a rollator?  He should be getting PT through them.  Otherwise, he can go to any medical supply store and pick up from there.

## 2018-10-17 NOTE — Telephone Encounter (Signed)
Left message with [facility] asked pt to contact them too and he could get rollator at any dme supply company.

## 2018-10-17 NOTE — Telephone Encounter (Signed)
Could you advise on this where else to get thanks in advance

## 2018-10-17 NOTE — Telephone Encounter (Signed)
Patient left msg with after hours about Dr. Posey Pronto prescribing a Rolator and he has not received it yet. Please call him back at [phone]. He's called in multiple times about this and we keep telling him to call [facility]. They are of no help to him. Can we please figure out what's going on or call for him? Thanks so much! He is just wanting to be able to get up walking.

## 2018-10-18 NOTE — Telephone Encounter (Signed)
Patient is wanting to get a prescription for the rollator sent to the address on file. Please! Thanks!

## 2018-10-18 NOTE — Telephone Encounter (Signed)
Left message for patient went to voicemail, patient will need to contact a DME company and schedule a visit. Due to covid restrictions, will mail order to patient out today

## 2018-10-27 ENCOUNTER — Telehealth: Payer: Self-pay | Admitting: Neurology

## 2018-10-27 NOTE — Telephone Encounter (Signed)
Patient left VM about wanting to speak with the nurse. Thanks!

## 2018-10-30 ENCOUNTER — Other Ambulatory Visit: Payer: Self-pay

## 2018-10-30 MED ORDER — AMBULATORY NON FORMULARY MEDICATION: 0 refills | Status: AC

## 2018-10-30 NOTE — Telephone Encounter (Signed)
Left message with after hour service on 10-27-18 4:38 pm   Caller states that he is supposed to get a walker ordered by Dr Posey Pronto the company states they need a fax from the Dr with his medicaid information. [employer] is the company and their fax number is [phone]

## 2018-10-30 NOTE — Telephone Encounter (Signed)
Sorry did not know Alyse Low was not her will one of you please take care of this

## 2018-10-30 NOTE — Telephone Encounter (Signed)
Please send the order to [employer] with the order # [id number]

## 2018-10-30 NOTE — Telephone Encounter (Signed)
Rollator Walker prescription printed. Placed on Dr. Serita Grit desk to sign.

## 2018-11-01 ENCOUNTER — Emergency Department (HOSPITAL_COMMUNITY): Payer: Medicaid Other

## 2018-11-01 ENCOUNTER — Emergency Department (HOSPITAL_COMMUNITY)
Admission: EM | Admit: 2018-11-01 | Discharge: 2018-11-01 | Disposition: A | Discharge status: Home / Self Care | Payer: Medicaid Other | Attending: Emergency Medicine | Admitting: Emergency Medicine

## 2018-11-01 ENCOUNTER — Other Ambulatory Visit: Payer: Self-pay

## 2018-11-01 ENCOUNTER — Encounter (HOSPITAL_COMMUNITY): Payer: Self-pay

## 2018-11-01 DIAGNOSIS — F17210 Nicotine dependence, cigarettes, uncomplicated: Secondary | ICD-10-CM | POA: Insufficient documentation

## 2018-11-01 DIAGNOSIS — Z96653 Presence of artificial knee joint, bilateral: Secondary | ICD-10-CM | POA: Diagnosis not present

## 2018-11-01 DIAGNOSIS — F10920 Alcohol use, unspecified with intoxication, uncomplicated: Secondary | ICD-10-CM | POA: Diagnosis not present

## 2018-11-01 DIAGNOSIS — Y908 Blood alcohol level of 240 mg/100 ml or more: Secondary | ICD-10-CM | POA: Diagnosis not present

## 2018-11-01 DIAGNOSIS — Z79899 Other long term (current) drug therapy: Secondary | ICD-10-CM | POA: Diagnosis not present

## 2018-11-01 DIAGNOSIS — M549 Dorsalgia, unspecified: Secondary | ICD-10-CM | POA: Diagnosis not present

## 2018-11-01 DIAGNOSIS — I10 Essential (primary) hypertension: Secondary | ICD-10-CM | POA: Insufficient documentation

## 2018-11-01 DIAGNOSIS — R569 Unspecified convulsions: Secondary | ICD-10-CM | POA: Diagnosis not present

## 2018-11-01 LAB — CBC WITH DIFFERENTIAL/PLATELET
Abs Immature Granulocytes: 0 10*3/uL (ref 0.00–0.07)
Basophils Absolute: 0 10*3/uL (ref 0.0–0.1)
Basophils Relative: 1 %
Eosinophils Absolute: 0.1 10*3/uL (ref 0.0–0.5)
Eosinophils Relative: 2 %
HCT: 39.9 % (ref 39.0–52.0)
Hemoglobin: 13.3 g/dL (ref 13.0–17.0)
Immature Granulocytes: 0 %
Lymphocytes Relative: 34 %
Lymphs Abs: 1.3 10*3/uL (ref 0.7–4.0)
MCH: 32.7 pg (ref 26.0–34.0)
MCHC: 33.3 g/dL (ref 30.0–36.0)
MCV: 98 fL (ref 80.0–100.0)
Monocytes Absolute: 0.4 10*3/uL (ref 0.1–1.0)
Monocytes Relative: 11 %
Neutro Abs: 2.1 10*3/uL (ref 1.7–7.7)
Neutrophils Relative %: 52 %
Platelets: 92 10*3/uL — ABNORMAL LOW (ref 150–400)
RBC: 4.07 MIL/uL — ABNORMAL LOW (ref 4.22–5.81)
RDW: 12.8 % (ref 11.5–15.5)
WBC: 4 10*3/uL (ref 4.0–10.5)
nRBC: 0 % (ref 0.0–0.2)

## 2018-11-01 LAB — COMPREHENSIVE METABOLIC PANEL
ALT: 46 U/L — ABNORMAL HIGH (ref 0–44)
AST: 100 U/L — ABNORMAL HIGH (ref 15–41)
Albumin: 3.8 g/dL (ref 3.5–5.0)
Alkaline Phosphatase: 54 U/L (ref 38–126)
Anion gap: 9 (ref 5–15)
BUN: 7 mg/dL (ref 6–20)
CO2: 23 mmol/L (ref 22–32)
Calcium: 8 mg/dL — ABNORMAL LOW (ref 8.9–10.3)
Chloride: 105 mmol/L (ref 98–111)
Creatinine, Ser: 0.63 mg/dL (ref 0.61–1.24)
GFR calc Af Amer: >60 mL/min (ref >60)
GFR calc non Af Amer: >60 mL/min (ref >60)
Glucose, Bld: 92 mg/dL (ref 70–99)
Potassium: 3.8 mmol/L (ref 3.5–5.1)
Sodium: 137 mmol/L (ref 135–145)
Total Bilirubin: 0.4 mg/dL (ref 0.3–1.2)
Total Protein: 9.3 g/dL — ABNORMAL HIGH (ref 6.5–8.1)

## 2018-11-01 LAB — ETHANOL: Alcohol, Ethyl (B): 333 mg/dL (ref <10)

## 2018-11-01 MED ORDER — LEVETIRACETAM 750 MG PO TABS: 750.0000 mg | ORAL_TABLET | Freq: Two times a day (BID) | ORAL | 0 refills | Status: DC

## 2018-11-01 MED ORDER — LEVETIRACETAM IN NACL 1000 MG/100ML IV SOLN
1000.0000 mg | Freq: Once | INTRAVENOUS | Status: AC
  Administered 2018-11-01: 1000 mg via INTRAVENOUS
  Filled 2018-11-01: qty 100

## 2018-11-01 NOTE — ED Provider Notes (Signed)
[facility] DEPT Provider Note   CSN: [id number] Arrival date & time: 11/01/18  0947    History   Chief Complaint Chief Complaint  Patient presents with   Seizures    HPI Child Cody Delacruz is a 53 y.o. male with a past medical history of alcohol abuse, hypertension, hyperlipidemia, epilepsy, CIDP, cirrhosis who presents to ED for seizure.  Patient states that he had about 2 seizures that were witnessed by his neighbor earlier today.  He had a seizure in route for which she was given 2.5 mg of Versed.  States that he has been compliant with his Keppra include taking it this morning.  States that he has had seizures since 2001.  He was previously on Depakote and was put on Keppra about 2 months ago by his neurologist.  He reports daily alcohol use, 2-4 beers daily.  States that he had 2 beers this morning.  He states that he is feeling sleepy but denies any headache, neck pain, fever, chest pain, shortness of breath, vomiting, numbness in arms or legs.  He does complain about his chronic back pain due to to his CIDP.     HPI  Past Medical History:  Diagnosis Date   Anxiety    Arthritis    Back pain    CIDP (chronic inflammatory demyelinating polyneuropathy) (HCC)    does therapy at house every 3 weeks per patient. immunotherapy. Next one is 03/29/2018   Cirrhosis ([place])    due to combination of hep C and alcohol   Colon polyp    Depression    Epilepsy, unspecified, not intractable, without status epilepticus ([place])    Epilepsy, unspecified, not intractable, without status epilepticus ([place])    GERD (gastroesophageal reflux disease)    Hemorrhoids    History of hepatitis C    did 12 month of therapy per patient    Hypercholesteremia 03/2017   210   Hyperlipidemia    Hypertension    Neck pain    Other acariasis    Hep C   PTSD (post-traumatic stress disorder)    Rectal bleeding    Seizures ([place])    last seizure approx 03/22/2018     Tubular adenoma of colon 2017    Patient Active Problem List   Diagnosis Date Noted   Seizures ([place])    GI bleed 04/20/2017   Rectal bleeding 04/19/2017   Colitis 04/19/2017   Rectal bleed 11/18/2016   Dental abscess 09/01/2016   Moderate episode of recurrent major depressive disorder ([place]) 09/01/2016   Smoking addiction 09/01/2016   Depression 08/08/2016   Tobacco abuse 08/08/2016   Alcohol abuse 08/08/2016   Seizure ([place]) 50/53/9767   Alcoholic intoxication with complication ([place])    Numbness and tingling in both hands 01/22/2016   Chronic pain syndrome 01/22/2016   Assistance needed for mobility 01/22/2016   Healthcare maintenance 03/10/2015   Dyslipidemia 08/26/2014   Bilateral low back pain with left-sided sciatica 02/21/2014   Hepatitis C antibody test positive 05/28/2013   GERD (gastroesophageal reflux disease) 05/01/2013   Seizure disorder ([place]) 05/01/2013   Neck pain, chronic 10/31/2012   Chronic bilateral low back pain with left-sided sciatica 10/31/2012    Past Surgical History:  Procedure Laterality Date   COLONOSCOPY  2017   [facility]. Dr Newman Pies. Said they could see 15 polpys but didn't remove them per patient    ESOPHAGOGASTRODUODENOSCOPY     in [place]. before 2009 per patient   ganglion cyst removal  HERNIA REPAIR     inguinal per patient    KNEE ARTHROSCOPY Bilateral    Knee   TOTAL KNEE ARTHROPLASTY Bilateral         Home Medications    Prior to Admission medications   Medication Sig Start Date End Date Taking? Authorizing Provider  AMBULATORY NON FORMULARY MEDICATION Rollator Walker 10/30/18  Yes Patel, Donika K, DO  Immune Globulin, Human, 0.5 GM/10ML SOLN Inject into the vein every 21 ( twenty-one) days.   Yes [provider]  lisinopril (PRINIVIL,ZESTRIL) 5 MG tablet Take 5 mg by mouth daily.  07/04/17  Yes [provider]  LORazepam (ATIVAN) 0.5 MG tablet Take 0.5 mg by mouth  daily.  07/28/17  Yes [provider]  pravastatin (PRAVACHOL) 40 MG tablet Take 1 tablet (40 mg total) by mouth daily. 05/19/16  Yes Tresa Garter, MD  DULoxetine (CYMBALTA) 30 MG capsule Take 1 capsule (30 mg total) by mouth daily. Patient not taking: Reported on 11/01/2018 04/21/17   Roxan Hockey, MD  levETIRAcetam (KEPPRA) 750 MG tablet Take 1 tablet (750 mg total) by mouth 2 (two) times daily. 11/01/18   Pratik Dalziel, PA-C  Multiple Vitamin (MULTIVITAMIN WITH MINERALS) TABS tablet Take 1 tablet by mouth daily. Patient not taking: Reported on 11/01/2018 04/22/17   Roxan Hockey, MD  traZODone (DESYREL) 100 MG tablet Take 1 tablet (100 mg total) by mouth at bedtime. Patient not taking: Reported on 11/01/2018 04/21/17   Roxan Hockey, MD    Family History Family History  Problem Relation Age of Onset   Cancer Mother 1       myosarcoma, ovarian   Lung cancer Father    Emphysema Father    Colonic polyp Father    Leukemia Sister 46   Lung cancer Brother 58   Brain cancer Brother    Cancer Maternal Grandmother    Colon cancer Neg Hx    Colon polyps Neg Hx    Esophageal cancer Neg Hx    Rectal cancer Neg Hx    Stomach cancer Neg Hx     Social History Social History   Tobacco Use   Smoking status: Current Every Day Smoker    Packs/day: 0.25    Years: 40.00    Pack years: 10.00   Smokeless tobacco: Never Used   Tobacco comment: smoking 5 cigs/day (he rolls his own)  Substance Use Topics   Alcohol use: Yes    Alcohol/week: 0.0 standard drinks    Comment: pt states "2 24oz daily when I can"   Drug use: Yes    Types: Marijuana    Comment: weekly - not currently (last time in 30s per patient)     Allergies   Patient has no known allergies.   Review of Systems Review of Systems  Constitutional: Negative for appetite change, chills and fever.  HENT: Negative for ear pain, rhinorrhea, sneezing and sore throat.   Eyes: Negative for  photophobia and visual disturbance.  Respiratory: Negative for cough, chest tightness, shortness of breath and wheezing.   Cardiovascular: Negative for chest pain and palpitations.  Gastrointestinal: Negative for abdominal pain, blood in stool, constipation, diarrhea, nausea and vomiting.  Genitourinary: Negative for dysuria, hematuria and urgency.  Musculoskeletal: Negative for myalgias.  Skin: Negative for rash.  Neurological: Positive for seizures. Negative for dizziness, weakness and light-headedness.     Physical Exam Updated Vital Signs BP 124/89    Pulse 73    Temp 97.7 F (36.5 C) (Oral)  Resp 16    SpO2 96%   Physical Exam Vitals signs and nursing note reviewed.  Constitutional:      General: He is not in acute distress.    Appearance: He is well-developed.  HENT:     Head: Normocephalic and atraumatic.     Nose: Nose normal.  Eyes:     General: No scleral icterus.       Right eye: No discharge.        Left eye: No discharge.     Conjunctiva/sclera: Conjunctivae normal.     Pupils: Pupils are equal, round, and reactive to light.  Neck:     Musculoskeletal: Normal range of motion and neck supple.  Cardiovascular:     Rate and Rhythm: Normal rate and regular rhythm.     Heart sounds: Normal heart sounds. No murmur. No friction rub. No gallop.   Pulmonary:     Effort: Pulmonary effort is normal. No respiratory distress.     Breath sounds: Normal breath sounds.  Abdominal:     General: Bowel sounds are normal. There is no distension.     Palpations: Abdomen is soft.     Tenderness: There is no abdominal tenderness. There is no guarding.  Musculoskeletal: Normal range of motion.     Comments: No midline spinal tenderness present in lumbar, thoracic or cervical spine. No step-off palpated. No visible bruising, edema or temperature change noted. No objective signs of numbness present. No saddle anesthesia.   Skin:    General: Skin is warm and dry.     Findings: No  rash.  Neurological:     General: No focal deficit present.     Mental Status: He is alert and oriented to person, place, and time.     Cranial Nerves: No cranial nerve deficit.     Sensory: No sensory deficit.     Motor: No weakness or abnormal muscle tone.     Coordination: Coordination normal.     Comments: Pupils reactive. No facial asymmetry noted. Cranial nerves appear grossly intact. Sensation intact to light touch on face, BUE and BLE. Strength 5/5 in BUE and BLE.       ED Treatments / Results  Labs (all labs ordered are listed, but only abnormal results are displayed) Labs Reviewed  CBC WITH DIFFERENTIAL/PLATELET - Abnormal; Notable for the following components:      Result Value   RBC 4.07 (*)    Platelets 92 (*)    All other components within normal limits  COMPREHENSIVE METABOLIC PANEL - Abnormal; Notable for the following components:   Calcium 8.0 (*)    Total Protein 9.3 (*)    AST 100 (*)    ALT 46 (*)    All other components within normal limits  ETHANOL - Abnormal; Notable for the following components:   Alcohol, Ethyl (B) 333 (*)    All other components within normal limits  CBG MONITORING, ED    EKG EKG Interpretation  Date/Time:  Wednesday November 01 2018 12:09:13 EDT Ventricular Rate:  74 PR Interval:    QRS Duration: 92 QT Interval:  382 QTC Calculation: 424 R Axis:   57 Text Interpretation:  Sinus rhythm similar to prior 5/18 Confirmed by Aletta Edouard [phone]) on 11/01/2018 12:19:28 PM   Radiology Dg Chest 2 View  Result Date: 11/01/2018 CLINICAL DATA:  Recent seizure activity EXAM: CHEST - 2 VIEW COMPARISON:  08/08/2016 FINDINGS: The heart size and mediastinal contours are within normal limits. Both lungs are  clear. The visualized skeletal structures are unremarkable. IMPRESSION: No active cardiopulmonary disease. Electronically Signed   By: Inez Catalina M.D.   On: 11/01/2018 11:02   Ct Head Wo Contrast  Result Date: 11/01/2018 CLINICAL  DATA:  Dizziness EXAM: CT HEAD WITHOUT CONTRAST CT CERVICAL SPINE WITHOUT CONTRAST TECHNIQUE: Multidetector CT imaging of the head and cervical spine was performed following the standard protocol without intravenous contrast. Multiplanar CT image reconstructions of the cervical spine were also generated. COMPARISON:  04/21/2018, 12/17/2017 FINDINGS: CT HEAD FINDINGS Brain: No evidence of acute infarction, hemorrhage, hydrocephalus, extra-axial collection or mass lesion/mass effect. Similar mild diffuse cerebral volume loss. Vascular: No hyperdense vessel or unexpected calcification. Skull: Normal. Negative for fracture or focal lesion. Sinuses/Orbits: No acute finding. Other: None. CT CERVICAL SPINE FINDINGS Alignment: Normal. Skull base and vertebrae: No acute fracture. No primary bone lesion or focal pathologic process. Soft tissues and spinal canal: No prevertebral fluid or swelling. No visible canal hematoma. Disc levels: Similar mild multilevel degenerative changes including advanced left-sided facet and uncovertebral arthropathy at the C3-4 level significantly narrows the left neural foramen. Upper chest: Negative. Other: Scattered carotid artery atherosclerotic calcifications. IMPRESSION: 1. No acute intracranial abnormality. 2. No acute fracture subluxation of the cervical spine. 3. Similar mild diffuse cerebral volume loss. 4. Mild cervical spondylosis, most pronounced at C3-4 on the left. Electronically Signed   By: Davina Poke M.D.   On: 11/01/2018 12:20   Ct Cervical Spine Wo Contrast  Result Date: 11/01/2018 CLINICAL DATA:  Dizziness EXAM: CT HEAD WITHOUT CONTRAST CT CERVICAL SPINE WITHOUT CONTRAST TECHNIQUE: Multidetector CT imaging of the head and cervical spine was performed following the standard protocol without intravenous contrast. Multiplanar CT image reconstructions of the cervical spine were also generated. COMPARISON:  04/21/2018, 12/17/2017 FINDINGS: CT HEAD FINDINGS Brain: No  evidence of acute infarction, hemorrhage, hydrocephalus, extra-axial collection or mass lesion/mass effect. Similar mild diffuse cerebral volume loss. Vascular: No hyperdense vessel or unexpected calcification. Skull: Normal. Negative for fracture or focal lesion. Sinuses/Orbits: No acute finding. Other: None. CT CERVICAL SPINE FINDINGS Alignment: Normal. Skull base and vertebrae: No acute fracture. No primary bone lesion or focal pathologic process. Soft tissues and spinal canal: No prevertebral fluid or swelling. No visible canal hematoma. Disc levels: Similar mild multilevel degenerative changes including advanced left-sided facet and uncovertebral arthropathy at the C3-4 level significantly narrows the left neural foramen. Upper chest: Negative. Other: Scattered carotid artery atherosclerotic calcifications. IMPRESSION: 1. No acute intracranial abnormality. 2. No acute fracture subluxation of the cervical spine. 3. Similar mild diffuse cerebral volume loss. 4. Mild cervical spondylosis, most pronounced at C3-4 on the left. Electronically Signed   By: Davina Poke M.D.   On: 11/01/2018 12:20    Procedures Procedures (including critical care time)  Medications Ordered in ED Medications  levETIRAcetam (KEPPRA) IVPB 1000 mg/100 mL premix (0 mg Intravenous Stopped 11/01/18 1231)     Initial Impression / Assessment and Plan / ED Course  I have reviewed the triage vital signs and the nursing notes.  Pertinent labs & imaging results that were available during my care of the patient were reviewed by me and considered in my medical decision making (see chart for details).        53 year old male with past medical history of epilepsy presents to ED for seizure.  He had a seizure earlier today and then 1 with EMS for which she was given Versed 4.  He reports compliance with his home medication.  He has suffered  from seizures since 2001.  He denies any tongue trauma or incontinence.  Reports daily  alcohol use with last use being this morning, 2 beers.  On exam patient has no deficits neurological exam noted.  He does appear intoxicated.  Lab work significant for EtOH level of 333, LFTs slightly elevated at 146, AST and ALT effectively.  EKG similar to priors.  CBC unremarkable.  CT of the head and neck is unremarkable.  Chest x-ray is unremarkable.  Patient was given IV loading dose of Keppra and observed for 5 hours here with no recurrence of seizures.  I spoke to Dr. Malen Gauze of neurology who recommends increasing Keppra to 750 twice daily with neurology follow-up.  We will have him follow-up with neurology and PCP and return for worsening symptoms.  Patient is hemodynamically stable, in NAD, and able to ambulate in the ED. Evaluation does not show pathology that would require ongoing emergent intervention or inpatient treatment. I explained the diagnosis to the patient. Pain has been managed and has no complaints prior to discharge. Patient is comfortable with above plan and is stable for discharge at this time. All questions were answered prior to disposition. Strict return precautions for returning to the ED were discussed. Encouraged follow up with PCP.   An After Visit Summary was printed and given to the patient.   Portions of this note were generated with [employer]. Dictation errors may occur despite best attempts at proofreading.   Final Clinical Impressions(s) / ED Diagnoses   Final diagnoses:  Seizure [facility])    ED Discharge Orders         Ordered    levETIRAcetam (KEPPRA) 750 MG tablet  2 times daily     11/01/18 1440           Delia Heady, PA-C 11/01/18 1451    Hayden Rasmussen, MD 11/01/18 1704

## 2018-11-01 NOTE — ED Notes (Signed)
Patient called friend that was able to come pick him up

## 2018-11-01 NOTE — ED Notes (Addendum)
Date and time results received: 11/01/18 11:14 AM  (use smartphrase ".now" to insert current time)  Test: ETOh Critical Value: 333  Name of Provider Notified: Hina PA  Orders Received? Or Actions Taken?:

## 2018-11-01 NOTE — Discharge Instructions (Signed)
Begin taking Keppra 750 twice a day starting tomorrow. Return to the ED if you start to have worsening symptoms, additional seizures, head injury, loss of consciousness, blurry vision or numbness in arms or legs. Please call your neurologist to schedule an appointment.

## 2018-11-01 NOTE — Telephone Encounter (Signed)
Walker script faxed to [facility]

## 2018-11-01 NOTE — ED Triage Notes (Signed)
EMS reports from home, seizure, hx of. Pt states co,pliant with medication (Keppra).  BP 126/90 HR 92 RR 16 Sp02 96 RA  CBG 116 Temp 97.7  18ga L forearm  2.5mg  Midazolam enroute

## 2018-11-30 DIAGNOSIS — M720 Palmar fascial fibromatosis [Dupuytren]: Secondary | ICD-10-CM | POA: Insufficient documentation

## 2018-12-08 ENCOUNTER — Other Ambulatory Visit: Payer: Self-pay

## 2018-12-29 ENCOUNTER — Telehealth: Payer: Self-pay | Admitting: Neurology

## 2018-12-29 NOTE — Telephone Encounter (Signed)
Called and left patient a message to call back to reschedule his cancelled appointment on 01/26/2019 for follow up with Dr. Posey Pronto.

## 2019-01-02 NOTE — Telephone Encounter (Signed)
02/05/19 at 9:30 AM

## 2019-01-26 ENCOUNTER — Ambulatory Visit: Payer: Medicaid Other | Admitting: Neurology

## 2019-02-05 ENCOUNTER — Other Ambulatory Visit: Payer: Self-pay

## 2019-02-05 ENCOUNTER — Ambulatory Visit (INDEPENDENT_AMBULATORY_CARE_PROVIDER_SITE_OTHER): Payer: Medicaid Other | Admitting: Neurology

## 2019-02-05 ENCOUNTER — Encounter: Payer: Self-pay | Admitting: Neurology

## 2019-02-05 VITALS — BP 99/61 | HR 98 | Ht 69.0 in | Wt 157.0 lb

## 2019-02-05 DIAGNOSIS — R278 Other lack of coordination: Secondary | ICD-10-CM | POA: Diagnosis not present

## 2019-02-05 DIAGNOSIS — G6181 Chronic inflammatory demyelinating polyneuritis: Secondary | ICD-10-CM | POA: Diagnosis not present

## 2019-02-05 DIAGNOSIS — G40909 Epilepsy, unspecified, not intractable, without status epilepticus: Secondary | ICD-10-CM

## 2019-02-05 MED ORDER — LEVETIRACETAM 750 MG PO TABS: 750.0000 mg | ORAL_TABLET | Freq: Two times a day (BID) | ORAL | 11 refills | Status: DC

## 2019-02-05 NOTE — Progress Notes (Signed)
Follow-up Visit   Date: 02/05/19    Kwan Shellhammer MRN: [id number] DOB: Dec 22, 1965   Interim History: Garhett Bernhard is a 53 y.o. right-handed Caucasian male with alcohol abuse, tobacco use, depression, seizure disorder, GERD, alcoholic cirrhosis, and hyperlipidemia returning to the clinic for follow-up of polyradiculoneuropathy.  The patient was accompanied to the clinic by self.  History of present illness: Starting around early May 2019, he began having numbness and tingling of the feet and lower legs up to the level of the knees.  Over the next four weeks, he steadily developed greater difficulty with balance, weakness of the legs, and started having increased falls. He has been using a cane since 2011.  He was drinking (2) 24 oz beers daily x 10 years.  He usually uses a wheelchair because of chronic low back pain. In August 2019, he has NCS/EMG and CSF testing which was favoring polyradiculoneuropathy and therefore he was started on IVIG.  He began noticing distal improvement, however in early January, he developed acute worsening of left leg weakness, to the point where he was unable to raise the leg.  MRI lumbar spine did not show nerve impingement.  CT of the head was without stroke.  He underwent repeat EMG in February 2020 which showed improved polyradiculoneuropathy distally, and interval worsening proximally.  He has history of generalized seizures which started in 2001.  He takes depakote 544m three times.  He is noncompliant with medications and gets breakthrough seizures. He typically gets seizure once per month which lasts 5 minutes.  The last time he saw a neurologist [id number]. He reports being toxic on a previous AED, but does not recall the name.  He does not drive.  Due to chronic thrombocytopenia, I stopped his Depakote and started him on Keppra 500 mg twice daily in late 2019. He has been complaint with Keppra and seizures are less  frequent, occurring once every 1 to 2 months.  UPDATE 02/05/2019: He is here for follow-up visit.  Continues to do well with respect to his leg weakness and no longer uses AFOs.  His major concern is ongoing imbalance, which there has been no improvement with despite IVIG and physical therapy.  He has not developed any new weakness of the hands or legs.  He continues to have numbness in the lower legs and feet.  He continues to avoid alcohol, although admits to having a beer once in a while.  He is falling about once every 2 months and is able to stand up by himself.  He had a breakthrough seizure in August and was seen in the emergency department (notes reviewed) where his Keppra was increased to 750 mg twice daily.  He denies skipping his medications or drinking alcohol when this occurred.    Medications:  Current Outpatient Medications on File Prior to Visit  Medication Sig Dispense Refill  . AMBULATORY NON FORMULARY MEDICATION Rollator Walker 1 Product 0  . Immune Globulin, Human, 0.5 GM/10ML SOLN Inject into the vein every 21 ( twenty-one) days.    .Marland KitchenlevETIRAcetam (KEPPRA) 750 MG tablet Take 1 tablet (750 mg total) by mouth 2 (two) times daily. 30 tablet 0  . lisinopril (PRINIVIL,ZESTRIL) 5 MG tablet Take 5 mg by mouth daily.   5  . LORazepam (ATIVAN) 0.5 MG tablet Take 0.5 mg by mouth daily.   1  . pravastatin (PRAVACHOL) 40 MG tablet Take 1 tablet (40 mg total) by  mouth daily. 90 tablet 3   No current facility-administered medications on file prior to visit.     Allergies:  No Known Allergies  Review of Systems:  CONSTITUTIONAL: No fevers, chills, night sweats, or weight loss.  EYES: No visual changes or eye pain ENT: No hearing changes.  No history of nose bleeds.   RESPIRATORY: No cough, wheezing and shortness of breath.   CARDIOVASCULAR: Negative for chest pain, and palpitations.   GI: Negative for abdominal discomfort, blood in stools or black stools.  No recent change in bowel  habits.   GU:  No history of incontinence.   MUSCLOSKELETAL: +history of joint pain or swelling.  No myalgias.   SKIN: Negative for lesions, rash, and itching.   ENDOCRINE: Negative for cold or heat intolerance, polydipsia or goiter.   PSYCH:  No depression or anxiety symptoms.   NEURO: As Above.   Vital Signs:  BP 99/61   Pulse 98   Ht '5\' 9"'  (1.753 m)   Wt 157 lb (71.2 kg)   SpO2 97%   BMI 23.18 kg/m   Neurological Exam: MENTAL STATUS including orientation to time, place, person, recent and remote memory, attention span and concentration, language, and fund of knowledge is normal.  Speech is not dysarthric.  CRANIAL NERVES:    Face is symmetric.  MOTOR:  TA muscle bulk is improved, there is moderate bilateral quadriceps atrophy.   No pronator drift.  Tone is normal.    Right Upper Extremity:    Left Upper Extremity:    Deltoid  5/5   Deltoid  5/5   Biceps  5/5   Biceps  5/5   Triceps  5/5   Triceps  5/5   Wrist extensors  5/5   Wrist extensors  5/5   Wrist flexors  5/5   Wrist flexors  5/5   Finger extensors  5/5   Finger extensors  5/5   Finger flexors  5/5   Finger flexors  5/5   Dorsal interossei  5/5   Dorsal interossei  5/5   Abductor pollicis  5/5   Abductor pollicis  5/5   Tone (Ashworth scale)  0  Tone (Ashworth scale)  0   Right Lower Extremity:    Left Lower Extremity:    Hip flexors  5/5   Hip flexors * 5/5   Hip extensors  5/5   Hip extensors  5/5   Knee flexors  5/5   Knee flexors  5/5   Knee extensors  5/5   Knee extensors  5/5   Dorsiflexors  5/5   Dorsiflexors  5/5   Plantarflexors  5/5   Plantarflexors  5/5   Toe extensors  5-/5   Toe extensors  5-/5   Toe flexors  5-/5   Toe flexors  5-/5   Tone (Ashworth scale)  0  Tone (Ashworth scale)  0   MSRs:                                           Right        Left brachioradialis 3+  3+  biceps 2+  2+  triceps 2+  2+  patellar 2+  2+  ankle jerk 0  0  Hoffman no  no  plantar response down  down    COORDINATION/GAIT:  Severe sensory ataxia, with unsteady gait.   Data: MRI  lumbar spine wo contrast 08/18/2017: 1. Degenerative change of the lumbar spine without fracture or malalignment. 2. No canal stenosis. Mild to moderate RIGHT L5-S1 neural foraminal narrowing.  Labs 08/18/2017:  Vitamin B12 498, TSH 1.159, RPR neg, SPEP with IFE no M protein Labs 10/12/2017:  VPA <12, MMA 60, copper 70, vitamin B1 9, folate 16.5, ESR 8, CRP 0.1  NCS/EMG of the legs 10/25/2017:  The electrophysiologic findings are most consistent with a subacute and severe common peroneal neuropathy, demyelinating and axon loss in type, affecting bilateral lower extremities. Alternatively, a polyradiculoneuropathy can be considered given abnormal late responses.  Correlate clinically. There is no evidence of a large fiber sensorimotor polyneuropathy.  NCS/EMG of the legs 05/09/2018:  The electrophysiologic findings continue to show a polyradiculoneuropathy affecting bilateral lower extremities.  Compared to prior study on October 25, 2017, there has been a significant improvement below the knees, and interval worsening proximally.  CSF 11/14/2017:  R0 W0 P70 G67  No OCB, IgG index 0.44  Lyme neg, cytology neg  Lab Results  Component Value Date   CHOL 196 05/19/2016   HDL 60 05/19/2016   LDLCALC 105 (H) 05/19/2016   TRIG 154 (H) 05/19/2016   CHOLHDL 3.3 05/19/2016   MRI lumbar spine 04/21/2018: The dominant finding is central and rightward protrusion at L5-S1. Slight mass effect on the RIGHT S1 nerve root. No compressive foraminal narrowing.   IMPRESSION/PLAN: 1.  Polyradiculoneuropathy, motor variant of CIDP (2019).  Symptoms initially manifested with bilateral foot drop which then progressed with proximal leg weakness.  He has responded very well to IVIG and today's has significantly improved strength in the legs.  Left hip flexion was previously 2/5 and is 5/5 today.  He continues to have severe sensory ataxia which  will be permenant neurological deficits, since there as been no improvement despite IVIG. Start to taper IVIG to 84m/kg every 4 weeks x 2 months, then every 6 week.  I will contact [facility] to make these changes Fall precautions discussed, he is compliant with home exercises and using walker/motorized chair         2.  Alcohol abuse contributing to neuropathy, sensory ataxia, and vitamin deficiency.  I praised the patient for abstaining from alcohol.  Continue thiamine 1046mdaily.  3.  Seizure disorder, continue keppra 75027mID. Medication compliance stressed.   Return to clinic in 6 months   Thank you for allowing me to participate in patient's care.  If I can answer any additional questions, I would be pleased to do so.    Sincerely,     K. PatPosey ProntoO

## 2019-02-05 NOTE — Patient Instructions (Signed)
I will start to reduce your IVIG to every 4 weeks for 2 months, then reduce to every 6 weeks  Continue Keppra 750mg  twice daily  I will see you back in 6 months

## 2019-02-07 ENCOUNTER — Telehealth: Payer: Self-pay | Admitting: Neurology

## 2019-02-07 NOTE — Telephone Encounter (Signed)
Patient called requesting assistance with his home infusion he gets from [facility]. He said the directions have changed but they do not have the new orders yet.

## 2019-02-07 NOTE — Telephone Encounter (Signed)
[facility] has been notified of the change. Left message for patient on his voicemail

## 2019-02-27 ENCOUNTER — Telehealth: Payer: Self-pay | Admitting: Neurology

## 2019-02-27 NOTE — Telephone Encounter (Signed)
Called patient and left a message to call back re: move up from april till january per patel. for office visit here

## 2019-02-28 NOTE — Telephone Encounter (Signed)
Patient wants to know if he should get the [place] when it is ready? He states that he was told to get it when he was getting his infusion but wants to know what you think Dr Posey Pronto

## 2019-02-28 NOTE — Telephone Encounter (Signed)
Please inform patient that the data on COVID vaccine in immunosuppressed individuals with autoimmune disease, like CIDP, suggests that it will be more important to get the vaccine and develop immunity to COVID19, than no immunity.  Overall the data is still evolving and hopefully we will get better guidelines on patients with autoimmune disease in the coming weeks.

## 2019-02-28 NOTE — Telephone Encounter (Signed)
Left message on voice mail for patient.

## 2019-02-28 NOTE — Telephone Encounter (Signed)
Please advise per Patient, he wants your opinion

## 2019-03-02 NOTE — Telephone Encounter (Signed)
Called patient and left a message to call back re: move up from april till january per patel. for office visit here, in office.

## 2019-03-06 NOTE — Telephone Encounter (Signed)
Called patient and left a message to call back re: moveup from april till january per patel. for office visit here, in office.

## 2019-03-06 NOTE — Telephone Encounter (Signed)
Should patient have infusion every 3 or 4 weeks?  Janett Billow with [facility] [phone] is saying 3 weeks but patient thinks its 4 weeks.  Per intolerance infusion nurse suggests 3 week infusion.  Patient contact [facility] [phone]. room 308  If therapy is changed to every 3 weeks new order needs to be faxed [phone]  Janett Billow says that the 4th week he had a seizure and multiple falls.  They were supposed to send documentation but they have not received an order for therapy change.  Please advise

## 2019-03-06 NOTE — Telephone Encounter (Signed)
New orders have been faxed for him to get IVIG every 3 weeks.

## 2019-03-06 NOTE — Telephone Encounter (Signed)
Informed Cody Delacruz with [facility] that IVIG is every 3 weeks, left message for patient to call to let him know.

## 2019-03-06 NOTE — Telephone Encounter (Signed)
Patient scheduled with Dr. Posey Pronto 04/05/19 at 9:50 AM.

## 2019-03-07 NOTE — Telephone Encounter (Signed)
Spoke with patient and clarified IVIG schedule.

## 2019-03-07 NOTE — Telephone Encounter (Signed)
Patient left msg with after hours returning your call. Thanks!

## 2019-03-13 ENCOUNTER — Encounter: Payer: Self-pay | Admitting: Neurology

## 2019-03-20 ENCOUNTER — Telehealth: Payer: Self-pay | Admitting: Neurology

## 2019-03-20 NOTE — Telephone Encounter (Signed)
Patient called and requested a new infusion therapy company other than [facility]. He called this morning expressing much frustration about [facility] not keeping his appointment for this morning.

## 2019-03-20 NOTE — Telephone Encounter (Signed)
Patient left msg with after hours about MD wanting him to get the COVID shot. States he would like to know how to go about getting this. No current symptoms. Patient is scared about it and is immunocompromised. RN advised patient to contact the [facility] since they will be the ones distributing the vaccine. Phone # was given for them. Just FYI.  Thanks

## 2019-03-21 NOTE — Telephone Encounter (Signed)
Patient says he contacted [facility] and they never sent anyone.  Spoke with Biochemist, clinical and there was some miscommunication.  They will contact patient and try to work things out.

## 2019-04-05 ENCOUNTER — Ambulatory Visit: Payer: Medicaid Other | Admitting: Neurology

## 2019-04-13 ENCOUNTER — Ambulatory Visit: Payer: Medicaid Other | Admitting: Neurology

## 2019-04-23 ENCOUNTER — Telehealth: Payer: Self-pay | Admitting: Neurology

## 2019-04-23 NOTE — Telephone Encounter (Signed)
Yes. He should be getting it every 3 weeks.  You may need to call Asencion Partridge with [facility] and request their nurse contact patient.

## 2019-04-23 NOTE — Telephone Encounter (Signed)
Patient called requesting assistance with finding out when his next home infusion is.

## 2019-04-23 NOTE — Telephone Encounter (Signed)
Please advise, it looks like you order q3 weeks in dec. Does this continue?

## 2019-04-23 NOTE — Telephone Encounter (Signed)
I sent email to Cody Delacruz, awaiting response, then will notify patient.

## 2019-04-24 NOTE — Telephone Encounter (Signed)
Asencion Partridge will contact her office and nurse and they will reach out to patient and let him know and Dr. Posey Pronto.

## 2019-05-03 ENCOUNTER — Ambulatory Visit: Payer: Medicaid Other | Admitting: Neurology

## 2019-05-04 ENCOUNTER — Other Ambulatory Visit: Payer: Self-pay

## 2019-05-04 ENCOUNTER — Ambulatory Visit: Payer: Medicaid Other | Admitting: Neurology

## 2019-05-04 ENCOUNTER — Telehealth (INDEPENDENT_AMBULATORY_CARE_PROVIDER_SITE_OTHER): Payer: Medicaid Other | Admitting: Neurology

## 2019-05-04 DIAGNOSIS — G6181 Chronic inflammatory demyelinating polyneuritis: Secondary | ICD-10-CM | POA: Diagnosis not present

## 2019-05-04 DIAGNOSIS — G40909 Epilepsy, unspecified, not intractable, without status epilepticus: Secondary | ICD-10-CM

## 2019-05-04 DIAGNOSIS — G621 Alcoholic polyneuropathy: Secondary | ICD-10-CM | POA: Diagnosis not present

## 2019-05-04 NOTE — Progress Notes (Signed)
Due to the COVID-19 crisis, this virtual check-in visit was done via telephone from my office and it was initiated and consent given by this patient and or family.   Telephone (Audio) Visit The purpose of this telephone visit is to provide medical care while limiting exposure to the novel coronavirus.    Consent was obtained for telephone visit and initiated by pt/family:  Yes.   Answered questions that patient had about telehealth interaction:  Yes.   I discussed the limitations, risks, security and privacy concerns of performing an evaluation and management service by telephone. I also discussed with the patient that there may be a patient responsible charge related to this service. The patient expressed understanding and agreed to proceed.  Pt location: Home Physician Location: office Name of referring provider:  Alvester Chou, NP I connected with .Humberto Leep at patients initiation/request on 05/04/2019 at  9:30 AM EST by telephone and verified that I am speaking with the correct person using two identifiers.  Pt MRN:  [id number] Pt DOB:  February 24, 1966   History of Present Illness: This is a 54 year old man returning for follow-up of chronic inflammatory demyelinating polyneuropathy and seizure disorder.  He was scheduled for in office visit, however was unable to come to his appointment due to his transportation not being available, requested telephone visit.  Since his last visit in November, he has noticed worsening left leg weakness such that he is unable to extend the leg as easily as before.  He is able to raise the left leg up and does not have weakness in the foot.  His balance continues to be his primary problem and he falls frequently, despite using gait assistive devices.  His IVIG was increased to 1mg /kg every 21 days because of these changes, however he has not noticed any significant improvement.  He reports having 1 seizure 2 weeks ago.  He has been compliant with taking Keppra  750 mg twice daily.  He tells me that his PCP increased the dose to 1125mg  BID.    Assessment and Plan:   Chronic inflammatory polyradiculoneuropathy, motor variant (2019).  Symptoms manifested with bilateral foot drop and progressed to involve proximal leg weakness.  He responded well to IVIG and had improved leg strength at his last visit in November 2020.  Unfortunately, there has been no response to sensory ataxia which remains severe likely due to axonal injury to dorsal root ganglia, leaving permanent sensory deficits.  Over the past few months, he has had worsening left leg weakness and I will bring him for in office evaluation to be sure there is no other process contributing to this. Continue IVIG 1mg /kg every 21 days Precautions discussed, encouraged to predominantly use motorized chair  Seizure disorder, continue Keppra 1125mg  BID.  I will request PCP notes to see if there any any other indication for his keppra to be increased, at it would be reasonable to keep him on 1000mg  BID.  Alcohol abuse contributing to neuropathy, sensory ataxia, and vitamin deficiency.  He reports abstaining from alcohol.  Continue thiamine 100 mg daily.  Need for in person visit now:  Yes.     Return to clinic in 2 weeks.  Follow Up Instructions:   I discussed the assessment and treatment plan with the patient. The patient was provided an opportunity to ask questions and all were answered. The patient agreed with the plan and demonstrated an understanding of the instructions.   The patient was advised to call back or seek  an in-person evaluation if the symptoms worsen or if the condition fails to improve as anticipated.    Total Time spent in visit with the patient was:  20 min, of which 100% of the time was spent in counseling and/or coordinating care.   Pt understands and agrees with the plan of care outlined.     Alda Berthold, DO

## 2019-05-14 ENCOUNTER — Encounter: Payer: Self-pay | Admitting: Neurology

## 2019-05-14 ENCOUNTER — Other Ambulatory Visit: Payer: Self-pay

## 2019-05-14 ENCOUNTER — Ambulatory Visit (INDEPENDENT_AMBULATORY_CARE_PROVIDER_SITE_OTHER): Payer: Medicaid Other | Admitting: Neurology

## 2019-05-14 VITALS — BP 112/81 | HR 93 | Resp 16

## 2019-05-14 DIAGNOSIS — R29898 Other symptoms and signs involving the musculoskeletal system: Secondary | ICD-10-CM | POA: Diagnosis not present

## 2019-05-14 DIAGNOSIS — G6181 Chronic inflammatory demyelinating polyneuritis: Secondary | ICD-10-CM

## 2019-05-14 NOTE — Progress Notes (Signed)
Follow-up Visit   Date: 05/14/19    Cody Delacruz MRN: [id number] DOB: [birth year]   Interim History: Cody Delacruz is a 54 y.o. right-handed Caucasian male with alcohol abuse, tobacco use, depression, seizure disorder, GERD, alcoholic cirrhosis, and hyperlipidemia returning to the clinic for follow-up of polyradiculoneuropathy.  The patient was accompanied to the clinic by self.  History of present illness: Starting around early May 2019, he began having numbness and tingling of the feet and lower legs up to the level of the knees.  Over the next four weeks, he steadily developed greater difficulty with balance, weakness of the legs, and started having increased falls. He has been using a cane since 2011.  He was drinking (2) 24 oz beers daily x 10 years.  He usually uses a wheelchair because of chronic low back pain. In August 2019, he has NCS/EMG and CSF testing which was favoring polyradiculoneuropathy and therefore he was started on IVIG.  He began noticing distal improvement, however in early January 2020, he developed acute worsening of left leg weakness, to the point where he was unable to raise the leg.  MRI lumbar spine did not show nerve impingement.  CT of the head was without stroke.  He underwent repeat EMG in February 2020 which showed improved polyradiculoneuropathy distally, and interval worsening proximally.  He has history of generalized seizures which started in 2001.  He takes depakote 557m three times.  He is noncompliant with medications and gets breakthrough seizures. He typically gets seizure once per month which lasts 5 minutes.  The last time he saw a neurologist was [place]. He reports being toxic on a previous AED, but does not recall the name.  He does not drive.  Due to chronic thrombocytopenia, I stopped his Depakote and started him on Keppra 500 mg twice daily in late 2019. He has been complaint with Keppra and seizures are  less frequent, occurring once every 1 to 2 months.  UPDATE 02/05/2019: He is here for follow-up visit.  Continues to do well with respect to his leg weakness and no longer uses AFOs.  His major concern is ongoing imbalance, which there has been no improvement with despite IVIG and physical therapy.  He has not developed any new weakness of the hands or legs.  He continues to have numbness in the lower legs and feet.  He continues to avoid alcohol, although admits to having a beer once in a while.  He is falling about once every 2 months and is able to stand up by himself.  He had a breakthrough seizure in August and was seen in the emergency department (notes reviewed) where his Keppra was increased to 750 mg twice daily.  He denies skipping his medications or drinking alcohol when this occurred.    UPDATE 05/14/2019:  Starting around December, he began having weakness raising his left leg and extending his leg, similar to what he experienced in early 2020. At that time, repeat EMG showed improved findings distally and worse proximally in the leg.  He was continued on IVIG and did well with 5/5 strength in 01/2019.  His IVIG was increased to 1g every 21 days, previously q28, but no change in weakness.  Right leg is stable.  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  Medications:  Current Outpatient Medications on File Prior to Visit  Medication Sig Dispense Refill  . AMBULATORY NON FORMULARY MEDICATION Rollator WGilford Rile  1 Product 0  . Immune Globulin, Human, 0.5 GM/10ML SOLN Inject into the vein every 28 (twenty-eight) days.    Marland Kitchen levETIRAcetam (KEPPRA) 500 MG tablet Take 500 mg by mouth 2 (two) times daily.    Marland Kitchen lisinopril (PRINIVIL,ZESTRIL) 5 MG tablet Take 5 mg by mouth daily.   5  . LORazepam (ATIVAN) 0.5 MG tablet Take 0.5 mg by mouth daily.   1  . omeprazole (PRILOSEC) 20 MG capsule Take 20 mg by mouth daily.    . pravastatin (PRAVACHOL) 40 MG tablet Take 1 tablet (40 mg  total) by mouth daily. 90 tablet 3   No current facility-administered medications on file prior to visit.    Allergies:  No Known Allergies   Vital Signs:  BP 112/81 (BP Location: Left Arm, Patient Position: Sitting)   Pulse 93   Resp 16   SpO2 97%   Neurological Exam: MENTAL STATUS including orientation to time, place, person, recent and remote memory, attention span and concentration, language, and fund of knowledge is normal.  Speech is not dysarthric.  CRANIAL NERVES:    Face is symmetric.  MOTOR:  TA muscle bulk is improved, there is moderate bilateral quadriceps atrophy.   No pronator drift.  Tone is normal.    Right Upper Extremity:    Left Upper Extremity:    Deltoid  5/5   Deltoid  5/5   Biceps  5/5   Biceps  5/5   Triceps  5/5   Triceps  5/5   Wrist extensors  5/5   Wrist extensors  5/5   Wrist flexors  5/5   Wrist flexors  5/5   Finger extensors  5/5   Finger extensors  5/5   Finger flexors  5/5   Finger flexors  5/5   Dorsal interossei  5/5   Dorsal interossei  5/5   Abductor pollicis  5/5   Abductor pollicis  5/5   Tone (Ashworth scale)  0  Tone (Ashworth scale)  0   Right Lower Extremity:    Left Lower Extremity:    Hip flexors  5/5   Hip flexors  4/5   Hip extensors  5/5   Hip extensors  5/5   Knee flexors  5/5   Knee flexors  5/5   Knee extensors  5/5   Knee extensors  4/5   Dorsiflexors  5/5   Dorsiflexors  5/5   Plantarflexors  5/5   Plantarflexors  5/5   Toe extensors  5-/5   Toe extensors  4+/5   Toe flexors  5-/5   Toe flexors  4+/5   Tone (Ashworth scale)  0  Tone (Ashworth scale)  0   MSRs:                                           Right        Left brachioradialis 3+  3+  biceps 2+  2+  triceps 2+  2+  patellar 2+  2+  ankle jerk 0  0  Hoffman no  no  plantar response down  down   COORDINATION/GAIT:  Gait not tested, patient in motorized chair.   Data: MRI lumbar spine wo contrast 08/18/2017: 1. Degenerative change of the lumbar spine  without fracture or malalignment. 2. No canal stenosis. Mild to moderate RIGHT L5-S1 neural foraminal narrowing.  Labs 08/18/2017:  Vitamin B12  498, TSH 1.159, RPR neg, SPEP with IFE no M protein Labs 10/12/2017:  VPA <12, MMA 60, copper 70, vitamin B1 9, folate 16.5, ESR 8, CRP 0.1  NCS/EMG of the legs 10/25/2017:  The electrophysiologic findings are most consistent with a subacute and severe common peroneal neuropathy, demyelinating and axon loss in type, affecting bilateral lower extremities. Alternatively, a polyradiculoneuropathy can be considered given abnormal late responses.  Correlate clinically. There is no evidence of a large fiber sensorimotor polyneuropathy.  NCS/EMG of the legs 05/09/2018:  The electrophysiologic findings continue to show a polyradiculoneuropathy affecting bilateral lower extremities.  Compared to prior study on October 25, 2017, there has been a significant improvement below the knees, and interval worsening proximally.  CSF 11/14/2017:  R0 W0 P70 G67  No OCB, IgG index 0.44  Lyme neg, cytology neg  Lab Results  Component Value Date   CHOL 196 05/19/2016   HDL 60 05/19/2016   LDLCALC 105 (H) 05/19/2016   TRIG 154 (H) 05/19/2016   CHOLHDL 3.3 05/19/2016   MRI lumbar spine 04/21/2018: The dominant finding is central and rightward protrusion at L5-S1. Slight mass effect on the RIGHT S1 nerve root. No compressive foraminal narrowing.   IMPRESSION/PLAN: 1.  Polyradiculoneuropathy, motor variant of CIDP (2019).  Symptoms initially manifested with bilateral foot drop which then progressed with proximal leg weakness.  He initially responded very well to IVIG and had improved leg strength.  In 2020, he has a relapse with left proximal leg weakness, which improved slowly on IVIG over the next several months.  Today, he again presents with left proximal leg weakness which is 4/5 and previously 5/5, despite being on appropriate therapy with IVIG.    He continues to have severe  sensory ataxia leading to frequent falls which will be permenant neurological deficits. Given lack on going response to IVIG, I will switch him to solumedrol 1g x 5 days, then 1 g every 28 days for at least 4-6 months Steroid side effects discussed.  Start home physical therapy for leg strengthening         2.  Alcohol abuse contributing to neuropathy, sensory ataxia, and vitamin deficiency. He has tried to cut back on alcohol, now drinking 2 beers a few times per month. Continue thiamine 153m daily.  3.  Seizure disorder, continue keppra 11273mBID (recently adjusted by PCP). Medication compliance stressed.   Return to clinic in 6 months  Total time spent on day of encounter:  45 min  Thank you for allowing me to participate in patient's care.  If I can answer any additional questions, I would be pleased to do so.    Sincerely,    Jabar Krysiak K. PaPosey ProntoDO

## 2019-05-14 NOTE — Patient Instructions (Addendum)
We will stop IVIG and start home steroid infusion Start home physical therapy  Return to clinic in August 2021

## 2019-05-16 ENCOUNTER — Telehealth: Payer: Self-pay | Admitting: Neurology

## 2019-05-16 NOTE — Telephone Encounter (Signed)
Please see note.

## 2019-05-16 NOTE — Telephone Encounter (Signed)
1. Patient called requesting more information about his new infusion therapy. He said he is nervous and not sure what to expect.  2. Patient expressed concern he may need a prescription for Viagra or something like that to help with potential side effects of the new treatment.  [facility] on [address]

## 2019-05-16 NOTE — Telephone Encounter (Signed)
I attempted to contact patient on both numbers listed, but there was no answer.    If he calls back, please inform him to contact PCP for prescription for Viagra.  Steroids are generally well-tolerated and can sometimes increase energy, leading to insomnia and increased appetite, raise blood sugar and blood pressure, long term use may cause bone thinning, cataracts, but with short dose, he should be ok.  Side effects of short-term use are transient and improve.  Conroy Goracke K. Posey Pronto, DO

## 2019-05-17 ENCOUNTER — Telehealth: Payer: Self-pay

## 2019-05-17 NOTE — Telephone Encounter (Signed)
Discontinue order faxed to [facility] at [phone] ATTN to [facility] as patient is switching to [facility] infusions and switching to another medication reference OV

## 2019-05-29 ENCOUNTER — Telehealth: Payer: Self-pay | Admitting: Neurology

## 2019-05-29 ENCOUNTER — Encounter: Payer: Self-pay | Admitting: Neurology

## 2019-05-29 ENCOUNTER — Other Ambulatory Visit: Payer: Self-pay

## 2019-05-29 DIAGNOSIS — R29898 Other symptoms and signs involving the musculoskeletal system: Secondary | ICD-10-CM

## 2019-05-29 DIAGNOSIS — G6181 Chronic inflammatory demyelinating polyneuritis: Secondary | ICD-10-CM

## 2019-05-29 NOTE — Progress Notes (Signed)
Placing hold on IVIG and start IVMP through [facility].

## 2019-05-29 NOTE — Telephone Encounter (Signed)
Orders for therapy placed.  Patient aware.

## 2019-05-29 NOTE — Telephone Encounter (Signed)
Patient wants to speak to someone about Therapy no one has called him for the therapy

## 2019-05-30 ENCOUNTER — Telehealth: Payer: Self-pay | Admitting: Neurology

## 2019-05-30 NOTE — Telephone Encounter (Signed)
Called patient and discussed option of either doing solumedrol infusion at the hospital or continuing home infusion IVIG with another loading dose.  He prefers home infusion, so will proceed with reloading him with 2g/kg over 2-5 days, followed by 1g/kg every 21 days x 6 cycles.

## 2019-05-30 NOTE — Telephone Encounter (Signed)
Patient called regarding him getting a call from [facility] regarding his infusions (Steroids) and that the cost for a nurse to come out everyday for 5 days in a row would be $ 140 plus the cost of the medication. He also said that he thought it was through [place]. Please call. Thank you

## 2019-06-06 ENCOUNTER — Telehealth: Payer: Self-pay | Admitting: Neurology

## 2019-06-06 NOTE — Telephone Encounter (Signed)
Returned call, ok to infuse over 3 days.

## 2019-06-06 NOTE — Telephone Encounter (Signed)
[facility] called requesting a call back regarding starting this patient's infusions again at: "140 loaded infusion over a 3-4 day period."

## 2019-06-08 ENCOUNTER — Telehealth: Payer: Self-pay

## 2019-06-08 NOTE — Telephone Encounter (Signed)
Close encounter 

## 2019-06-08 NOTE — Telephone Encounter (Signed)
[facility] called in requesting auth for patient's PT.

## 2019-06-11 NOTE — Telephone Encounter (Signed)
Spoke with [facility]

## 2019-07-10 ENCOUNTER — Other Ambulatory Visit: Payer: Self-pay | Admitting: Neurology

## 2019-07-12 ENCOUNTER — Ambulatory Visit: Payer: Medicaid Other | Admitting: Neurology

## 2019-08-28 ENCOUNTER — Telehealth: Payer: Self-pay

## 2019-08-28 NOTE — Telephone Encounter (Signed)
Contacted [facility] to reach back out to PCP

## 2019-08-28 NOTE — Telephone Encounter (Signed)
Dr. Posey Pronto has been seeing him for his neuropathy. She will need to keep trying to contact PCP regarding these issues, and if he is being a danger to himself, then will need ER evaluation. Thanks

## 2019-08-28 NOTE — Telephone Encounter (Signed)
[facility] from [place] called in RN went to see this patient and he is extremely depressed and is drinking a lot more and is having more falls.. She cant reach the PCP and wanted to see if any thoughts from the neurology side. Please advise. Thanks.

## 2019-10-24 ENCOUNTER — Telehealth: Payer: Self-pay | Admitting: Neurology

## 2019-10-24 NOTE — Telephone Encounter (Signed)
Spoke with front desk person at [facility], they state they did not need to hear from [place] re his bed, it will be taken care of.

## 2019-10-24 NOTE — Telephone Encounter (Signed)
Patient called in and stated that his bed has broken where he lives and they won't replace his bed without a doctors note or someone calling them. Their phone number is [phone]. He is in room 308.

## 2019-10-24 NOTE — Telephone Encounter (Signed)
Spoke with pt who states the place where he lives said someone would come fix his bed but they still request a call for doctor's office. Attempted to call the number he provided but no answer.

## 2019-11-16 ENCOUNTER — Other Ambulatory Visit: Payer: Self-pay

## 2019-11-16 ENCOUNTER — Other Ambulatory Visit (INDEPENDENT_AMBULATORY_CARE_PROVIDER_SITE_OTHER): Payer: Medicaid Other

## 2019-11-16 ENCOUNTER — Ambulatory Visit (INDEPENDENT_AMBULATORY_CARE_PROVIDER_SITE_OTHER): Payer: Medicaid Other | Admitting: Neurology

## 2019-11-16 VITALS — BP 119/90 | HR 98 | Ht 69.0 in | Wt 145.0 lb

## 2019-11-16 DIAGNOSIS — G621 Alcoholic polyneuropathy: Secondary | ICD-10-CM

## 2019-11-16 DIAGNOSIS — G6181 Chronic inflammatory demyelinating polyneuritis: Secondary | ICD-10-CM

## 2019-11-16 DIAGNOSIS — E639 Nutritional deficiency, unspecified: Secondary | ICD-10-CM | POA: Diagnosis not present

## 2019-11-16 LAB — COMPREHENSIVE METABOLIC PANEL
ALT: 40 U/L (ref 0–53)
AST: 79 U/L — ABNORMAL HIGH (ref 0–37)
Albumin: 4.5 g/dL (ref 3.5–5.2)
Alkaline Phosphatase: 65 U/L (ref 39–117)
BUN: 12 mg/dL (ref 6–23)
CO2: 24 mEq/L (ref 19–32)
Calcium: 10.3 mg/dL (ref 8.4–10.5)
Chloride: 94 mEq/L — ABNORMAL LOW (ref 96–112)
Creatinine, Ser: 0.7 mg/dL (ref 0.40–1.50)
GFR: 116.55 mL/min (ref >60.00)
Glucose, Bld: 100 mg/dL — ABNORMAL HIGH (ref 70–99)
Potassium: 4 mEq/L (ref 3.5–5.1)
Sodium: 131 mEq/L — ABNORMAL LOW (ref 135–145)
Total Bilirubin: 0.7 mg/dL (ref 0.2–1.2)
Total Protein: 8.6 g/dL — ABNORMAL HIGH (ref 6.0–8.3)

## 2019-11-16 LAB — B12 AND FOLATE PANEL
Folate: 11.4 ng/mL (ref >5.9)
Vitamin B-12: 572 pg/mL (ref 211–911)

## 2019-11-16 NOTE — Patient Instructions (Signed)
Check labs  Adjust IVIG to every 28 days  Return to clinic in 6 months

## 2019-11-16 NOTE — Progress Notes (Signed)
Follow-up Visit   Date: 11/16/19    Cody Delacruz MRN: [id number] DOB: [birth year]   Interim History: Cody Delacruz is a 54 y.o. right-handed Caucasian male with alcohol abuse, tobacco use, depression, seizure disorder, GERD, alcoholic cirrhosis, and hyperlipidemia returning to the clinic for follow-up of polyradiculoneuropathy.  The patient was accompanied to the clinic by self.  History of present illness: Starting around early May 2019, he began having numbness and tingling of the feet and lower legs up to the level of the knees.  Over the next four weeks, he steadily developed greater difficulty with balance, weakness of the legs, and started having increased falls. He has been using a cane since 2011.  He was drinking (2) 24 oz beers daily x 10 years.  He usually uses a wheelchair because of chronic low back pain. In August 2019, he has NCS/EMG and CSF testing which was favoring polyradiculoneuropathy and therefore he was started on IVIG.  He began noticing distal improvement, however in early January 2020, he developed acute worsening of left leg weakness, to the point where he was unable to raise the leg.  MRI lumbar spine did not show nerve impingement.  CT of the head was without stroke.  He underwent repeat EMG in February 2020 which showed improved polyradiculoneuropathy distally, and interval worsening proximally.  His IVIG was continued and by the fall of 2020, his left weakness has improved to where he was no longer using an AFO. However, his imbalance has persisted.   He has history of generalized seizures which started in 2001.  He takes depakote 542m three times.  He is noncompliant with medications and gets breakthrough seizures. He typically gets seizure once per month which lasts 5 minutes.  The last time he saw a neurologist was [place] living in [place]. He reports being toxic on a previous AED, but does not recall the name.  He does not drive.  Due to  chronic thrombocytopenia, I stopped his Depakote and started him on Keppra 500 mg twice daily in late 2019. He has been complaint with Keppra and seizures are less frequent, occurring once every 1 to 2 months.   UPDATE 11/16/2019:  He is here for follow-up visit.  He reports that his balance is getting worse and when he attempts to stand, his legs start to shake.  He has fallen about 4 times over the past month, fortunately, no severe injuries.  No new numbness or tingling.  He denies any leg weakness, specifically no weakness raising the left leg or with extending his feet.   Upon further questioning, he is back to drinking alcohol (2) 24oz nightly.  He is aware this is not helping his neuropathy.    Medications:  Current Outpatient Medications on File Prior to Visit  Medication Sig Dispense Refill  . AMBULATORY NON FORMULARY MEDICATION Rollator Walker 1 Product 0  . Immune Globulin, Human, 0.5 GM/10ML SOLN Inject into the vein every 28 (twenty-eight) days.    .Marland KitchenlevETIRAcetam (KEPPRA) 500 MG tablet Take 1 tablet (500 mg total) by mouth 2 (two) times daily. 180 tablet 3  . lisinopril (PRINIVIL,ZESTRIL) 5 MG tablet Take 5 mg by mouth daily.   5  . LORazepam (ATIVAN) 0.5 MG tablet Take 0.5 mg by mouth daily.   1  . omeprazole (PRILOSEC) 20 MG capsule Take 20 mg by mouth daily.    . pravastatin (PRAVACHOL) 40 MG tablet Take 1 tablet (40 mg total) by mouth  daily. 90 tablet 3   No current facility-administered medications on file prior to visit.    Allergies:  No Known Allergies   Vital Signs:  BP 119/90 (BP Location: Left Arm, Patient Position: Sitting, Cuff Size: Small)   Pulse 98   Ht '5\' 9"'  (1.753 m)   Wt 145 lb (65.8 kg)   SpO2 97%   BMI 21.41 kg/m   Neurological Exam: MENTAL STATUS including orientation to time, place, person, recent and remote memory, attention span and concentration, language, and fund of knowledge is normal.  Speech is not dysarthric.  CRANIAL NERVES:    Face is  symmetric.  MOTOR:  TA muscle bulk is improved, there is moderate bilateral quadriceps atrophy.   No pronator drift.  Tone is normal.    Right Upper Extremity:    Left Upper Extremity:    Deltoid  5/5   Deltoid  5/5   Biceps  5/5   Biceps  5/5   Triceps  5/5   Triceps  5/5   Wrist extensors  5/5   Wrist extensors  5/5   Wrist flexors  5/5   Wrist flexors  5/5   Finger extensors  5/5   Finger extensors  5/5   Finger flexors  5/5   Finger flexors  5/5   Dorsal interossei  5/5   Dorsal interossei  5/5   Abductor pollicis  5/5   Abductor pollicis  5/5   Tone (Ashworth scale)  0  Tone (Ashworth scale)  0   Right Lower Extremity:    Left Lower Extremity:    Hip flexors  5/5   Hip flexors  5-/5   Hip extensors  5/5   Hip extensors  5/5   Knee flexors  5/5   Knee flexors  5/5   Knee extensors  5/5   Knee extensors  5-/5   Dorsiflexors  5/5   Dorsiflexors  5/5   Plantarflexors  5/5   Plantarflexors  5/5   Toe extensors  5-/5   Toe extensors  5-/5   Toe flexors  5-/5   Toe flexors  5-/5   Tone (Ashworth scale)  0  Tone (Ashworth scale)  0   MSRs:                                           Right        Left brachioradialis 3+  3+  biceps 2+  2+  triceps 2+  2+  patellar 2+  2+  ankle jerk 0  0  Hoffman no  no  plantar response down  down   COORDINATION/GAIT:  Gait not tested, patient in motorized chair.   Data: MRI lumbar spine wo contrast 08/18/2017: 1. Degenerative change of the lumbar spine without fracture or malalignment. 2. No canal stenosis. Mild to moderate RIGHT L5-S1 neural foraminal narrowing.  Labs 08/18/2017:  Vitamin B12 498, TSH 1.159, RPR neg, SPEP with IFE no M protein Labs 10/12/2017:  VPA <12, MMA 60, copper 70, vitamin B1 9, folate 16.5, ESR 8, CRP 0.1  NCS/EMG of the legs 10/25/2017:  The electrophysiologic findings are most consistent with a subacute and severe common peroneal neuropathy, demyelinating and axon loss in type, affecting bilateral lower  extremities. Alternatively, a polyradiculoneuropathy can be considered given abnormal late responses.  Correlate clinically. There is no evidence of a large fiber sensorimotor polyneuropathy.  NCS/EMG of the legs 05/09/2018:  The electrophysiologic findings continue to show a polyradiculoneuropathy affecting bilateral lower extremities.  Compared to prior study on October 25, 2017, there has been a significant improvement below the knees, and interval worsening proximally.  CSF 11/14/2017:  R0 W0 P70 G67  No OCB, IgG index 0.44  Lyme neg, cytology neg  Lab Results  Component Value Date   CHOL 196 05/19/2016   HDL 60 05/19/2016   LDLCALC 105 (H) 05/19/2016   TRIG 154 (H) 05/19/2016   CHOLHDL 3.3 05/19/2016   MRI lumbar spine 04/21/2018: The dominant finding is central and rightward protrusion at L5-S1. Slight mass effect on the RIGHT S1 nerve root. No compressive foraminal narrowing.   IMPRESSION/PLAN: 1.  Polyradiculoneuropathy, motor variant of CIDP (2019).  Symptoms initially manifested with bilateral foot drop which then progressed with proximal leg weakness.  He initially responded very well to IVIG and had improved leg strength.  In 2020, he has a relapse with left proximal leg weakness, which improved slowly on IVIG over the next several months. His motor strength is 5/5 today, with only trace weakness in the feet.  At this point, his CIDP is very well-controlled.  His ongoing imbalance is from severe sensory ataxia due alcohol neurotoxicity to his dorsal root ganglion.  Reduce IVIG to 29m/kg every 28 days         2.  Alcohol abuse contributing to neuropathy, sensory ataxia, and vitamin deficiency.   Patient counseled on abstaining from alcohol to prevent worsening neuropathy and balance.  Currently back to drinking 48 oz of beer per day  Check CMP, vitamin B12, vitamin B1, folate  3.  Seizure disorder, continue keppra 11222mBID (recently adjusted by PCP). Medication compliance  stressed.   Return to clinic in 6 months  Total time spent reviewing records, interview, history/exam, documentation, and coordination of care on day of encounter:  30 min   Thank you for allowing me to participate in patient's care.  If I can answer any additional questions, I would be pleased to do so.    Sincerely,    Tomara Youngberg K. PaPosey ProntoDO

## 2019-11-20 LAB — VITAMIN B1: Vitamin B1 (Thiamine): <6 nmol/L — ABNORMAL LOW (ref 8–30)

## 2019-11-22 ENCOUNTER — Telehealth: Payer: Self-pay | Admitting: Neurology

## 2019-11-22 NOTE — Telephone Encounter (Signed)
Patient called in after he received a voicemail that his results were in. He would like a call back.

## 2019-11-22 NOTE — Telephone Encounter (Signed)
Patient called again to get his results.

## 2019-11-23 MED ORDER — THIAMINE MONONITRATE 100 MG PO TABS: 100.0000 mg | ORAL_TABLET | Freq: Every day | ORAL | 0 refills | Status: AC

## 2019-11-23 NOTE — Telephone Encounter (Signed)
Patient called in again wanting to get the results of his lab test. He also stated his PCP Alvester Chou would like a copy of this lab work.

## 2019-11-23 NOTE — Telephone Encounter (Signed)
Patient notified of lab results and will start vitamin b1 100mg  once a day.   Alvester Chou, NP is patients pcp. Patient states she comes to his home. Patient states he has her phone number in his phone he will look it up and call me back.   Once patient calls back with the phone number I will contact Almyra Free for her fax.

## 2019-11-23 NOTE — Telephone Encounter (Signed)
Spoke with patient and gave him Dr Serita Grit recommendations with wife listening in on call.   Wife gave Cody Delacruz phone number, [phone], because she was requesting a copy of patients labs when she last spoke with him.  Will try to contact Cody Delacruz on Tuesday.

## 2019-11-23 NOTE — Telephone Encounter (Signed)
Pls inform patient that his thiamine is very low - start OTC vitamin B1 100mg  daily.  Please fax B12, folate, and B1 labs to PCP as FYI.  Thanks .

## 2019-11-27 NOTE — Telephone Encounter (Signed)
Patient called and left a message to check on the status of the request. 

## 2019-11-27 NOTE — Telephone Encounter (Signed)
Copy of labs faxed.

## 2019-11-27 NOTE — Telephone Encounter (Signed)
Spoke with Almyra Free and she gave me her fax number, copy of labs will be faxed over.   Spoke with patient and made him aware that I spoke with Almyra Free and will fax over his labs. Patient thanked me for calling and asked me to review lab results with him once again. Results reviewed and patient voiced understanding.

## 2020-01-03 ENCOUNTER — Telehealth: Payer: Self-pay | Admitting: Neurology

## 2020-01-03 NOTE — Telephone Encounter (Signed)
Called patient and left a message for a call back.  

## 2020-01-03 NOTE — Telephone Encounter (Signed)
Patient called and left a message that he'd like a call back from a nurse. He has questions about getting a service dog.

## 2020-05-16 ENCOUNTER — Telehealth: Payer: Self-pay | Admitting: Neurology

## 2020-05-16 NOTE — Telephone Encounter (Signed)
OK to stop infusions.  We can discuss more at his follow-up.

## 2020-05-16 NOTE — Telephone Encounter (Signed)
Patient wants to speak to someone about his medication. He does not think it is helping him. I was having a hard time hearing him so I did not get the name of the medication.   He has appt on 05-23-20

## 2020-05-16 NOTE — Telephone Encounter (Signed)
Called patient and he stated that he wants to stop his Infusions. Patient stated that he doesn't feel like it is working for him and there has been no improvement. Hs last treatment was last month.   Patient expressed that he knows that Dr. Posey Pronto will not like that he has stopped. Informed patient that I will send Dr. Posey Pronto a message to inform her and also reminded him of his f/u appt on / at 8:30am.   Patient is aware that if we do not call him back in regards to stopping treatment it is because we will discuss at his upcoming f/u on 3/4.

## 2020-05-23 ENCOUNTER — Ambulatory Visit (INDEPENDENT_AMBULATORY_CARE_PROVIDER_SITE_OTHER): Payer: Medicaid Other | Admitting: Neurology

## 2020-05-23 ENCOUNTER — Other Ambulatory Visit: Payer: Self-pay

## 2020-05-23 ENCOUNTER — Encounter: Payer: Self-pay | Admitting: Neurology

## 2020-05-23 VITALS — BP 158/115 | HR 110 | Ht 69.0 in | Wt 145.0 lb

## 2020-05-23 DIAGNOSIS — G40909 Epilepsy, unspecified, not intractable, without status epilepticus: Secondary | ICD-10-CM

## 2020-05-23 DIAGNOSIS — G621 Alcoholic polyneuropathy: Secondary | ICD-10-CM | POA: Diagnosis not present

## 2020-05-23 DIAGNOSIS — G6181 Chronic inflammatory demyelinating polyneuritis: Secondary | ICD-10-CM | POA: Diagnosis not present

## 2020-05-23 MED ORDER — LEVETIRACETAM 500 MG PO TABS: 500.0000 mg | ORAL_TABLET | Freq: Two times a day (BID) | ORAL | 3 refills | Status: DC

## 2020-05-23 NOTE — Progress Notes (Signed)
Follow-up Visit   Date: 05/23/20    Cody Delacruz MRN: [id number] DOB: 12-14-1965   Interim History: Cody Delacruz is a 55 y.o. 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  The patient was accompanied to the clinic by self.  History of present illness: Starting around early May 2019, he began having numbness and tingling of the feet and lower legs up to the level of the knees.  Over the next four weeks, he steadily developed greater difficulty with balance, weakness of the legs, and started having increased falls. He has been using a cane since 2011.  He was drinking (2) 24 oz beers daily x 10 years.  He usually uses a wheelchair because of chronic low back pain. In August 2019, he has NCS/EMG and CSF testing which was favoring polyradiculoneuropathy and therefore he was started on IVIG.  He began noticing distal improvement, however in early January 2020, he developed acute worsening of left leg weakness, to the point where he was unable to raise the leg.  MRI lumbar spine did not show nerve impingement.  CT of the head was without stroke.  He underwent repeat EMG in February 2020 which showed improved polyradiculoneuropathy distally, and interval worsening proximally.  His IVIG was continued and by the fall of 2020, his left weakness has improved to where he was no longer using an AFO. However, his imbalance has persisted.   He has history of generalized seizures which started in 2001.  He takes depakote 521m three times.  He is noncompliant with medications and gets breakthrough seizures. He typically gets seizure once per month which lasts 5 minutes.  The last time he [place] a neurologist [place]. He reports being toxic on a previous AED, but does not recall the  name.  He does not drive.  Due to chronic thrombocytopenia, I stopped his Depakote and started him on Keppra 500 mg twice daily in late 2019. He has been complaint with Keppra and seizures are less frequent, occurring once every 1 to 2 months.  UPDATE 05/23/2020:  He is here for follow-up.  He was getting IVIG until last month, after which he decied that it was not helping and stopped it.  He continues to have very poor balance, falling 1-2 times every few months.  There has not been any new leg weakness or worsening paresthesias.  He is frustrated that his left leg remains weak.  He continues to drink alcohol (2) 24 oz of beer daily, despite my strong counseling on abstaining.  No interval seizures.   Medications:  Current Outpatient Medications on File Prior to Visit  Medication Sig Dispense Refill  . AMBULATORY NON FORMULARY MEDICATION Rollator Walker 1 Product 0  . Immune Globulin, Human, 0.5 GM/10ML SOLN Inject into the vein every 28 (twenty-eight) days.    .Marland KitchenlevETIRAcetam (KEPPRA) 500 MG tablet Take 1 tablet (500 mg total) by mouth 2 (two) times daily. 180 tablet 3  . lisinopril (PRINIVIL,ZESTRIL) 5 MG tablet Take 5 mg by mouth daily.   5  . LORazepam (ATIVAN) 0.5 MG tablet Take 0.5 mg by mouth daily.   1  . omeprazole (PRILOSEC) 20 MG capsule Take 20 mg by mouth daily.    . pravastatin (PRAVACHOL) 40 MG tablet Take 1 tablet (40 mg total) by mouth daily. 90 tablet 3  .  thiamine (VITAMIN B-1) 100 MG tablet Take 1 tablet (100 mg total) by mouth daily. 30 tablet 0   No current facility-administered medications on file prior to visit.    Allergies:  No Known Allergies   Vital Signs:  BP (!) 158/115   Pulse (!) 110   Ht '5\' 9"'  (1.753 m)   Wt 145 lb (65.8 kg)   SpO2 98%   BMI 21.41 kg/m   Neurological Exam: MENTAL STATUS including orientation to time, place, person, recent and remote memory, attention span and concentration, language, and fund of knowledge is normal.  Speech is not  dysarthric.  CRANIAL NERVES:    Pupils round and reactive.  Extraocular muscles intact. Face is symmetric.  MOTOR:  Moderate bilateral quadriceps atrophy, worse on the left.   No pronator drift.  Tone is normal.    Right Upper Extremity:    Left Upper Extremity:    Deltoid  5/5   Deltoid  5/5   Biceps  5/5   Biceps  5/5   Triceps  5/5   Triceps  5/5   Wrist extensors  5/5   Wrist extensors  5/5   Wrist flexors  5/5   Wrist flexors  5/5   Finger extensors  5/5   Finger extensors  5/5   Finger flexors  5/5   Finger flexors  5/5   Dorsal interossei  5/5   Dorsal interossei  5/5   Abductor pollicis  5/5   Abductor pollicis  5/5   Tone (Ashworth scale)  0  Tone (Ashworth scale)  0   Right Lower Extremity:    Left Lower Extremity:    Hip flexors  5/5   Hip flexors  5-/5   Hip extensors  5/5   Hip extensors  5/5   Knee flexors  5/5   Knee flexors  5/5   Knee extensors  5/5   Knee extensors  5-/5   Dorsiflexors  5/5   Dorsiflexors  5/5   Plantarflexors  5/5   Plantarflexors  5/5   Toe extensors  5-/5   Toe extensors  5-/5   Toe flexors  5-/5   Toe flexors  5-/5   Tone (Ashworth scale)  0  Tone (Ashworth scale)  0   MSRs:                                           Right        Left brachioradialis 3+  3+  biceps 2+  2+  triceps 2+  2+  patellar 2+  2+  ankle jerk 0  0  Hoffman no  no  plantar response down  down   COORDINATION/GAIT:  Gait not tested, patient in motorized chair.   Data: MRI lumbar spine wo contrast 08/18/2017: 1. Degenerative change of the lumbar spine without fracture or malalignment. 2. No canal stenosis. Mild to moderate RIGHT L5-S1 neural foraminal narrowing.  Labs 08/18/2017:  Vitamin B12 498, TSH 1.159, RPR neg, SPEP with IFE no M protein Labs 10/12/2017:  VPA <12, MMA 60, copper 70, vitamin B1 9, folate 16.5, ESR 8, CRP 0.1  NCS/EMG of the legs 10/25/2017:  The electrophysiologic findings are most consistent with a subacute and severe common peroneal  neuropathy, demyelinating and axon loss in type, affecting bilateral lower extremities. Alternatively, a polyradiculoneuropathy can be considered given abnormal late responses.  Correlate  clinically. There is no evidence of a large fiber sensorimotor polyneuropathy.  NCS/EMG of the legs 05/09/2018:  The electrophysiologic findings continue to show a polyradiculoneuropathy affecting bilateral lower extremities.  Compared to prior study on October 25, 2017, there has been a significant improvement below the knees, and interval worsening proximally.  CSF 11/14/2017:  R0 W0 P70 G67  No OCB, IgG index 0.44  Lyme neg, cytology neg  Lab Results  Component Value Date   CHOL 196 05/19/2016   HDL 60 05/19/2016   LDLCALC 105 (H) 05/19/2016   TRIG 154 (H) 05/19/2016   CHOLHDL 3.3 05/19/2016   MRI lumbar spine 04/21/2018: The dominant finding is central and rightward protrusion at L5-S1. Slight mass effect on the RIGHT S1 nerve root. No compressive foraminal narrowing.   IMPRESSION/PLAN: 1.  Polyradiculoneuropathy, motor variant of CIDP (2019).  Symptoms initially manifested with bilateral foot drop which then progressed with proximal leg weakness.  He initially responded very well to IVIG and had improved leg strength.  In 2020, he has a relapse with left proximal leg weakness, which improved slowly on IVIG over the next several months. His exam remains stable as compared to his last visit.  He does not wish to be on IVIG at this time given no ongoing benefit, so it was mutually decided to stop therapy for now.  He has residual left leg weakness and severe ataxia, most of which is due to alcohol-induced injury to the DRG. Continue supportive care         2.  Alcohol abuse contributing to neuropathy, sensory ataxia, and vitamin deficiency.   Patient counseled on abstaining from alcohol to prevent worsening neuropathy and balance.   Continue vitamin B1 11m daily  3.  Seizure disorder, refilled for keppra  [id number]  Return to clinic in 6 months  Total time spent reviewing records, interview, history/exam, documentation, and coordination of care on day of encounter:  30 min      Thank you for allowing me to participate in patient's care.  If I can answer any additional questions, I would be pleased to do so.    Sincerely,     K. PaPosey ProntoDO

## 2020-05-23 NOTE — Patient Instructions (Signed)
We will discontinue IVIG   Try to stop alcohol - you can do it!  Return to clinic in 6 months

## 2020-06-23 ENCOUNTER — Other Ambulatory Visit: Payer: Self-pay | Admitting: Neurology

## 2020-06-26 ENCOUNTER — Other Ambulatory Visit: Payer: Self-pay | Admitting: Neurology

## 2020-07-18 ENCOUNTER — Encounter: Payer: Self-pay | Admitting: Gastroenterology

## 2020-08-11 ENCOUNTER — Ambulatory Visit: Payer: Medicaid Other | Admitting: Neurology

## 2020-08-30 ENCOUNTER — Other Ambulatory Visit: Payer: Self-pay | Admitting: Neurology

## 2020-10-29 ENCOUNTER — Encounter: Payer: Self-pay | Admitting: Neurology

## 2020-11-25 ENCOUNTER — Other Ambulatory Visit: Payer: Self-pay | Admitting: Neurology

## 2020-11-25 NOTE — Telephone Encounter (Signed)
Enough given until follow up wit  Dr.Patel 12/12/2020

## 2020-11-28 ENCOUNTER — Ambulatory Visit: Payer: Medicaid Other | Admitting: Neurology

## 2020-12-03 ENCOUNTER — Telehealth: Payer: Self-pay | Admitting: Neurology

## 2020-12-03 NOTE — Telephone Encounter (Signed)
Pt called in stating his son's employer wants some paperwork filled out for when he has to take off to take the patient to appointments. He is going to mail it in or fax it to [place].

## 2020-12-12 ENCOUNTER — Ambulatory Visit: Payer: Medicaid Other | Admitting: Neurology

## 2021-03-09 ENCOUNTER — Ambulatory Visit: Payer: Medicaid Other | Admitting: Neurology

## 2021-03-11 ENCOUNTER — Encounter: Payer: Self-pay | Admitting: Physician Assistant

## 2021-03-12 ENCOUNTER — Encounter: Payer: Self-pay | Admitting: Neurology

## 2021-03-12 ENCOUNTER — Other Ambulatory Visit: Payer: Self-pay | Admitting: Nurse Practitioner

## 2021-03-12 DIAGNOSIS — K7469 Other cirrhosis of liver: Secondary | ICD-10-CM

## 2021-03-12 DIAGNOSIS — F1010 Alcohol abuse, uncomplicated: Secondary | ICD-10-CM

## 2021-03-12 DIAGNOSIS — R748 Abnormal levels of other serum enzymes: Secondary | ICD-10-CM

## 2021-03-13 ENCOUNTER — Telehealth (INDEPENDENT_AMBULATORY_CARE_PROVIDER_SITE_OTHER): Payer: Medicaid Other | Admitting: Neurology

## 2021-03-13 ENCOUNTER — Other Ambulatory Visit: Payer: Self-pay

## 2021-03-13 VITALS — Ht 69.0 in | Wt 150.0 lb

## 2021-03-13 DIAGNOSIS — G621 Alcoholic polyneuropathy: Secondary | ICD-10-CM | POA: Diagnosis not present

## 2021-03-13 DIAGNOSIS — G40909 Epilepsy, unspecified, not intractable, without status epilepticus: Secondary | ICD-10-CM

## 2021-03-13 DIAGNOSIS — G6181 Chronic inflammatory demyelinating polyneuritis: Secondary | ICD-10-CM | POA: Diagnosis not present

## 2021-03-13 MED ORDER — LEVETIRACETAM 500 MG PO TABS: 500.0000 mg | ORAL_TABLET | Freq: Two times a day (BID) | ORAL | 3 refills | Status: DC

## 2021-03-13 MED ORDER — GABAPENTIN 300 MG PO CAPS: 300.0000 mg | ORAL_CAPSULE | Freq: Every day | ORAL | 3 refills | Status: AC

## 2021-03-13 NOTE — Progress Notes (Signed)
Virtual Visit via Video Note The purpose of this virtual visit is to provide medical care while limiting exposure to the novel coronavirus.    Consent was obtained for video visit:  Yes.   Answered questions that patient had about telehealth interaction:  Yes.   I discussed the limitations, risks, security and privacy concerns of performing an evaluation and management service by telemedicine. I also discussed with the patient that there may be a patient responsible charge related to this service. The patient expressed understanding and agreed to proceed.  Pt location: Home Physician Location: office Name of referring provider:  Alvester Chou, NP I connected with Cody Delacruz at patients initiation/request on 03/13/2021 at 10:50 AM EST by video enabled telemedicine application and verified that I am speaking with the correct person using two identifiers. Pt MRN:  [id number] Pt DOB:  1965/07/22 Video Participants:  Cody Delacruz;  patient's son   History of Present Illness: This is a 55 y.o. male returning for follow-up of CIDP, alcoholic neuropathy, and seizure disorder.  He has been off IVIG for almost the past year and denies any new weakness. His left leg has remained weak and unchanged.  He complains more of achy pain in the leg as well as tingling.  No new weakness in the arms or the right leg.  He uses a motorized chair, but has had several falls despite this.  He is also being seen by liver transplant team for cirrhosis.  However, he continues to drink a sixpack of beer nightly which may make him not a candidate for this. He has not had any interval seizures.  He remains compliant with Keppra 500mg  BID.   Observations/Objective:   Vitals:   03/12/21 1436  Weight: 150 lb (68 kg)  Height: 5\' 9"  (1.753 m)   Patient is awake, alert, and appears comfortable.  Oriented x 4.   Extraocular muscles are intact. No ptosis.  Face is symmetric.  Speech is not dysarthric.  Antigravity in all  extremities,except LLE.  . Gait appears normal patient is wheelchair-bound.   Assessment and Plan: 1.  Polyradiculopathy, motor variant of CIDP, diagnosed 2019.  Symptoms initially manifested with bilateral foot Drop and proximal leg weakness.  He responded very well to IVIG with improved leg strength.  However, in 2020 he had another relapse which again improved on IVIG.  Since this time he has residual left leg weakness, which is severe.  Despite repeat IVIG, leg weakness did not improve.  Early in 2022, it was mutually decided to discontinue IVIG due to no ongoing benefit.  He has residual left leg weakness and severe ataxia, the latter which is due to alcoholic injury to the dorsal root ganglion.  He does report painful paresthesias today for which I will start him on gabapentin 300 mg at bedtime. Continue supportive care  2.  Alcohol abuse contributing to neuropathy, sensory ataxia, and vitamin deficiency.   Patient counseled on abstaining from alcohol to prevent worsening neuropathy and balance.   Continue vitamin B1 100mg  daily  3.  Seizure disorder, refilled for keppra 1000mg  BID  Follow Up Instructions:   I discussed the assessment and treatment plan with the patient. The patient was provided an opportunity to ask questions and all were answered. The patient agreed with the plan and demonstrated an understanding of the instructions.   The patient was advised to call back or seek an in-person evaluation if the symptoms worsen or if the condition fails to improve as anticipated.  Follow-up in 1 year  Total time spent:  30 minutes     Alda Berthold, DO

## 2021-03-18 ENCOUNTER — Encounter: Payer: Self-pay | Admitting: Gastroenterology

## 2021-03-26 ENCOUNTER — Telehealth: Payer: Self-pay | Admitting: Gastroenterology

## 2021-03-26 NOTE — Telephone Encounter (Signed)
Good Afternoon Dr. Havery Moros,  Patient called and stated that he wanted to schedule his colonoscopy. I noticed that he had a recall for a Endo from 03/22/20.  Would you like for him to have those procedures at the same time? Please advise on scheduling. Thank you.

## 2021-03-26 NOTE — Telephone Encounter (Signed)
He is due for both EGD and colonoscopy and can be done a the same time, but I have not seen him for a while and would be best to see me in clinic first to reassess and can schedule these at that time. He should have up to date labs done - CBC, INR, LFTs, AFP, and a RUQ [place] as well. I have an office opening tomorrow AM at 810 if he wants to come then, I know it is short notice but someone just cancelled. Thanks

## 2021-04-02 ENCOUNTER — Ambulatory Visit
Admission: RE | Admit: 2021-04-02 | Discharge: 2021-04-02 | Disposition: A | Discharge status: Home / Self Care | Payer: Medicaid Other | Source: Ambulatory Visit | Attending: Nurse Practitioner | Admitting: Nurse Practitioner

## 2021-04-02 DIAGNOSIS — R748 Abnormal levels of other serum enzymes: Secondary | ICD-10-CM

## 2021-04-02 DIAGNOSIS — K7469 Other cirrhosis of liver: Secondary | ICD-10-CM

## 2021-04-02 DIAGNOSIS — F1010 Alcohol abuse, uncomplicated: Secondary | ICD-10-CM

## 2021-05-01 ENCOUNTER — Telehealth: Payer: Self-pay | Admitting: Neurology

## 2021-05-01 NOTE — Telephone Encounter (Signed)
Patient called and is requesting Dr. Serita Grit opinion about whether the water at [facility] contributed to his leg paralysis.  He said he received a letter from an attorney that said the water was not linked to his disability, he believes that is wrong.

## 2021-05-01 NOTE — Telephone Encounter (Signed)
No, his neuropathy would not be related to this.

## 2021-05-01 NOTE — Telephone Encounter (Signed)
Tried calling pt again with no answer

## 2021-05-01 NOTE — Telephone Encounter (Signed)
Pt called no answer left a voice mail to call the office back  °

## 2021-05-04 NOTE — Telephone Encounter (Signed)
Pt called back in returning Heather's call 

## 2021-05-04 NOTE — Telephone Encounter (Signed)
Called patient and informed him that his Neuropathy is not related to the [facility]. Patient verbalized understanding and had no further questions or concerns.

## 2021-05-28 ENCOUNTER — Encounter: Payer: Self-pay | Admitting: Physician Assistant

## 2021-06-16 ENCOUNTER — Encounter: Payer: Self-pay | Admitting: Physician Assistant

## 2021-06-16 ENCOUNTER — Ambulatory Visit (INDEPENDENT_AMBULATORY_CARE_PROVIDER_SITE_OTHER): Payer: Medicaid Other | Admitting: Physician Assistant

## 2021-06-16 VITALS — HR 88 | Ht 69.0 in | Wt 140.0 lb

## 2021-06-16 DIAGNOSIS — Z86010 Personal history of colonic polyps: Secondary | ICD-10-CM

## 2021-06-16 DIAGNOSIS — K7460 Unspecified cirrhosis of liver: Secondary | ICD-10-CM

## 2021-06-16 DIAGNOSIS — Z860101 Personal history of adenomatous and serrated colon polyps: Secondary | ICD-10-CM

## 2021-06-16 NOTE — Patient Instructions (Addendum)
If you are age 58 or younger, your body mass index should be between 19-25. Your Body mass index is 20.67 kg/m?Marland Kitchen If this is out of the aformentioned range listed, please consider follow up with your Primary Care Provider.  ?________________________________________________________ ? ?The [facility] providers would like to encourage you to use [facility] to communicate with providers for non-urgent requests or questions.  Due to long hold times on the telephone, sending your provider a message by [facility] may be a faster and more efficient way to get a response.  Please allow 48 business hours for a response.  Please remember that this is for non-urgent requests.  ?_______________________________________________________ ? ?We will call you about a potential date for you Endoscopy/ Colonoscopy.  ? ?Follow up pending ? ?Thank you for entrusting me with your care and choosing [facility]. ? ?Nicoletta Ba, PA-C  ?

## 2021-06-16 NOTE — Progress Notes (Addendum)
? ?Subjective:  ? ? Patient ID: Cody Delacruz, male    DOB: [birth year], 56 y.o.   MRN: [id number] ? ?HPI ?Cody Delacruz is a 56 year old white male, established with Dr. Havery Moros.  He was last seen in 2020.  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  He is status post treatment for hep C with eradication.  History of ongoing EtOH use, chronic pain syndrome, seizure disorder, and has been diagnosed with C IDP (chronic inflammatory demyelinating polyneuropathy) and is now wheelchair-bound. ?He last had colonoscopy in December 2019 with removal of 11 polyps in total, the largest was 7 mm, noted to have multiple diverticuli and internal hemorrhoids.  Cath biopsies showed tubular adenomas and hyperplastic polyps and one inflammatory polyp.  He was indicated for 3-year interval follow-up. ?EGD in January 2020 with finding of a 2 cm hiatal hernia, no varices. ? ?By review of his chart he has been established with [facility], and had been seen there in December 2022.  He had multiple labs done, I cannot review the results in care everywhere today. ?He also had upper abdominal ultrasound done in January 2023 showing minimal nodularity of the liver, increased echogenicity patent portal vein and spleen not visualized. ? ?He denies any acute GI symptoms today.  Denies any difficulty with heartburn or indigestion, no dysphagia or odynophagia, no abdominal pain, no changes in bowel habits, no melena or hematochezia.  He does admit to continued tobacco use and continued EtOH use with 2-3 beers per day. ? ?Review of Systems Pertinent positive and negative review of systems were noted in the above HPI section.  All other review of systems was otherwise negative.  ?Outpatient Encounter Medications as of 06/16/2021  ?Medication Sig  ? AMBULATORY NON FORMULARY MEDICATION Rollator Walker  ? gabapentin (NEURONTIN) 300 MG capsule Take 1 capsule (300 mg total) by mouth at bedtime.  ?  levETIRAcetam (KEPPRA) 500 MG tablet Take 1 tablet (500 mg total) by mouth 2 (two) times daily.  ? lisinopril (PRINIVIL,ZESTRIL) 5 MG tablet Take 5 mg by mouth daily.   ? LORazepam (ATIVAN) 0.5 MG tablet Take 0.5 mg by mouth daily.   ? omeprazole (PRILOSEC) 20 MG capsule Take 20 mg by mouth daily.  ? pravastatin (PRAVACHOL) 40 MG tablet Take 1 tablet (40 mg total) by mouth daily.  ? thiamine (VITAMIN B-1) 100 MG tablet Take 1 tablet (100 mg total) by mouth daily.  ? traZODone (DESYREL) 100 MG tablet Take by mouth.  ? ?No facility-administered encounter medications on file as of 06/16/2021.  ? ?Allergies  ?Allergen Reactions  ? Codeine Other (See Comments)  ?  Unknown childhood allergy  ? ?Patient Active Problem List  ? Diagnosis Date Noted  ? Dupuytren's contracture 11/30/2018  ? Seizures ([place])   ? GI bleed 04/20/2017  ? Rectal bleeding 04/19/2017  ? Colitis 04/19/2017  ? Rectal bleed 11/18/2016  ? Dental abscess 09/01/2016  ? Moderate episode of recurrent major depressive disorder ([place]) 09/01/2016  ? Smoking addiction 09/01/2016  ? Depression 08/08/2016  ? Tobacco abuse 08/08/2016  ? Alcohol abuse 08/08/2016  ? Seizure ([place]) 08/08/2016  ? Alcoholic intoxication with complication ([place])   ? Numbness and tingling in both hands 01/22/2016  ? Chronic pain syndrome 01/22/2016  ? Assistance needed for mobility 01/22/2016  ? Healthcare maintenance 03/10/2015  ? Dyslipidemia 08/26/2014  ? Bilateral low back pain with left-sided sciatica 02/21/2014  ? Hepatitis C antibody test positive 05/28/2013  ? GERD (gastroesophageal  reflux disease) 05/01/2013  ? Seizure disorder ([place]) 05/01/2013  ? Neck pain, chronic 10/31/2012  ? Chronic bilateral low back pain with left-sided sciatica 10/31/2012  ? ?Social History  ? ?Socioeconomic History  ? Marital status: Single  ?  Spouse name: Not on file  ? Number of children: 3  ? Years of education: 33  ? Highest education level: Not on file  ?Occupational History  ? Occupation: disabled   ?Tobacco Use  ? Smoking status: Every Day  ?  Packs/day: 0.25  ?  Years: 40.00  ?  Pack years: 10.00  ?  Types: Cigarettes  ? Smokeless tobacco: Never  ? Tobacco comments:  ?  smoking 5 cigs/day (he rolls his own)  ?Vaping Use  ? Vaping Use: Never used  ?Substance and Sexual Activity  ? Alcohol use: Yes  ?  Alcohol/week: 0.0 standard drinks  ?  Comment: pt states "2 24oz daily when I can"  ? Drug use: Yes  ?  Types: Marijuana  ? Sexual activity: Not on file  ?Other Topics Concern  ? Not on file  ?Social History Narrative  ? He has been on disability since 2017.  He was previously painting billboards. He lives in a one story home.    ?   ? Right Handed  ? Drinks a soda every once in awhile.  ? Drinks Iced Tea and Milk  ? ?Social Determinants of Health  ? ?Financial Resource Strain: Not on file  ?Food Insecurity: Not on file  ?Transportation Needs: Not on file  ?Physical Activity: Not on file  ?Stress: Not on file  ?Social Connections: Not on file  ?Intimate Partner Violence: Not on file  ? ? ?Mr. Boylen's family history includes Brain cancer in his brother; Cancer in his maternal grandmother; Cancer (age of onset: 37) in his mother; Colonic polyp in his father; Emphysema in his father; Leukemia (age of onset: 49) in his sister; Lung cancer in his father; Lung cancer (age of onset: 79) in his brother. ? ? ?   ?Objective:  ?  ?Vitals:  ? 06/16/21 1110  ?Pulse: 88  ? ? ?Physical Exam.Well-developed chronically ill-appearing older white male in no acute distress.  Patient is in a wheelchair, here alone today , Weight, 140 BMI 20.6 ? ?HEENT; nontraumatic normocephalic, EOMI, PE R LA, sclera anicteric. ?Oropharynx; not examined today ?Neck; supple, no JVD ?Cardiovascular; regular rate and rhythm with S1-S2, no murmur rub or gallop ?Pulmonary; Clear bilaterally ?Abdomen; soft, nontender, nondistended, no palpable mass or hepatosplenomegaly, bowel sounds are active ?Rectal; not done today ?Skin; benign exam, no jaundice  rash or appreciable lesions ?Extremities; no clubbing cyanosis or edema skin warm and dry ?Neuro/Psych; alert and oriented x4, mood appropriate, extensive neuro testing not done, patient with progressive demyelinating polyneuropathy ? ? ? ?   ?Assessment & Plan:  ? ?#76 56 year old white male with history of compensated cirrhosis secondary to hep C/EtOH.  Hepatitis C has been treated and eradicated, he does have continued EtOH use though by his report much less than in the past. ? ?No evidence for varices at last EGD January 2020. ?Ultrasound January 2023 with minimal nodularity of the liver ?Multiple baseline labs were done through [facility] December 2022, including chemistries, INR, AFP.  We have requested copies of these as cannot visualize results in care everywhere ? ?#2 history of multiple colon polyps adenomatous and hyperplastic with 11 polyps removed at last colonoscopy December 2019-overdue for follow-up ?#3 internal hemorrhoids ?4.  Diverticulosis ?  5.  Chronic GERD ?6.  Seizure disorder ?7.  Chronic pain syndrome ?8.  History of depression ?9.  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 ?Plan; continue omeprazole 20 mg p.o. every morning ?Have requested copies of all of his most recent labs from [facility]-he should be up-to-date with AFP/INR etc. ?Patient will be scheduled for colonoscopy and EGD with Dr. Havery Moros.  Both procedures were discussed in detail with the patient including indications risk and benefits and he is agreeable to proceed.  Procedures will need to be scheduled at the hospital as patient is now wheelchair-bound.  He does feel that he can complete a bowel prep at home, we discussed this. ?(No hospital availability for Dr. Havery Moros in [place] will need to be placed on the list for procedure in July) ? ?Addendum-labs from [facility] December 2022 albumin 5.1 ?T. bili 0.4/alk phos 78/AST 127/ALT  46 ?ANA negative ?ANCA negative ?Antichromatin antibody IVIG less than 20 ?Antiliver kidney antibody less than 20 ?Antimitochondrial antibody less than 20 ?Antisoluble liver antigen less than 20 iron studies norm

## 2021-06-16 NOTE — Progress Notes (Signed)
Agree with assessment and plan as outlined. Due to limited availability for hospital anesthesia support, case will likely be done in July for more elective cases.  ?

## 2021-07-28 ENCOUNTER — Other Ambulatory Visit: Payer: Self-pay

## 2021-07-28 ENCOUNTER — Telehealth: Payer: Self-pay

## 2021-07-28 DIAGNOSIS — K7460 Unspecified cirrhosis of liver: Secondary | ICD-10-CM

## 2021-07-28 DIAGNOSIS — Z86010 Personal history of colonic polyps: Secondary | ICD-10-CM

## 2021-07-28 NOTE — Telephone Encounter (Signed)
Lm on vm for patient to return call. ? ?Need to discuss [facility] appt. ?

## 2021-07-28 NOTE — Progress Notes (Signed)
ECL at [facility] on Thursday, 10-08-21 with Dr. Havery Moros ?

## 2021-07-29 NOTE — Telephone Encounter (Signed)
Amy do you recall this patient. Per your note the patient seemed to think he could prep at home, but do you recall if you spoke to him about admission for bowel prep? Not sure if the hospital is allowing that yet, would need to check. Thanks for any clarification ?

## 2021-07-29 NOTE — Telephone Encounter (Signed)
Called and spoke with patient regarding EGD/colon at [facility] on 10/08/21. Pt states that this appt is fine. He has been scheduled for an in person pre-visit appt on Thursday, 08/25/21 at 9 am. Pt states that his son will accompany him to that appt to make sure he does not miss anything. Pt is aware that I will mail him a copy of his appt information. Pt confirmed address on file. Pt verbalized understanding and had no concerns at ht need of the call.  ?

## 2021-07-30 NOTE — Telephone Encounter (Signed)
Lm on mobile vm for patient to return call °

## 2021-08-03 NOTE — Telephone Encounter (Signed)
Lm on vm for patient to return call 

## 2021-08-04 NOTE — Telephone Encounter (Signed)
Called and spoke with patient. He reports that he will be able to complete prep at home. He is aware that he will receive written instructions when he comes in for his PV appt in July. Pt verbalized understanding and had no concerns at the end of the call. ?

## 2021-09-24 ENCOUNTER — Telehealth: Payer: Self-pay | Admitting: *Deleted

## 2021-09-24 NOTE — Telephone Encounter (Signed)
I also attempted to reach patient on his mobile phone twice. His line rings then goes to a fast busy signal. Pt is scheduled for colon and EGD with banding at [facility] on 10/08/21 for hx of adenomatous polyps and cirrhosis. Please advise. Thanks

## 2021-09-24 NOTE — Telephone Encounter (Signed)
Thanks [employer].  Can you try to call this patient back tomorrow and then on Monday to see if we can get a hold of him.  I note his son is also listed as a point of contact if you want to try calling his son who may be able to coordinate with you. Hopefully we can keep him on the list for his procedures on July 20.  If he does not answer our calls and respond by sometime next week he should be canceled and someone else can use that slot as procedures are very difficult to find time for the hospital. I will be out of the office next week, hopefully if he can reach the patient's son or him in the next few days, we can coordinate if he will use the slot or someone else.  Thanks

## 2021-09-24 NOTE — Telephone Encounter (Signed)
Patient no show/answer PV appointment for today. I called patient x3, no answer,no voice mail for both phone numbers listed. Will try again later.

## 2021-09-24 NOTE — Telephone Encounter (Signed)
Called patient again, still no answer.

## 2021-09-25 NOTE — Telephone Encounter (Signed)
I attempted to reach pt on his mobile #, the line rings and goes to a fast busy signal.  I also attempted to reach pt's son, Devan, his line also rings and goes to a fast busy signal. Will continue efforts to reach patient next week.

## 2021-09-28 NOTE — Telephone Encounter (Signed)
Attempted to reach patient and his son this morning. Both phone lines ring and go to a fast busy signal.

## 2021-09-29 NOTE — Telephone Encounter (Signed)
Letter mailed to patient about missed appt.

## 2021-09-29 NOTE — Telephone Encounter (Signed)
No return call received. 7/20 [facility] case has been cancelled.

## 2021-10-08 ENCOUNTER — Ambulatory Visit (HOSPITAL_COMMUNITY): Admit: 2021-10-08 | Payer: Medicaid Other | Admitting: Gastroenterology

## 2021-10-08 ENCOUNTER — Encounter (HOSPITAL_COMMUNITY): Payer: Self-pay

## 2021-10-08 SURGERY — COLONOSCOPY WITH PROPOFOL
Anesthesia: Monitor Anesthesia Care

## 2021-11-26 ENCOUNTER — Other Ambulatory Visit: Payer: Self-pay | Admitting: Neurology

## 2021-12-04 ENCOUNTER — Other Ambulatory Visit: Payer: Self-pay | Admitting: Neurology

## 2022-01-22 ENCOUNTER — Telehealth: Payer: Self-pay

## 2022-01-22 NOTE — Telephone Encounter (Signed)
Called and spoke to patient.  His ECL in July was cancelled. Called to schedule a F/U for reassessment and to get rescheduled at hosp for [place]. Patient scheduled for OV with dr. Havery Moros on 11-20 at 11:00am Patient in a wheelchair

## 2022-02-08 ENCOUNTER — Telehealth: Payer: Self-pay

## 2022-02-08 ENCOUNTER — Ambulatory Visit: Payer: Medicaid Other | Admitting: Gastroenterology

## 2022-02-08 NOTE — Telephone Encounter (Signed)
Patient NO SHOWED his office visit to discuss rescheduling his EGD/Colonoscopy at the hospital. Patient will need to contact us to reschedule to discuss a procedure.

## 2022-02-18 NOTE — Telephone Encounter (Signed)
Patient called to reschedule his appt. He states he has memory loss and doesn't remember things anymore. He would like someone to call and remind him of upcoming appt scheduled on 04/13/22 at 3pm with Dr.Armbruster.

## 2022-02-26 ENCOUNTER — Ambulatory Visit: Payer: Medicaid Other | Admitting: Neurology

## 2022-02-26 ENCOUNTER — Encounter: Payer: Self-pay | Admitting: Neurology

## 2022-03-19 ENCOUNTER — Telehealth: Payer: Self-pay | Admitting: Neurology

## 2022-03-19 NOTE — Telephone Encounter (Signed)
Patient dismissed from [facility] and all providers practicing at this clinic. 02/26/22

## 2022-04-08 ENCOUNTER — Telehealth: Payer: Self-pay

## 2022-04-08 NOTE — Telephone Encounter (Signed)
-----  Message from Roetta Sessions, [place] sent at 02/18/2022  5:54 PM EST ----- Regarding: remind pt of appt Patient called to reschedule his appt. He states he has memory loss and doesn't remember things anymore. He would like someone to call and remind him of upcoming appt scheduled on Tuesday, 04/13/22 at 3pm with Dr.Armbruster.

## 2022-04-08 NOTE — Telephone Encounter (Signed)
Called and spoke to patient.  He is aware

## 2022-04-13 ENCOUNTER — Ambulatory Visit: Payer: Medicaid Other | Admitting: Gastroenterology

## 2022-04-13 NOTE — Progress Notes (Deleted)
HPI : seen 06/16/21: 57 year old white male, established with Dr. Havery Moros.  He was last seen in 2020.  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  He is status post treatment for hep C with eradication.  History of ongoing EtOH use, chronic pain syndrome, seizure disorder, and has been diagnosed with C IDP (chronic inflammatory demyelinating polyneuropathy) and is now wheelchair-bound. He last had colonoscopy in December 2019 with removal of 11 polyps in total, the largest was 7 mm, noted to have multiple diverticuli and internal hemorrhoids.  Cath biopsies showed tubular adenomas and hyperplastic polyps and one inflammatory polyp.  He was indicated for 3-year interval follow-up. EGD in January 2020 with finding of a 2 cm hiatal hernia, no varices.   By review of his chart he has been established with [facility], and had been seen there in December 2022.  He had multiple labs done, I cannot review the results in care everywhere today. He also had upper abdominal ultrasound done in January 2023 showing minimal nodularity of the liver, increased echogenicity patent portal vein and spleen not visualized.   He denies any acute GI symptoms today.  Denies any difficulty with heartburn or indigestion, no dysphagia or odynophagia, no abdominal pain, no changes in bowel habits, no melena or hematochezia.  He does admit to continued tobacco use and continued EtOH use with 2-3 beers per day.    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  Hepatitis C has been treated and eradicated, he does have continued EtOH use though by his report much less than in the past.   No evidence for varices at last EGD January 2020. Ultrasound January 2023 with minimal nodularity of the liver Multiple baseline labs were done through [facility] December 2022, including chemistries, INR, AFP.  We have requested copies of  these as cannot visualize results in care everywhere   #2 history of multiple colon polyps adenomatous and hyperplastic with 11 polyps removed at last colonoscopy December 2019-overdue for follow-up #3 internal hemorrhoids 4.  Diverticulosis 5.  Chronic GERD 6.  Seizure disorder 7.  Chronic pain syndrome 8.  History of depression 9.  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 every morning Have requested copies of all of his most recent labs from [facility]-he should be up-to-date with AFP/INR etc. Patient will be scheduled for colonoscopy and EGD with Dr. Havery Moros.  Both procedures were discussed in detail with the patient including indications risk and benefits and he is agreeable to proceed.  Procedures will need to be scheduled at the hospital as patient is now wheelchair-bound.  He does feel that he can complete a bowel prep at home, we discussed this. (No hospital availability for Dr. Havery Moros in [place] will need to be placed on the list for procedure in July)   Addendum-labs from [facility] December 2022 albumin 5.1 T. bili 0.4/alk phos 78/AST 127/ALT 46 ANA negative ANCA negative Antichromatin antibody IVIG less than 20 Antiliver kidney antibody less than 20 Antimitochondrial antibody less than 20 Antisoluble liver antigen less than 20 iron studies normal Pro time 10.9/INR 1.0 AFP 7.8 Mitochondrial antibody less than 20 HIV negative Ceruloplasmin 16.1 Ferritin 74    Past Medical History:  Diagnosis Date   Anxiety    Arthritis    Back pain    CIDP (chronic inflammatory demyelinating polyneuropathy) ([place])    does  therapy at house every 3 weeks per patient. immunotherapy. Next one is 03/29/2018   Cirrhosis ([place])    due to combination of hep C and alcohol   Colon polyp    Depression    Epilepsy, unspecified, not intractable, without status epilepticus  ([place])    Epilepsy, unspecified, not intractable, without status epilepticus ([place])    GERD (gastroesophageal reflux disease)    Hemorrhoids    History of hepatitis C    did 12 month of therapy per patient    Hypercholesteremia 03/2017   210   Hyperlipidemia    Hypertension    Neck pain    Other acariasis    Hep C   PTSD (post-traumatic stress disorder)    Rectal bleeding    Seizures ([place])    last seizure approx 03/22/2018   Tubular adenoma of colon 2017     Past Surgical History:  Procedure Laterality Date   COLONOSCOPY  2017   [facility]. Dr Newman Pies. Said they could see 15 polpys but didn't remove them per patient    ESOPHAGOGASTRODUODENOSCOPY     in [place]. before 2009 per patient   ganglion cyst removal     HERNIA REPAIR     inguinal per patient    KNEE ARTHROSCOPY Bilateral    Knee   TOTAL KNEE ARTHROPLASTY Bilateral    Family History  Problem Relation Age of Onset   Cancer Mother 66       myosarcoma, ovarian   Lung cancer Father    Emphysema Father    Colonic polyp Father    Leukemia Sister 62   Lung cancer Brother 62   Brain cancer Brother    Cancer Maternal Grandmother    Colon cancer Neg Hx    Colon polyps Neg Hx    Esophageal cancer Neg Hx    Rectal cancer Neg Hx    Stomach cancer Neg Hx    Social History   Tobacco Use   Smoking status: Every Day    Packs/day: 0.25    Years: 40.00    Total pack years: 10.00    Types: Cigarettes   Smokeless tobacco: Never   Tobacco comments:    smoking 5 cigs/day (he rolls his own)  Vaping Use   Vaping Use: Never used  Substance Use Topics   Alcohol use: Yes    Alcohol/week: 0.0 standard drinks of alcohol    Comment: pt states "2 24oz daily when I can"   Drug use: Yes    Types: Marijuana   Current Outpatient Medications  Medication Sig Dispense Refill   AMBULATORY NON FORMULARY MEDICATION Rollator Walker 1 Product 0   gabapentin (NEURONTIN) 300 MG capsule Take 1 capsule (300 mg total) by mouth  at bedtime. 90 capsule 3   levETIRAcetam (KEPPRA) 500 MG tablet Take 1 tablet (500 mg total) by mouth 2 (two) times daily. 180 tablet 0   lisinopril (PRINIVIL,ZESTRIL) 5 MG tablet Take 5 mg by mouth daily.   5   LORazepam (ATIVAN) 0.5 MG tablet Take 0.5 mg by mouth daily.   1   omeprazole (PRILOSEC) 20 MG capsule Take 20 mg by mouth daily.     pravastatin (PRAVACHOL) 40 MG tablet Take 1 tablet (40 mg total) by mouth daily. 90 tablet 3   thiamine (VITAMIN B-1) 100 MG tablet Take 1 tablet (100 mg total) by mouth daily. 30 tablet 0   traZODone (DESYREL) 100 MG tablet Take by mouth.     No current facility-administered  medications for this visit.   Allergies  Allergen Reactions   Codeine Other (See Comments)    Unknown childhood allergy     Review of Systems: All systems reviewed and negative except where noted in HPI.    No results found.  Physical Exam: There were no vitals taken for this visit. Constitutional: Pleasant,well-developed, ***male in no acute distress. HEENT: Normocephalic and atraumatic. Conjunctivae are normal. No scleral icterus. Neck supple.  Cardiovascular: Normal rate, regular rhythm.  Pulmonary/chest: Effort normal and breath sounds normal. No wheezing, rales or rhonchi. Abdominal: Soft, nondistended, nontender. Bowel sounds active throughout. There are no masses palpable. No hepatomegaly. Extremities: no edema Lymphadenopathy: No cervical adenopathy noted. Neurological: Alert and oriented to person place and time. Skin: Skin is warm and dry. No rashes noted. Psychiatric: Normal mood and affect. Behavior is normal.   ASSESSMENT: 57 y.o. male here for assessment of the following  No diagnosis found.  PLAN:   Alvester Chou, NP

## 2022-08-20 ENCOUNTER — Emergency Department (HOSPITAL_COMMUNITY)
Admission: EM | Admit: 2022-08-20 | Discharge: 2022-08-20 | Disposition: A | Discharge status: Home / Self Care | Payer: Medicaid Other | Attending: Emergency Medicine | Admitting: Emergency Medicine

## 2022-08-20 ENCOUNTER — Other Ambulatory Visit: Payer: Self-pay

## 2022-08-20 ENCOUNTER — Encounter (HOSPITAL_COMMUNITY): Payer: Self-pay

## 2022-08-20 DIAGNOSIS — R200 Anesthesia of skin: Secondary | ICD-10-CM | POA: Diagnosis present

## 2022-08-20 DIAGNOSIS — R030 Elevated blood-pressure reading, without diagnosis of hypertension: Secondary | ICD-10-CM

## 2022-08-20 DIAGNOSIS — I10 Essential (primary) hypertension: Secondary | ICD-10-CM | POA: Diagnosis not present

## 2022-08-20 DIAGNOSIS — Z79899 Other long term (current) drug therapy: Secondary | ICD-10-CM | POA: Insufficient documentation

## 2022-08-20 DIAGNOSIS — M720 Palmar fascial fibromatosis [Dupuytren]: Secondary | ICD-10-CM

## 2022-08-20 NOTE — ED Triage Notes (Signed)
Pt adds that left hand is numb and has been numb for 2 days.

## 2022-08-20 NOTE — Discharge Instructions (Addendum)
Follow-up with your [facility] listed above to have your hand evaluated.  You need to call make an appointment.  Your blood pressure was elevated.  This should be rechecked by your primary care doctor.  Make sure that you are taking your blood pressure medication.

## 2022-08-20 NOTE — ED Triage Notes (Signed)
Pt states his home health nurse sent him over and he is not sure why he is here. C/o not being able to straighten fifth finger on left hand. Pt straightened finger when assessing him. C/o pain in the finger and left hand. Pt is alert and orientedx4, but did state the incorrect year. Pt denies any other symptoms.

## 2022-08-20 NOTE — ED Provider Notes (Signed)
Cody Delacruz EMERGENCY DEPARTMENT AT [facility] Provider Note   CSN: [id number] Arrival date & time: 08/20/22  1539     History  Chief Complaint  Patient presents with   Hand Pain   Numbness    Cody Delacruz is a 57 y.o. male.  Patient is a 57 year old male who presents with inability to completely straighten his left pinky finger.  He said he has had problems with the finger before and had surgery on his hand to release the tendon.  On chart review, it looks like he had Dupuytren's contracture of the left pinky finger and had surgery to release the tendon.  He says this is been going on for about the last week.  He has some numbness to his hands bilaterally.  He denies any recent injury to the hand.  He does note history of alcohol use and says he drank 2 beers this morning.  He does have a known history of alcoholic cirrhosis and hepatitis.  He also has a history of hypertension.  Denies any other acute complaints.       Home Medications Prior to Admission medications   Medication Sig Start Date End Date Taking? Authorizing Provider  AMBULATORY NON FORMULARY MEDICATION Rollator Walker 10/30/18   Nita Sickle K, DO  gabapentin (NEURONTIN) 300 MG capsule Take 1 capsule (300 mg total) by mouth at bedtime. 03/13/21   Patel, Roxana Hires K, DO  levETIRAcetam (KEPPRA) 500 MG tablet Take 1 tablet (500 mg total) by mouth 2 (two) times daily. 11/26/21   Patel, Roxana Hires K, DO  lisinopril (PRINIVIL,ZESTRIL) 5 MG tablet Take 5 mg by mouth daily.  07/04/17   [provider]  LORazepam (ATIVAN) 0.5 MG tablet Take 0.5 mg by mouth daily.  07/28/17   [provider]  omeprazole (PRILOSEC) 20 MG capsule Take 20 mg by mouth daily.    [provider]  pravastatin (PRAVACHOL) 40 MG tablet Take 1 tablet (40 mg total) by mouth daily. 05/19/16   Quentin Angst, MD  thiamine (VITAMIN B-1) 100 MG tablet Take 1 tablet (100 mg total) by mouth daily. 11/23/19   Patel, Roxana Hires K, DO   traZODone (DESYREL) 100 MG tablet Take by mouth.    [provider]      Allergies    Codeine    Review of Systems   Review of Systems  Constitutional:  Negative for fever.  Gastrointestinal:  Negative for nausea and vomiting.  Musculoskeletal:  Positive for arthralgias. Negative for back pain, joint swelling and neck pain.  Skin:  Negative for wound.  Neurological:  Positive for numbness. Negative for weakness and headaches.    Physical Exam Updated Vital Signs BP (!) 167/100 (BP Location: Left Arm)   Pulse 92   Temp 98.2 F (36.8 C) (Oral)   Resp 20   SpO2 98%  Physical Exam Constitutional:      Comments: Disheveled  Cardiovascular:     Rate and Rhythm: Normal rate.  Pulmonary:     Effort: Pulmonary effort is normal.  Musculoskeletal:     Comments: At rest, he has flexion of his left fifth finger.  He is able to straighten the finger but then it goes back to flexion.  Capillary refill is intact in all the fingers.  Radial pulse intact.  He has intact sensation light touch in the hand although he subjectively feels numbness in the hand.  Skin:    General: Skin is warm and dry.  Neurological:  Mental Status: He is alert.     ED Results / Procedures / Treatments   Labs (all labs ordered are listed, but only abnormal results are displayed) Labs Reviewed - No data to display  EKG None  Radiology No results found.  Procedures Procedures    Medications Ordered in ED Medications - No data to display  ED Course/ Medical Decision Making/ A&P                             Medical Decision Making  Patient has contraction of the left pinky finger.  It is consistent with Dupuytren's contracture which he has had previously in that finger.  He has no evidence of poor perfusion in the hand.  No swelling or signs of infection.  No recent injury that would warrant imaging.  Will refer him back to the hand surgeon that saw him previously.  Was placed in a  finger splint.  His blood pressure is noted to be a bit elevated.  Will refer him back to his primary care doctor to have his blood pressure rechecked.  Final Clinical Impression(s) / ED Diagnoses Final diagnoses:  Dupuytren's contracture of hand  Elevated blood pressure reading    Rx / DC Orders ED Discharge Orders     None         Rolan Bucco, MD 08/20/22 1746
